# Patient Record
Sex: Male | Born: 1945
Health system: Southern US, Community
[De-identification: ages and names within clinical notes are randomized; demographics above are authoritative.]

## PROBLEM LIST (undated history)

## (undated) DIAGNOSIS — N189 Chronic kidney disease, unspecified: Secondary | ICD-10-CM

## (undated) DIAGNOSIS — K5730 Diverticulosis of large intestine without perforation or abscess without bleeding: Secondary | ICD-10-CM

## (undated) DIAGNOSIS — I10 Essential (primary) hypertension: Secondary | ICD-10-CM

## (undated) DIAGNOSIS — C801 Malignant (primary) neoplasm, unspecified: Secondary | ICD-10-CM

## (undated) DIAGNOSIS — E785 Hyperlipidemia, unspecified: Secondary | ICD-10-CM

## (undated) DIAGNOSIS — J383 Other diseases of vocal cords: Secondary | ICD-10-CM

## (undated) DIAGNOSIS — E119 Type 2 diabetes mellitus without complications: Secondary | ICD-10-CM

## (undated) DIAGNOSIS — R7301 Impaired fasting glucose: Secondary | ICD-10-CM

## (undated) HISTORY — DX: Hyperlipidemia, unspecified: E78.5

## (undated) HISTORY — DX: Malignant (primary) neoplasm, unspecified: C80.1

## (undated) HISTORY — DX: Impaired fasting glucose: R73.01

## (undated) HISTORY — PX: COLONOSCOPY W/ POLYPECTOMY: SHX1380

## (undated) HISTORY — DX: Diverticulosis of large intestine without perforation or abscess without bleeding: K57.30

## (undated) HISTORY — DX: Essential (primary) hypertension: I10

## (undated) MED FILL — Cetuximab IV Soln 200 MG/100ML (2 MG/ML): INTRAVENOUS | Qty: 350 | Status: AC

## (undated) MED FILL — Cetuximab IV Soln 200 MG/100ML (2 MG/ML): INTRAVENOUS | Qty: 200 | Status: AC

---

## 1993-03-27 HISTORY — PX: OTHER SURGICAL HISTORY: SHX169

## 2003-03-28 HISTORY — PX: OTHER SURGICAL HISTORY: SHX169

## 2004-04-20 ENCOUNTER — Ambulatory Visit: Payer: Self-pay | Admitting: Internal Medicine

## 2005-06-15 ENCOUNTER — Ambulatory Visit: Payer: Self-pay | Admitting: Internal Medicine

## 2005-07-17 ENCOUNTER — Encounter (INDEPENDENT_AMBULATORY_CARE_PROVIDER_SITE_OTHER): Payer: Self-pay | Admitting: *Deleted

## 2005-07-17 ENCOUNTER — Ambulatory Visit (HOSPITAL_COMMUNITY): Admission: RE | Admit: 2005-07-17 | Discharge: 2005-07-17 | Payer: Self-pay | Admitting: *Deleted

## 2005-09-01 ENCOUNTER — Ambulatory Visit: Payer: Self-pay | Admitting: Internal Medicine

## 2005-09-08 ENCOUNTER — Ambulatory Visit: Payer: Self-pay | Admitting: Cardiology

## 2006-03-14 ENCOUNTER — Ambulatory Visit: Payer: Self-pay | Admitting: Internal Medicine

## 2006-03-14 LAB — CONVERTED CEMR LAB
BUN: 17 mg/dL (ref 6–23)
Cholesterol: 207 mg/dL (ref 0–200)
LDL DIRECT: 158.1 mg/dL
Triglyceride fasting, serum: 111 mg/dL (ref 0–149)

## 2006-04-04 ENCOUNTER — Ambulatory Visit: Payer: Self-pay | Admitting: Internal Medicine

## 2006-04-13 ENCOUNTER — Ambulatory Visit: Payer: Self-pay | Admitting: Internal Medicine

## 2006-04-13 LAB — CONVERTED CEMR LAB
Total CK: 60 units/L (ref 7–232)
Troponin I: 0.01 ng/mL (ref <0.06)

## 2007-03-08 ENCOUNTER — Ambulatory Visit: Payer: Self-pay | Admitting: Internal Medicine

## 2007-03-08 DIAGNOSIS — E785 Hyperlipidemia, unspecified: Secondary | ICD-10-CM | POA: Insufficient documentation

## 2007-03-08 DIAGNOSIS — Z86010 Personal history of colonic polyps: Secondary | ICD-10-CM

## 2007-03-08 DIAGNOSIS — I10 Essential (primary) hypertension: Secondary | ICD-10-CM

## 2007-03-25 ENCOUNTER — Encounter (INDEPENDENT_AMBULATORY_CARE_PROVIDER_SITE_OTHER): Payer: Self-pay | Admitting: *Deleted

## 2007-04-24 ENCOUNTER — Ambulatory Visit: Payer: Self-pay | Admitting: Internal Medicine

## 2007-04-25 ENCOUNTER — Encounter (INDEPENDENT_AMBULATORY_CARE_PROVIDER_SITE_OTHER): Payer: Self-pay | Admitting: *Deleted

## 2007-07-01 ENCOUNTER — Ambulatory Visit: Payer: Self-pay | Admitting: Internal Medicine

## 2007-07-03 ENCOUNTER — Telehealth (INDEPENDENT_AMBULATORY_CARE_PROVIDER_SITE_OTHER): Payer: Self-pay | Admitting: *Deleted

## 2007-07-09 ENCOUNTER — Telehealth (INDEPENDENT_AMBULATORY_CARE_PROVIDER_SITE_OTHER): Payer: Self-pay | Admitting: *Deleted

## 2007-07-15 ENCOUNTER — Telehealth (INDEPENDENT_AMBULATORY_CARE_PROVIDER_SITE_OTHER): Payer: Self-pay | Admitting: *Deleted

## 2008-01-03 ENCOUNTER — Ambulatory Visit: Payer: Self-pay | Admitting: Internal Medicine

## 2008-01-03 DIAGNOSIS — T783XXA Angioneurotic edema, initial encounter: Secondary | ICD-10-CM | POA: Insufficient documentation

## 2008-01-06 ENCOUNTER — Telehealth: Payer: Self-pay | Admitting: Internal Medicine

## 2008-02-26 ENCOUNTER — Ambulatory Visit: Payer: Self-pay | Admitting: Gastroenterology

## 2008-03-11 ENCOUNTER — Ambulatory Visit: Payer: Self-pay | Admitting: Gastroenterology

## 2008-03-11 LAB — HM COLONOSCOPY

## 2008-11-10 ENCOUNTER — Ambulatory Visit: Payer: Self-pay | Admitting: Internal Medicine

## 2008-11-10 LAB — CONVERTED CEMR LAB
Blood in Urine, dipstick: NEGATIVE
Glucose, Urine, Semiquant: NEGATIVE
Hgb A1c MFr Bld: 5.9 % (ref 4.6–6.5)
Ketones, urine, test strip: NEGATIVE
Protein, U semiquant: NEGATIVE
Specific Gravity, Urine: <1.005
Urobilinogen, UA: 0.2
WBC Urine, dipstick: NEGATIVE

## 2008-11-15 LAB — CONVERTED CEMR LAB
Albumin: 4.2 g/dL (ref 3.5–5.2)
Alkaline Phosphatase: 83 units/L (ref 39–117)
Calcium: 9.2 mg/dL (ref 8.4–10.5)
Chloride: 109 meq/L (ref 96–112)
HCT: 44.3 % (ref 39.0–52.0)
Lymphocytes Relative: 24.5 % (ref 12.0–46.0)
MCHC: 34.2 g/dL (ref 30.0–36.0)
MCV: 90.4 fL (ref 78.0–100.0)
Neutrophils Relative %: 62 % (ref 43.0–77.0)
PSA: 1.59 ng/mL (ref 0.10–4.00)
RBC: 4.9 M/uL (ref 4.22–5.81)
Sodium: 144 meq/L (ref 135–145)
Triglycerides: 136 mg/dL (ref 0.0–149.0)
WBC: 5.8 10*3/uL (ref 4.5–10.5)

## 2008-11-16 ENCOUNTER — Encounter (INDEPENDENT_AMBULATORY_CARE_PROVIDER_SITE_OTHER): Payer: Self-pay | Admitting: *Deleted

## 2008-11-19 ENCOUNTER — Ambulatory Visit: Payer: Self-pay | Admitting: Internal Medicine

## 2008-11-19 DIAGNOSIS — E118 Type 2 diabetes mellitus with unspecified complications: Secondary | ICD-10-CM

## 2008-11-19 DIAGNOSIS — Z87442 Personal history of urinary calculi: Secondary | ICD-10-CM

## 2008-11-19 DIAGNOSIS — K5730 Diverticulosis of large intestine without perforation or abscess without bleeding: Secondary | ICD-10-CM | POA: Insufficient documentation

## 2008-11-19 DIAGNOSIS — Z85828 Personal history of other malignant neoplasm of skin: Secondary | ICD-10-CM

## 2009-12-30 ENCOUNTER — Ambulatory Visit: Payer: Self-pay | Admitting: Internal Medicine

## 2009-12-30 LAB — CONVERTED CEMR LAB
Albumin: 4.2 g/dL (ref 3.5–5.2)
Basophils Absolute: 0 10*3/uL (ref 0.0–0.1)
Basophils Relative: 0.7 % (ref 0.0–3.0)
Blood in Urine, dipstick: NEGATIVE
Direct LDL: 157.4 mg/dL
Glucose, Urine, Semiquant: NEGATIVE
Hemoglobin: 15.2 g/dL (ref 13.0–17.0)
Ketones, urine, test strip: NEGATIVE
Lymphs Abs: 1.5 10*3/uL (ref 0.7–4.0)
Monocytes Absolute: 0.6 10*3/uL (ref 0.1–1.0)
Monocytes Relative: 10.3 % (ref 3.0–12.0)
Neutro Abs: 3.4 10*3/uL (ref 1.4–7.7)
Nitrite: NEGATIVE
PSA: 1.87 ng/mL (ref 0.10–4.00)
Protein, U semiquant: NEGATIVE
RBC: 4.92 M/uL (ref 4.22–5.81)
Specific Gravity, Urine: 1.01
TSH: 1.9 microintl units/mL (ref 0.35–5.50)
Total Bilirubin: 1.1 mg/dL (ref 0.3–1.2)
Total CHOL/HDL Ratio: 6
Total Protein: 6.8 g/dL (ref 6.0–8.3)
WBC Urine, dipstick: NEGATIVE

## 2010-01-07 ENCOUNTER — Encounter: Payer: Self-pay | Admitting: Internal Medicine

## 2010-01-07 ENCOUNTER — Ambulatory Visit: Payer: Self-pay | Admitting: Internal Medicine

## 2010-01-07 DIAGNOSIS — R9431 Abnormal electrocardiogram [ECG] [EKG]: Secondary | ICD-10-CM

## 2010-03-18 ENCOUNTER — Telehealth (INDEPENDENT_AMBULATORY_CARE_PROVIDER_SITE_OTHER): Payer: Self-pay | Admitting: *Deleted

## 2010-04-20 ENCOUNTER — Other Ambulatory Visit: Payer: Self-pay | Admitting: Internal Medicine

## 2010-04-20 ENCOUNTER — Ambulatory Visit
Admission: RE | Admit: 2010-04-20 | Discharge: 2010-04-20 | Payer: Self-pay | Source: Home / Self Care | Attending: Internal Medicine | Admitting: Internal Medicine

## 2010-04-20 LAB — LIPID PANEL
Cholesterol: 172 mg/dL (ref 0–200)
Total CHOL/HDL Ratio: 4
VLDL: 22.4 mg/dL (ref 0.0–40.0)

## 2010-04-20 LAB — HEMOGLOBIN A1C: Hgb A1c MFr Bld: 6.4 % (ref 4.6–6.5)

## 2010-04-20 LAB — HEPATIC FUNCTION PANEL
AST: 25 U/L (ref 0–37)
Bilirubin, Direct: 0.1 mg/dL (ref 0.0–0.3)
Total Protein: 7.1 g/dL (ref 6.0–8.3)

## 2010-04-20 LAB — CK: Total CK: 83 U/L (ref 7–232)

## 2010-04-20 LAB — CREATININE, SERUM: Creatinine, Ser: 1 mg/dL (ref 0.4–1.5)

## 2010-04-20 LAB — POTASSIUM: Potassium: 5 mEq/L (ref 3.5–5.1)

## 2010-04-24 LAB — CONVERTED CEMR LAB
Albumin: 4.1 g/dL (ref 3.5–5.2)
BUN: 18 mg/dL (ref 6–23)
Basophils Absolute: 0 10*3/uL (ref 0.0–0.1)
Basophils Relative: 0.5 % (ref 0.0–1.0)
CO2: 30 meq/L (ref 19–32)
Calcium: 9.4 mg/dL (ref 8.4–10.5)
Cholesterol, target level: 200 mg/dL
Cholesterol: 201 mg/dL (ref 0–200)
Direct LDL: 151.3 mg/dL
Eosinophils Absolute: 0.1 10*3/uL (ref 0.0–0.6)
GFR calc non Af Amer: 80 mL/min
Glucose, Bld: 102 mg/dL — ABNORMAL HIGH (ref 70–99)
HCT: 44.1 % (ref 39.0–52.0)
HDL: 34.5 mg/dL — ABNORMAL LOW (ref >39.0)
LDL Goal: 100 mg/dL
Lymphocytes Relative: 28 % (ref 12.0–46.0)
Monocytes Absolute: 0.4 10*3/uL (ref 0.2–0.7)
RDW: 12.2 % (ref 11.5–14.6)
Sodium: 142 meq/L (ref 135–145)
TSH: 1.9 microintl units/mL (ref 0.35–5.50)
VLDL: 27 mg/dL (ref 0–40)

## 2010-04-27 ENCOUNTER — Ambulatory Visit: Payer: Self-pay | Admitting: Internal Medicine

## 2010-04-28 NOTE — Assessment & Plan Note (Signed)
Summary: CPX,LABS PRIOR,MEDICARE/RH......   Vital Signs:  Patient profile:   65 year old male Height:      68.25 inches Weight:      174.8 pounds BMI:     26.48 Temp:     98.4 degrees F oral Pulse rate:   60 / minute Resp:     14 per minute BP sitting:   130 / 80  (left arm) Cuff size:   large  Vitals Entered By: Shonna Chock CMA (January 07, 2010 11:01 AM) CC: CPX and discuss labs , Lipid Management   CC:  CPX and discuss labs  and Lipid Management.  History of Present Illness: Here for Medicare AWV: 1.Risk factors based on Past M, S, F history:HTN;Dyslipidemia ( chart updated) 2.Physical Activities: walking 45 min 5X/week for 45 min w/o symptoms ( see EKG) 3.Depression/mood: no issues 4.Hearing: normal to whisper @ 6 ft 5.ADL's: no limitations 6.Fall Risk: no imbalance; home safety proofed 7.Home Safety: no issues 8.Height, weight, &visual acuity:read wall chart @ 6 ftw/o lenses 9.Counseling: none requested ;  Living Will & POA  in place   10.Labs ordered based on risk factors: results reviewed & risks discussed 11. Referral Coordination: none requested  12.Care Plan: see Instructions 13. Cognitive Assessment: memory & recall ntact; Oriented X 3 ; mood & affect noraml.Spelling & math reflects educational level. Hyperlipidemia Follow-Up      This is a 65 year old man who presents for Hyperlipidemia follow-up.  The patient denies muscle aches, GI upset, abdominal pain, flushing, itching, constipation, diarrhea, and fatigue.  The patient denies the following symptoms: chest pain/pressure, exercise intolerance, dypsnea, palpitations, syncope, and pedal edema.  Statin ws never started. Adjunctive measures currently used by the patient include ASA. Hypertension Follow-Up      The patient also presents for Hypertension follow-up.  The patient denies lightheadedness and urinary frequency.  Compliance with medications (by patient report) has been near 100%.  Adjunctive measures  currently used by the patient include salt restriction.  BP @ home up to 159/99; BP was normal @ Dentist's office.  Lipid Management History:      Positive NCEP/ATP III risk factors include male age 82 years old or older, HDL cholesterol less than 40, and hypertension.  Negative NCEP/ATP III risk factors include non-diabetic, no family history for ischemic heart disease, non-tobacco-user status, no ASHD (atherosclerotic heart disease), no prior stroke/TIA, no peripheral vascular disease, and no history of aortic aneurysm.     Preventive Screening-Counseling & Management  Alcohol-Tobacco     Alcohol drinks/day: 0     Packs/Day: 2 ppd     Year Started: 1960     Year Quit: 1970  Caffeine-Diet-Exercise     Caffeine use/day: 2-3 cups/ day     Diet Comments: no specific diet  Hep-HIV-STD-Contraception     Dental Visit-last 6 months yes     Sun Exposure-Excessive: no  Safety-Violence-Falls     Seat Belt Use: yes     Smoke Detectors: yes      Blood Transfusions:  no.        Travel History:  no foreign travel.    Allergies: 1)  ! * Unknown Bp Med  Past History:  Past Medical History: Colonic polyps, PMH  of, Dr Jarold Motto Hypertension hematuria X1 ,? due to Nephrolithiasis Diverticulosis, colon Skin cancer, PMH  of, ?Basal Cell, nasal Hyperlipidemia: Fr[place]ham Study LDL goal = < 100.NMR Lipoprofile : LDL 165(2796/2526), HDL 31, TG 202. LDL goal = < 100.  Past Surgical History: cataract left eye benign skin lesions on scalp( pilar cyst) Colon polypectomy 2004,negative  2009, Dr Camille Bal 2015  Family History: mother: breast cancer, htn three brothers :colon cancer brother: diabetes, ? small bowel  cancer,nephrolithiasis sister: diabetes maternal granfather: cva  Social History: Retired;  Former Smoker:quit age 51; Regular exercise-yes walks 2.25- 2.5  most days w/o C-P symptoms; Alcohol use-no 10th grade education, [place] HSPacks/Day:  2 ppd Caffeine  use/day:  2-3 cups/ day Sun Exposure-Excessive:  no Dental Care w/in 6 mos.:  yes Seat Belt Use:  yes Blood Transfusions:  no  Review of Systems  The patient denies anorexia, fever, hoarseness, prolonged cough, hemoptysis, melena, hematochezia, hematuria, suspicious skin lesions, unusual weight change, abnormal bleeding, enlarged lymph nodes, and angioedema.         Weigh up 5#  Physical Exam  General:  well-nourished;alert,appropriate and cooperative throughout examination Head:  Normocephalic and atraumatic without obvious abnormalities.  Eyes:  No corneal or conjunctival inflammation noted.  Perrla. Funduscopic exam benign, without hemorrhages, exudates or papilledema.  Ears:  External ear exam shows no significant lesions or deformities.  Otoscopic examination reveals  some wax bilaterally. Hearing is grossly normal bilaterally. Nose:  External nasal examination shows no deformity or inflammation. Nasal mucosa are pink and moist without lesions or exudates. Mouth:  Oral mucosa and oropharynx without lesions or exudates.  Teeth in good repair. Neck:  No deformities, masses, or tenderness noted. Lungs:  Normal respiratory effort, chest expands symmetrically. Lungs are clear to auscultation, no crackles or wheezes. Heart:  Normal rate and regular rhythm. S1 and S2 normal without gallop, murmur, click, rub.S4 with slurring @ apex Abdomen:  Bowel sounds positive,abdomen soft and non-tender without masses, organomegaly or hernias noted. Prostate:  PSA < 2.00 Pulses:  R and L carotid,radial,dorsalis pedis and posterior tibial pulses are full and equal bilaterally Extremities:  No clubbing, cyanosis, edema, or deformity noted with normal full range of motion of all joints.   Neurologic:  alert & oriented X3 and DTRs symmetrical and normal.   Skin:  Intact without suspicious lesions or rashes Cervical Nodes:  No lymphadenopathy noted Axillary Nodes:  No palpable lymphadenopathy Psych:   memory intact for recent and remote, normally interactive, and good eye contact.     Impression & Recommendations:  Problem # 1:  PREVENTIVE HEALTH CARE (ICD-V70.0)  Orders: Welcome to Medicare, Physical [phone])  Problem # 2:  HYPERTENSION, ESSENTIAL NOS (ICD-401.9)  controlled His updated medication list for this problem includes:    Amlodipine Besylate 5 Mg Tabs (Amlodipine besylate) .Marland Kitchen... 1 once daily    Metoprolol Succinate 25 Mg Xr24h-tab (Metoprolol succinate) .Marland Kitchen... 1 once daily  Orders: EKG w/ Interpretation (93000)  Problem # 3:  HYPERLIPIDEMIA (ICD-272.2)  The following medications were removed from the medication list:    Pravastatin Sodium 20 Mg Tabs (Pravastatin sodium) .Marland Kitchen... 1 at bedtime His updated medication list for this problem includes:    Pravastatin Sodium 40 Mg Tabs (Pravastatin sodium) .Marland Kitchen... 1 at bedtime  Problem # 4:  NONSPECIFIC ABNORMAL ELECTROCARDIOGRAM (ICD-794.31) loss of T voltage lateral leads  Complete Medication List: 1)  Ecasa 81mg   .... Once daily 2)  Amlodipine Besylate 5 Mg Tabs (Amlodipine besylate) .Marland Kitchen.. 1 once daily 3)  Metoprolol Succinate 25 Mg Xr24h-tab (Metoprolol succinate) .Marland Kitchen.. 1 once daily 4)  Pravastatin Sodium 40 Mg Tabs (Pravastatin sodium) .Marland Kitchen.. 1 at bedtime  Other Orders: Pneumococcal Vaccine (60454) Admin 1st Vaccine (09811)  Lipid Assessment/Plan:  Based on NCEP/ATP III, the patient's risk factor category is "0-1 risk factors".  The patient's lipid goals are as follows: Total cholesterol goal is 200; LDL cholesterol goal is 100; HDL cholesterol goal is 40; Triglyceride goal is 150.  His LDL cholesterol goal has not been met.  Secondary causes for hyperlipidemia have been ruled out.  He has been counseled on adjunctive measures for lowering his cholesterol and has been provided with dietary instructions.    Patient Instructions: 1)  Please schedule a follow-up appointment in 3 months. A1c,ICD-9: 790.29; BUN, creat, K+ :  401.9; 2)  Hepatic Panel , CPK prior to visit, ICD-9:995.20 3)  Lipid Panel prior to visit, ICD-9:272.4. 4)  It is important that you exercise regularly at least 20 minutes 5 times a week. If you develop chest pain, have severe difficulty breathing, or feel very tired , stop exercising immediately and seek medical attention. 81 mg coated Aspirin every day.Cholesterol medication is recommended because of specilal lipid testing results suggesting increased long term risk as discussed  & EKG changes.  Prescriptions: PRAVASTATIN SODIUM 40 MG TABS (PRAVASTATIN SODIUM) 1 at bedtime  #90 x 0   Entered and Authorized by:   Marga Melnick MD   Signed by:   Marga Melnick MD on 01/07/2010   Method used:   Print then Give to Patient   RxID:   [id number] METOPROLOL SUCCINATE 25 MG XR24H-TAB (METOPROLOL SUCCINATE) 1 once daily  #90 Each x 3   Entered and Authorized by:   Marga Melnick MD   Signed by:   Marga Melnick MD on 01/07/2010   Method used:   Print then Give to Patient   RxID:   [id number] AMLODIPINE BESYLATE 5 MG TABS (AMLODIPINE BESYLATE) 1 once daily  #90 Each x 3   Entered and Authorized by:   Marga Melnick MD   Signed by:   Marga Melnick MD on 01/07/2010   Method used:   Print then Give to Patient   RxID:   [id number]    Immunizations Administered:  Pneumonia Vaccine:    Vaccine Type: Pneumovax    Site: right deltoid    Mfr: Merck    Dose: 0.5 ml    Route: IM    Given by: Shonna Chock CMA    Exp. Date: 06/13/2011    Lot #: [id number]

## 2010-04-28 NOTE — Progress Notes (Signed)
Summary: Refill Request  Phone Note Refill Request Message from:  Patient  Refills Requested: Medication #1:  METOPROLOL SUCCINATE 25 MG XR24H-TAB 1 once daily  Medication #2:  AMLODIPINE BESYLATE 5 MG TABS 1 once daily [facility] [place]   Method Requested: Fax to Local Pharmacy Initial call taken by: Shonna Chock CMA,  March 18, 2010 10:37 AM    Prescriptions: METOPROLOL SUCCINATE 25 MG XR24H-TAB (METOPROLOL SUCCINATE) 1 once daily  #90 Each x 2   Entered by:   Shonna Chock CMA   Authorized by:   Marga Melnick MD   Signed by:   Shonna Chock CMA on 03/18/2010   Method used:   Electronically to        [facility] [id number] (retail)       [address]       [place], [place]  [address]       Ph: [phone]       Fax: [phone]   RxID:   [id number] AMLODIPINE BESYLATE 5 MG TABS (AMLODIPINE BESYLATE) 1 once daily  #90 Each x 2   Entered by:   Shonna Chock CMA   Authorized by:   Marga Melnick MD   Signed by:   Shonna Chock CMA on 03/18/2010   Method used:   Electronically to        [facility] [id number] (retail)       [address]       [place], [place]  [address]       Ph: [phone]       Fax: [phone]   RxID:   [id number]

## 2010-05-03 ENCOUNTER — Ambulatory Visit (INDEPENDENT_AMBULATORY_CARE_PROVIDER_SITE_OTHER): Payer: No Typology Code available for payment source | Admitting: Internal Medicine

## 2010-05-03 ENCOUNTER — Encounter: Payer: Self-pay | Admitting: Internal Medicine

## 2010-05-03 DIAGNOSIS — E119 Type 2 diabetes mellitus without complications: Secondary | ICD-10-CM

## 2010-05-03 DIAGNOSIS — E782 Mixed hyperlipidemia: Secondary | ICD-10-CM

## 2010-05-03 DIAGNOSIS — I10 Essential (primary) hypertension: Secondary | ICD-10-CM

## 2010-05-12 NOTE — Assessment & Plan Note (Signed)
Summary: 3 MONTH FOLLOWUP///SPH--resch from 2/1////sph  Nurse Visit   Vital Signs:  Patient profile:   65 year old male Weight:      180.9 pounds BMI:     27.40 Pulse rate:   64 / minute Resp:     13 per minute BP sitting:   140 / 80  (left arm) Cuff size:   large  Vitals Entered By: Shonna Chock CMA (May 03, 2010 8:22 AM)  CC:  Follow-up visit: discuss labs (copy given)  and Type 2 diabetes mellitus follow-up.  History of Present Illness:    Labs reviewed & risks discussed; LDL has dropped from 157.4 to 109. A1c 6.4%  which is average  sugar of 137 & 28 % risk.  The patient denies polyuria, polydipsia, blurred vision, self managed hypoglycemia, and numbness of extremities.  The patient denies the following symptoms: neuropathic pain, chest pain, vomiting, orthostatic symptoms, poor wound healing, intermittent claudication, vision loss, and foot ulcer.  Since the last visit the patient reports exercising regularly and not monitoring blood glucose.     Physical Exam  General:  well-nourished,alert,appropriate and cooperative throughout examination Lungs:  Normal respiratory effort, chest expands symmetrically. Lungs are clear to auscultation, no crackles or wheezes. Heart:  normal rate, regular rhythm, no gallop, no rub, no JVD, and grade 1 /6 systolic murmur.   Pulses:  R and L carotid,radial,dorsalis pedis and posterior tibial pulses are full and equal bilaterally Neurologic:  alert & oriented X3 and sensation intact to light touch over feet.   Skin:  Intact without suspicious lesions or rashes Psych:  memory intact for recent and remote, normally interactive, and good eye contact.     Impression & Recommendations:  Problem # 1:  HYPERLIPIDEMIA (ICD-272.2)  His updated medication list for this problem includes:    Pravastatin Sodium 40 Mg Tabs (Pravastatin sodium) .Marland Kitchen... 1 at bedtime  Orders: Prescription Created Electronically [phone])  Problem # 2:  DIABETES MELLITUS  (ICD-250.00)  Problem # 3:  HYPERTENSION, ESSENTIAL NOS (ICD-401.9)  His updated medication list for this problem includes:    Amlodipine Besylate 5 Mg Tabs (Amlodipine besylate) .Marland Kitchen... 1 once daily    Metoprolol Succinate 25 Mg Xr24h-tab (Metoprolol succinate) .Marland Kitchen... 1 once daily  Complete Medication List: 1)  Ecasa 81mg   .... Once daily 2)  Amlodipine Besylate 5 Mg Tabs (Amlodipine besylate) .Marland Kitchen.. 1 once daily 3)  Metoprolol Succinate 25 Mg Xr24h-tab (Metoprolol succinate) .Marland Kitchen.. 1 once daily 4)  Pravastatin Sodium 40 Mg Tabs (Pravastatin sodium) .Marland Kitchen.. 1 at bedtime   CC: Follow-up visit: discuss labs (copy given) , Type 2 diabetes mellitus follow-up   Current Medications (verified): 1)  Ecasa 81mg  .... Once Daily 2)  Amlodipine Besylate 5 Mg Tabs (Amlodipine Besylate) .Marland Kitchen.. 1 Once Daily 3)  Metoprolol Succinate 25 Mg Xr24h-Tab (Metoprolol Succinate) .Marland Kitchen.. 1 Once Daily 4)  Pravastatin Sodium 40 Mg Tabs (Pravastatin Sodium) .Marland Kitchen.. 1 At Bedtime  Allergies: 1)  ! * Unknown Bp Med  Orders Added: 1)  Est. Patient Level III [13086] 2)  Prescription Created Electronically [phone]  Patient Instructions: 1)  See your eye doctor yearly to check for diabetic eye damage. 2)  Check your feet each night for sore areas, calluses or signs of infection. 3)  Check your Blood Pressure regularly. If it is above: 135/85 ON AVERAGE  you should make an appointment. 4)  Please schedule a follow-up appointment in 4 months. 5)  HbgA1C prior to visit, ICD-9:250.00 6)  Urine Microalbumin  prior to visit, ICD-9:250.00  Prescriptions: PRAVASTATIN SODIUM 40 MG TABS (PRAVASTATIN SODIUM) 1 at bedtime  #90 x 3   Entered and Authorized by:   Marga Melnick MD   Signed by:   Marga Melnick MD on 05/03/2010   Method used:   Electronically to        [facility] [id number]* (retail)       [address]       [place], [place]  [address]       Ph: [phone]       Fax: [phone]   RxID:   [id number]

## 2010-08-12 NOTE — Op Note (Signed)
Jerry Palmer, Jerry Palmer               ACCOUNT NO.:  [id number]   MEDICAL RECORD NO.:  [id number]          PATIENT TYPE:  AMB   LOCATION:  DAY                          FACILITY:  [facility]   PHYSICIAN:  Alfonse Ras, MD   DATE OF BIRTH:  [birth year]   DATE OF PROCEDURE:  07/17/2005  DATE OF DISCHARGE:                                 OPERATIVE REPORT   PREOPERATIVE DIAGNOSIS:  Bilateral scalp epidermoid cyst.   POSTOPERATIVE DIAGNOSIS:  Bilateral scalp epidermoid cyst.   PROCEDURE:  Excision of bilateral epidermoid cyst of the scalp.   ANESTHESIA:  MAC.   SURGEON:  Alfonse Ras, M.D.   DESCRIPTION:  The patient was taken to the operating room and placed in a  supine position. After adequate anesthesia was induced using MAC technique,  the scalp was shaved in the areas of the epidermoid cyst and prepped and  draped in the normal sterile fashion. First, the area over the left scalp  epidermoid cyst in the parietal region was anesthetized with 1% lidocaine  with epinephrine. An incision was made over it. The cyst was very close to  the dermis. It was excised in its entirety. Adequate hemostasis was assured  with Bovie electrocautery, and the wound was closed with subcuticular 3-0  Monocryl. Identical procedure was performed on the right side of scalp. Both  specimens were sent for pathology. Sterile dressings were applied. Dermabond  was applied first, and then dressings were applied. The patient tolerated  the procedure well and went to PACU in good condition.      Alfonse Ras, MD  Electronically Signed     KRE/MEDQ  D:  07/17/2005  T:  07/18/2005  Job:  [id number]

## 2010-12-15 ENCOUNTER — Other Ambulatory Visit: Payer: Self-pay | Admitting: Internal Medicine

## 2011-03-07 ENCOUNTER — Encounter: Payer: Self-pay | Admitting: Internal Medicine

## 2011-03-09 ENCOUNTER — Encounter: Payer: Self-pay | Admitting: Internal Medicine

## 2011-03-09 ENCOUNTER — Ambulatory Visit (INDEPENDENT_AMBULATORY_CARE_PROVIDER_SITE_OTHER): Payer: No Typology Code available for payment source | Admitting: Internal Medicine

## 2011-03-09 VITALS — BP 138/80 | HR 85 | Temp 98.0°F | Resp 14 | Ht 68.25 in | Wt 178.6 lb

## 2011-03-09 DIAGNOSIS — Z860100 Personal history of colon polyps, unspecified: Secondary | ICD-10-CM

## 2011-03-09 DIAGNOSIS — E119 Type 2 diabetes mellitus without complications: Secondary | ICD-10-CM

## 2011-03-09 DIAGNOSIS — Z0000 Encounter for general adult medical examination without abnormal findings: Secondary | ICD-10-CM

## 2011-03-09 DIAGNOSIS — Z23 Encounter for immunization: Secondary | ICD-10-CM

## 2011-03-09 DIAGNOSIS — I10 Essential (primary) hypertension: Secondary | ICD-10-CM

## 2011-03-09 DIAGNOSIS — R9431 Abnormal electrocardiogram [ECG] [EKG]: Secondary | ICD-10-CM

## 2011-03-09 DIAGNOSIS — E782 Mixed hyperlipidemia: Secondary | ICD-10-CM

## 2011-03-09 LAB — CBC WITH DIFFERENTIAL/PLATELET
Basophils Absolute: 0 10*3/uL (ref 0.0–0.1)
HCT: 45.9 % (ref 39.0–52.0)
Hemoglobin: 15.7 g/dL (ref 13.0–17.0)
Lymphocytes Relative: 24.5 % (ref 12.0–46.0)
Lymphs Abs: 1.3 10*3/uL (ref 0.7–4.0)
MCHC: 34.3 g/dL (ref 30.0–36.0)
MCV: 90.2 fl (ref 78.0–100.0)
Monocytes Relative: 7.8 % (ref 3.0–12.0)
Platelets: 147 10*3/uL — ABNORMAL LOW (ref 150.0–400.0)
WBC: 5.3 10*3/uL (ref 4.5–10.5)

## 2011-03-09 LAB — HEPATIC FUNCTION PANEL
ALT: 28 U/L (ref 0–53)
Total Protein: 7.5 g/dL (ref 6.0–8.3)

## 2011-03-09 LAB — MICROALBUMIN / CREATININE URINE RATIO
Creatinine,U: 160.3 mg/dL
Microalb Creat Ratio: 0.3 mg/g (ref 0.0–30.0)

## 2011-03-09 LAB — LIPID PANEL
HDL: 47.8 mg/dL (ref >39.00)
LDL Cholesterol: 88 mg/dL (ref 0–99)
Total CHOL/HDL Ratio: 3
Triglycerides: 99 mg/dL (ref 0.0–149.0)
VLDL: 19.8 mg/dL (ref 0.0–40.0)

## 2011-03-09 LAB — BASIC METABOLIC PANEL
BUN: 18 mg/dL (ref 6–23)
Chloride: 108 mEq/L (ref 96–112)
Glucose, Bld: 105 mg/dL — ABNORMAL HIGH (ref 70–99)

## 2011-03-09 LAB — TSH: TSH: 2.33 u[IU]/mL (ref 0.35–5.50)

## 2011-03-09 NOTE — Assessment & Plan Note (Signed)
He has controlled his sugar with diet and exercise; A1c was 6.4% in January of this year indicating excellent control. He is on no medications for diabetes. He has had his annual ophthalmologic exam; he has not seen a podiatrist. Sensation over  the feet was checked & was normal. Nail health is also excellent. Labs will be updated to include a urine microalbumin.

## 2011-03-09 NOTE — Assessment & Plan Note (Signed)
Past history of colon polyp on one occasion with a strong family history of colon cancer in 3 brothers. He has no active GI symptoms.  Verification of need for followup with GI will be pursued.

## 2011-03-09 NOTE — Assessment & Plan Note (Signed)
Blood pressure control appears to be adequate; monitoring goals were discussed. He is having no hypertensive symptoms.

## 2011-03-09 NOTE — Progress Notes (Signed)
Subjective:    Patient ID: Jerry Palmer, male    DOB: 11/18/1945, 65 y.o.   MRN: [id number]  HPI  Medicare Wellness Visit:  The following psychosocial & medical history were reviewed as required by Medicare.   Social history: caffeine: 4-5 cups coffee& 1 diet cola / day , alcohol:  no ,  tobacco use : quit 1980  & exercise : walking 2 mpd.   Home & personal  safety / fall risk: no, activities of daily living: no limitations , seatbelt use : yes , and smoke alarm employment : yes .  Power of Attorney/Living Will status : in place  Vision ( as recorded per Nurse) & Hearing  evaluation :  Ophth seen 11/12, no change. Wall chart read & whisper heard @ 6 ft Orientation :oriented X 3 , memory & recall :good,  math testing: good,and mood & affect : normal  . Depression / anxiety: denied Travel history : never , immunization status :Flu given today , transfusion history:  no, and preventive health surveillance ( colonoscopies, BMD , etc as per protocol/ [facility]): colonoscopy ? due, Dental care:seen every 6 mos   . Chart reviewed &  Updated. Active issues reviewed & addressed.       Review of Systems HYPERTENSION: Disease Monitoring: Blood pressure range-not monitored  Chest pain, palpitations- no       Dyspnea- no Medications: Compliance- yes  Lightheadedness,Syncope- no    Edema- no  FASTING HYPERGLYCEMIA: Disease Monitoring: Blood Sugar ranges-not monitored  Polyuria/phagia/dipsia- no       Visual problems- only stable cataract OD Diet: decreased sugar  HYPERLIPIDEMIA: Disease Monitoring: See symptoms for Hypertension Medications: Compliance- yes  Abd pain, bowel changes- no   Muscle aches- no       Objective:   Physical Exam Gen.: Thin but healthy and well-nourished in appearance. Alert, appropriate and cooperative throughout exam. Head: Normocephalic without obvious abnormalities Eyes: No corneal or conjunctival inflammation noted. Pupils equal round reactive to light and  accommodation.  Extraocular motion intact.  Ears: External  ear exam reveals no significant lesions or deformities. Canals clear .TMs normal.  Nose: External nasal exam reveals no deformity or inflammation. Nasal mucosa are pink and moist. No lesions or exudates noted.  Mouth: Oral mucosa and oropharynx reveal no lesions or exudates. Teeth in good repair. Neck: No deformities, masses, or tenderness noted. Range of motion &. Thyroid normal. Lungs: Normal respiratory effort; chest expands symmetrically. Lungs are clear to auscultation without rales, wheezes, or increased work of breathing. Heart: Normal rate and rhythm. Normal S1 and S2. No gallop, click, or rub. S 4 with slurring; no murmur. Abdomen: Bowel sounds normal; abdomen soft and nontender. No masses, organomegaly or hernias noted. Genitalia/DRE: Left scrotal varices; prostate is normal without enlargement, nodularity, asymmetry, or induration.                                                                               Musculoskeletal/extremities: No deformity or scoliosis noted of  the thoracic or lumbar spine. No clubbing, cyanosis, edema, or deformity noted. Range of motion  normal .Tone & strength  normal.Joints normal. Nail health  good. Vascular: Carotid, radial artery, dorsalis pedis  and  posterior tibial pulses are full and equal. No bruits present. Neurologic: Alert and oriented x3. Deep tendon reflexes symmetrical and normal.          Skin: Intact without suspicious lesions or rashes. Lymph: No cervical, axillary, or inguinal lymphadenopathy present. Psych: Mood and affect are normal. Normally interactive                                                                                         Assessment & Plan:  y

## 2011-03-09 NOTE — Assessment & Plan Note (Signed)
He is compliant with his statin and has no adverse medications symptoms. Labs will be checked.

## 2011-03-09 NOTE — Assessment & Plan Note (Signed)
EKG is normal. There are no ischemic or hypertensive changes present.

## 2011-03-09 NOTE — Patient Instructions (Signed)
Eat a low-fat diet with lots of fruits and vegetables, up to 7-9 servings per day. Avoid obesity; your goal is waist measurement < 40 inches.Consume less than 40 grams of sugar per day from foods & drinks with High Fructose Corn Sugar as #1,2,3 or # 4 on label. Follow the low carb nutrition program in [employer] as closely as possible to prevent Diabetes progression & complications. White carbohydrates (potatoes, rice, bread, and pasta) have a high spike of sugar and a high load of sugar. For example a  baked potato has a cup of sugar and a  french fry  2 teaspoons of sugar. Yams, wild  rice, whole grained bread &  wheat pasta have been much lower spike and load of  sugar. Portions should be the size of a deck of cards or your palm.  Exercise at least 30-45 minutes a day,  3-4 days a week.   Annual podiatry (ifskin or nail changes ) and retinal assessments are indicated because the diabetes.

## 2011-03-23 ENCOUNTER — Other Ambulatory Visit: Payer: Self-pay | Admitting: Internal Medicine

## 2011-05-10 ENCOUNTER — Other Ambulatory Visit: Payer: Self-pay | Admitting: Internal Medicine

## 2011-09-20 ENCOUNTER — Other Ambulatory Visit: Payer: Self-pay | Admitting: Internal Medicine

## 2012-01-30 ENCOUNTER — Other Ambulatory Visit: Payer: Self-pay | Admitting: Internal Medicine

## 2012-02-16 ENCOUNTER — Other Ambulatory Visit (INDEPENDENT_AMBULATORY_CARE_PROVIDER_SITE_OTHER): Payer: No Typology Code available for payment source

## 2012-02-16 DIAGNOSIS — Z1289 Encounter for screening for malignant neoplasm of other sites: Secondary | ICD-10-CM

## 2012-03-12 ENCOUNTER — Ambulatory Visit (INDEPENDENT_AMBULATORY_CARE_PROVIDER_SITE_OTHER): Payer: No Typology Code available for payment source | Admitting: Internal Medicine

## 2012-03-12 ENCOUNTER — Encounter: Payer: Self-pay | Admitting: Internal Medicine

## 2012-03-12 VITALS — BP 136/90 | HR 76 | Temp 98.0°F | Resp 12 | Ht 68.5 in | Wt 180.8 lb

## 2012-03-12 DIAGNOSIS — Z23 Encounter for immunization: Secondary | ICD-10-CM

## 2012-03-12 DIAGNOSIS — D126 Benign neoplasm of colon, unspecified: Secondary | ICD-10-CM

## 2012-03-12 DIAGNOSIS — I10 Essential (primary) hypertension: Secondary | ICD-10-CM

## 2012-03-12 DIAGNOSIS — Z0000 Encounter for general adult medical examination without abnormal findings: Secondary | ICD-10-CM

## 2012-03-12 DIAGNOSIS — E785 Hyperlipidemia, unspecified: Secondary | ICD-10-CM

## 2012-03-12 DIAGNOSIS — R7309 Other abnormal glucose: Secondary | ICD-10-CM

## 2012-03-12 LAB — CBC WITH DIFFERENTIAL/PLATELET
Basophils Absolute: 0 10*3/uL (ref 0.0–0.1)
Eosinophils Absolute: 0.2 10*3/uL (ref 0.0–0.7)
Lymphocytes Relative: 19.7 % (ref 12.0–46.0)
MCHC: 34 g/dL (ref 30.0–36.0)
MCV: 88.3 fl (ref 78.0–100.0)
Monocytes Absolute: 0.4 10*3/uL (ref 0.1–1.0)
Neutrophils Relative %: 69.3 % (ref 43.0–77.0)
Platelets: 147 10*3/uL — ABNORMAL LOW (ref 150.0–400.0)
RBC: 5.3 Mil/uL (ref 4.22–5.81)
RDW: 13.2 % (ref 11.5–14.6)

## 2012-03-12 LAB — BASIC METABOLIC PANEL
BUN: 13 mg/dL (ref 6–23)
CO2: 26 mEq/L (ref 19–32)
Calcium: 9.4 mg/dL (ref 8.4–10.5)
Chloride: 105 mEq/L (ref 96–112)
Creatinine, Ser: 1 mg/dL (ref 0.4–1.5)

## 2012-03-12 LAB — LIPID PANEL
Cholesterol: 167 mg/dL (ref 0–200)
HDL: 40.4 mg/dL (ref >39.00)
LDL Cholesterol: 99 mg/dL (ref 0–99)
Total CHOL/HDL Ratio: 4
Triglycerides: 136 mg/dL (ref 0.0–149.0)
VLDL: 27.2 mg/dL (ref 0.0–40.0)

## 2012-03-12 LAB — HEPATIC FUNCTION PANEL
ALT: 30 U/L (ref 0–53)
Bilirubin, Direct: 0.1 mg/dL (ref 0.0–0.3)
Total Bilirubin: 1 mg/dL (ref 0.3–1.2)

## 2012-03-12 LAB — HEMOGLOBIN A1C: Hgb A1c MFr Bld: 6.5 % (ref 4.6–6.5)

## 2012-03-12 MED ORDER — PRAVASTATIN SODIUM 40 MG PO TABS: ORAL_TABLET | ORAL | Status: DC

## 2012-03-12 MED ORDER — METOPROLOL SUCCINATE ER 25 MG PO TB24: ORAL_TABLET | ORAL | Status: DC

## 2012-03-12 MED ORDER — AMLODIPINE BESYLATE 5 MG PO TABS: ORAL_TABLET | ORAL | Status: DC

## 2012-03-12 NOTE — Patient Instructions (Addendum)
Take the EKG to any emergency room or preop visits. There are MINOR  nonspecific changes in 1 & aVL; as long as there is no new change these are not clinically significant.  Miralax every 3 rd day as needed for constipation.  Please verify when follow up colonoscopy is due as per Dr Jarold Motto   If you activate My Chart; the results can be released to you as soon as they populate from the lab. If you choose not to use this program; the labs have to be reviewed, copied & mailed causing a delay in getting the results to you.

## 2012-03-12 NOTE — Progress Notes (Signed)
Subjective:    Patient ID: Jerry Palmer, male    DOB: 25-Nov-1945, 66 y.o.   MRN: [id number]  HPI Medicare Wellness Visit:  The following psychosocial & medical history were reviewed as required by Medicare.   Social history: caffeine: 3-4 coffee , alcohol:  none ,  tobacco use : quit 1980  & exercise : walking 3X/ week or >.   Home & personal  safety / fall risk: no issue, activities of daily living: no limitations , seatbelt use :yes , and smoke alarm employment : yes .  Power of Attorney/Living Will status : in place  Vision ( as recorded per Nurse) & Hearing  evaluation : Ophth exam 02/06/11.No hearing exam Orientation :oriented X3 , memory & recall :good, spelling or math testing: good,and mood & affect : good . Depression / anxiety: denied Travel history : no foreign travel , immunization status :Flu shot today , transfusion history:  no, and preventive health surveillance ( colonoscopies, BMD , etc as per protocol/ SOC): ? Colonoscopy due, Dental care:  Every 6 mos . Chart reviewed &  Updated. Active issues reviewed & addressed.       Review of Systems HYPERTENSION: Disease Monitoring: Blood pressure range-not recorded   Chest pain, palpitations- no      Dyspnea- no Medications: Compliance- yes  Lightheadedness,Syncope-no    Edema- no  FASTING HYPERGLYCEMIA, PMH of:  Disease Monitoring: Blood Sugar ranges-no monitor Polyuria/phagia/dipsia- no       Visual problems-no Diet:no specific plan  HYPERLIPIDEMIA: Disease Monitoring: See symptoms for Hypertension Medications: Compliance- yes Abd pain, bowel changes-BMs every 2-3 days   Muscle aches- no        Objective:   Physical Exam Gen.:  well-nourished in appearance. Alert, appropriate and cooperative throughout exam. Head: Normocephalic without obvious abnormalities  Eyes: No corneal or conjunctival inflammation noted. Pupils equal round reactive to light and accommodation. Fundal exam is benign without  hemorrhages, exudate, papilledema. Extraocular motion intact. Vision grossly normal. Ears: External  ear exam reveals no significant lesions or deformities. Canals clear .TMs normal. Hearing is grossly normal bilaterally. Nose: External nasal exam reveals no deformity or inflammation. Nasal mucosa are pink and moist. No lesions or exudates noted.  Mouth: Oral mucosa and oropharynx reveal no lesions or exudates. Teeth in good repair. Neck: No deformities, masses, or tenderness noted. Range of motion & Thyroid normal. Lungs: Normal respiratory effort; chest expands symmetrically. Lungs are clear to auscultation without rales, wheezes, or increased work of breathing. Heart: Normal rate and rhythm. Normal S1 and S2. No gallop, click, or rub. S 4 w/o murmur. Abdomen: Bowel sounds normal; abdomen soft and nontender. No masses,or organomegaly. No hernias noted but weakness R inguinal area. Genitalia/DRE: Genitalia normal except for small left varices. Prostate is normal without enlargement, asymmetry, nodularity, or induration.  Musculoskeletal/extremities: No deformity or scoliosis noted of  the thoracic or lumbar spine. No clubbing, cyanosis, edema, or deformity noted. Range of motion  normal .Tone & strength  normal.Joints normal. Nail health  good. Vascular: Carotid, radial artery, dorsalis pedis and  posterior tibial pulses are full and equal. No bruits present. Neurologic: Alert and oriented x3. Deep tendon reflexes symmetrical and normal.          Skin: Intact without suspicious lesions or rashes. Lymph: No cervical, axillary, or inguinal lymphadenopathy present. Psych: Mood and affect are normal. Normally interactive  Assessment & Plan:  #1 Medicare Wellness Exam; criteria met ; data entered #2Problem List, past medical history/family history/social history were all reviewed and updated.   Plan: see  Orders

## 2012-03-21 ENCOUNTER — Other Ambulatory Visit: Payer: Self-pay | Admitting: Internal Medicine

## 2012-03-26 ENCOUNTER — Telehealth: Payer: Self-pay | Admitting: Internal Medicine

## 2012-03-26 DIAGNOSIS — E785 Hyperlipidemia, unspecified: Secondary | ICD-10-CM

## 2012-03-26 DIAGNOSIS — I10 Essential (primary) hypertension: Secondary | ICD-10-CM

## 2012-03-26 NOTE — Telephone Encounter (Signed)
RX's were printed off and given to patient on 03/12/12, I left message on VM informing Jerry Palmer rx's need to be presented to the pharmacy and they will fill rx's as written. I instructed Jerry Palmer to call if questions or concerns

## 2012-03-26 NOTE — Telephone Encounter (Signed)
Patient's wife states they were here at the office on 03/12/12 and we sent new rxs to [facility] in [place]. Patient's wife states the pharmacy never received these and they need them sent over ASAP.

## 2012-03-28 MED ORDER — AMLODIPINE BESYLATE 5 MG PO TABS: ORAL_TABLET | ORAL | Status: DC

## 2012-03-28 MED ORDER — METOPROLOL SUCCINATE ER 25 MG PO TB24: ORAL_TABLET | ORAL | Status: DC

## 2012-03-28 MED ORDER — PRAVASTATIN SODIUM 40 MG PO TABS: ORAL_TABLET | ORAL | Status: DC

## 2012-03-28 NOTE — Addendum Note (Signed)
Addended by: Maurice Small on: 03/28/2012 08:45 AM   Modules accepted: Orders

## 2012-03-28 NOTE — Telephone Encounter (Signed)
Patient's wife called back stating she has the prescriptions in hand, but they are not signed by Dr. Alwyn Ren. Please send meds to [facility] in [place].

## 2012-03-28 NOTE — Telephone Encounter (Signed)
RX manually faxed to requested pharmacy

## 2013-01-06 ENCOUNTER — Encounter: Payer: Self-pay | Admitting: Gastroenterology

## 2013-01-15 ENCOUNTER — Ambulatory Visit (INDEPENDENT_AMBULATORY_CARE_PROVIDER_SITE_OTHER): Payer: Medicare Other

## 2013-01-15 DIAGNOSIS — Z23 Encounter for immunization: Secondary | ICD-10-CM

## 2013-03-27 ENCOUNTER — Other Ambulatory Visit: Payer: Self-pay | Admitting: Internal Medicine

## 2013-03-28 NOTE — Telephone Encounter (Signed)
Amlodipine and Metoprolol refilled per protocol. JG//CMA

## 2013-04-14 ENCOUNTER — Telehealth: Payer: Self-pay

## 2013-04-14 NOTE — Telephone Encounter (Signed)
Medication List and allergies:  Updated and Reviewed  90 day supply/mail order: n/a Local prescriptions: [facility] [employer] due:  Shingles vaccine?  A/P: No changes to personal, family or [place] Flu- 12/2012 Tdap- 10/2008 PNA- 12/2009 Shingles- Pt. Has had shingles, unsure about vaccine.   CCS- 02/2008-due-family hx. PSA- 12/2009, 1.59   To discuss with provider:  Not at this time.

## 2013-04-14 NOTE — Telephone Encounter (Signed)
Left message for call back Non identifiable  Flu vaccine--12/2012 Tdap--10/2008 PNA--12/2009 CCS--02/2008--due--family hx PSA--12/2009  1.59

## 2013-04-16 ENCOUNTER — Ambulatory Visit (INDEPENDENT_AMBULATORY_CARE_PROVIDER_SITE_OTHER): Payer: Medicare Other | Admitting: Internal Medicine

## 2013-04-16 ENCOUNTER — Encounter: Payer: Self-pay | Admitting: Internal Medicine

## 2013-04-16 VITALS — BP 135/81 | HR 77 | Temp 98.4°F | Ht 68.5 in | Wt 180.8 lb

## 2013-04-16 DIAGNOSIS — Z86010 Personal history of colonic polyps: Secondary | ICD-10-CM

## 2013-04-16 DIAGNOSIS — Z0000 Encounter for general adult medical examination without abnormal findings: Secondary | ICD-10-CM

## 2013-04-16 DIAGNOSIS — I10 Essential (primary) hypertension: Secondary | ICD-10-CM

## 2013-04-16 DIAGNOSIS — E782 Mixed hyperlipidemia: Secondary | ICD-10-CM

## 2013-04-16 DIAGNOSIS — N429 Disorder of prostate, unspecified: Secondary | ICD-10-CM

## 2013-04-16 DIAGNOSIS — E785 Hyperlipidemia, unspecified: Secondary | ICD-10-CM

## 2013-04-16 DIAGNOSIS — R7309 Other abnormal glucose: Secondary | ICD-10-CM

## 2013-04-16 LAB — LIPID PANEL
CHOL/HDL RATIO: 4
Cholesterol: 168 mg/dL (ref 0–200)
HDL: 41 mg/dL (ref >39.00)
LDL Cholesterol: 97 mg/dL (ref 0–99)
Triglycerides: 149 mg/dL (ref 0.0–149.0)
VLDL: 29.8 mg/dL (ref 0.0–40.0)

## 2013-04-16 LAB — CBC WITH DIFFERENTIAL/PLATELET
BASOS ABS: 0 10*3/uL (ref 0.0–0.1)
Basophils Relative: 0.3 % (ref 0.0–3.0)
Eosinophils Absolute: 0.2 10*3/uL (ref 0.0–0.7)
Eosinophils Relative: 2.3 % (ref 0.0–5.0)
HCT: 46.3 % (ref 39.0–52.0)
HEMOGLOBIN: 15.8 g/dL (ref 13.0–17.0)
LYMPHS PCT: 15.3 % (ref 12.0–46.0)
Lymphs Abs: 1.1 10*3/uL (ref 0.7–4.0)
MCHC: 34.1 g/dL (ref 30.0–36.0)
MCV: 89.1 fl (ref 78.0–100.0)
MONOS PCT: 11.3 % (ref 3.0–12.0)
Monocytes Absolute: 0.8 10*3/uL (ref 0.1–1.0)
NEUTROS ABS: 5.2 10*3/uL (ref 1.4–7.7)
Neutrophils Relative %: 70.8 % (ref 43.0–77.0)
Platelets: 139 10*3/uL — ABNORMAL LOW (ref 150.0–400.0)
RBC: 5.19 Mil/uL (ref 4.22–5.81)
RDW: 13.5 % (ref 11.5–14.6)
WBC: 7.3 10*3/uL (ref 4.5–10.5)

## 2013-04-16 LAB — BASIC METABOLIC PANEL
BUN: 14 mg/dL (ref 6–23)
CALCIUM: 9.2 mg/dL (ref 8.4–10.5)
CO2: 25 mEq/L (ref 19–32)
Chloride: 105 mEq/L (ref 96–112)
Creatinine, Ser: 1.1 mg/dL (ref 0.4–1.5)
GFR: 71.27 mL/min (ref >60.00)
GLUCOSE: 109 mg/dL — AB (ref 70–99)
Potassium: 4 mEq/L (ref 3.5–5.1)
SODIUM: 138 meq/L (ref 135–145)

## 2013-04-16 LAB — HEPATIC FUNCTION PANEL
ALBUMIN: 4.5 g/dL (ref 3.5–5.2)
ALK PHOS: 99 U/L (ref 39–117)
ALT: 26 U/L (ref 0–53)
AST: 26 U/L (ref 0–37)
Bilirubin, Direct: 0.1 mg/dL (ref 0.0–0.3)
Total Bilirubin: 1 mg/dL (ref 0.3–1.2)
Total Protein: 7.5 g/dL (ref 6.0–8.3)

## 2013-04-16 LAB — TSH: TSH: 2.22 u[IU]/mL (ref 0.35–5.50)

## 2013-04-16 LAB — PSA: PSA: 1.9 ng/mL (ref 0.10–4.00)

## 2013-04-16 LAB — HEMOGLOBIN A1C: Hgb A1c MFr Bld: 6.3 % (ref 4.6–6.5)

## 2013-04-16 MED ORDER — AMLODIPINE BESYLATE 5 MG PO TABS: ORAL_TABLET | ORAL | Status: DC

## 2013-04-16 MED ORDER — METOPROLOL SUCCINATE ER 25 MG PO TB24: ORAL_TABLET | ORAL | Status: DC

## 2013-04-16 MED ORDER — PRAVASTATIN SODIUM 40 MG PO TABS: ORAL_TABLET | ORAL | Status: DC

## 2013-04-16 NOTE — Progress Notes (Signed)
Subjective:    Patient ID: Jerry Palmer, male    DOB: 07-15-1945, 68 y.o.   MRN: [id number]  HPI  Medicare Wellness Visit: Psychosocial and medical history were reviewed as required by Medicare (history related to abuse, antisocial behavior , firearm risk). Social history: Caffeine:3 cups coffee/day  , Alcohol: no , Tobacco SXJ:DBZM 1980 Exercise:irregular  But walks 2 mpd 2-3 X / week Personal safety/fall risk:no Limitations of activities of daily living:no Seatbelt/ smoke alarm use:yes Healthcare Power of Attorney/Living Will status: in place Ophthalmologic exam status:not current Hearing evaluation status:not current Orientation: Oriented X# Memory and recall: good Math testing: good Depression/anxiety assessment: no Foreign travel history:never Immunization status for influenza/pneumonia/ shingles /tetanus:Shingles needed Transfusion history:no Preventive health care maintenance status: Colonoscopy as per protocol/standard care:due Dental care:every 6 mos Chart reviewed and updated. Active issues reviewed and addressed as documented below.   Review of Systems Blood pressure  average 135/80 Compliant with anti hypertemsive medication. No lightheadedness or other adverse medication effect described.  Significant headaches, epistaxis, chest pain, palpitations, exertional dyspnea, claudication, paroxysmal nocturnal dyspnea, or edema absent.  He has since been exposed to some dust with associated unilateral rhinitis and also sneezing recently. There's been no purulence, fever, chills, or sweats .      Objective:   Physical Exam Gen.: Healthy and well-nourished in appearance. Alert, appropriate and cooperative throughout exam.Appears younger than stated age  Head: Normocephalic without obvious abnormalities  Eyes: No corneal or conjunctival inflammation noted. Pupils equal round reactive to light and accommodation. Extraocular motion intact. .  Vision grossly normal without  lenses Ears: External  ear exam reveals no significant lesions or deformities. Canals clear .TMs normal. Hearing is grossly normal bilaterally. Nose: External nasal exam reveals no deformity or inflammation. Nasal mucosa are pink and moist. No lesions or exudates noted.  Mouth: Oral mucosa and oropharynx reveal no lesions or exudates. Teeth in good repair. Neck: No deformities, masses, or tenderness noted. Range of motion & Thyroid normal. Lungs: Normal respiratory effort; chest expands symmetrically. Lungs are clear to auscultation without rales, wheezes, or increased work of breathing. Heart: Normal rate and rhythm. Normal S1 and S2. No gallop, click, or rub. No murmur. Abdomen: Bowel sounds normal; abdomen soft and nontender. No masses, organomegaly or hernias noted. Genitalia: Genitalia normal except for left varices. Prostate is slightly  Asymmetric & ULN to mildly enlarged.             Musculoskeletal/extremities: No deformity or scoliosis noted of  the thoracic or lumbar spine.   No clubbing, cyanosis, edema, or significant extremity  deformity noted. Range of motion normal .Tone & strength normal. Hand joints reveal mild  DJD DIP changes. Fingernail health good. Able to lie down & sit up w/o help. Negative SLR bilaterally Vascular: Carotid, radial artery, dorsalis pedis and  posterior tibial pulses are full and equal. No bruits present. Neurologic: Alert and oriented x3. Deep tendon reflexes symmetrical and normal.  Skin: Intact without suspicious lesions or rashes. Lymph: No cervical, axillary, or inguinal lymphadenopathy present. Psych: Mood and affect are normal. Normally interactive  Assessment & Plan:  #1 Medicare Wellness Exam; criteria met ; data entered #2 Problem List/Diagnoses reviewed Plan:  Assessments made/ Orders entered  

## 2013-04-16 NOTE — Patient Instructions (Signed)
Your next office appointment will be determined based upon review of your pending labs &/ or x-rays. Those instructions will be transmitted to you through My Chart  or by mail if you're not using this system.  Plain Mucinex (NOT D) for thick secretions ;force NON dairy fluids .   Nasal cleansing in the shower as discussed with lather of mild shampoo.After 10 seconds wash off lather while  exhaling through nostrils. Make sure that all residual soap is removed to prevent irritation.  Nasacort AQ OTC 1 spray in each nostril twice a day as needed. Use the "crossover" technique into opposite nostril spraying toward opposite ear @ 45 degree angle, not straight up into nostril.  Use a Neti pot daily only  as needed for significant sinus congestion; going from open side to congested side . Plain Allegra (NOT D )  160 daily , Loratidine 10 mg , OR Zyrtec 10 mg @ bedtime  as needed for itchy eyes & sneezing. Followup as needed for your this acute issue. Please report any significant change in your symptoms.

## 2013-04-16 NOTE — Progress Notes (Signed)
Pre visit review using our clinic review tool, if applicable. No additional management support is needed unless otherwise documented below in the visit note. 

## 2013-04-22 ENCOUNTER — Encounter: Payer: Self-pay | Admitting: *Deleted

## 2013-04-23 ENCOUNTER — Ambulatory Visit (AMBULATORY_SURGERY_CENTER): Payer: Self-pay | Admitting: *Deleted

## 2013-04-23 VITALS — Ht 68.5 in | Wt 184.0 lb

## 2013-04-23 DIAGNOSIS — Z800 Family history of malignant neoplasm of digestive organs: Secondary | ICD-10-CM

## 2013-04-23 MED ORDER — MOVIPREP 100 G PO SOLR: ORAL | Status: DC

## 2013-04-23 NOTE — Progress Notes (Signed)
No allergies to eggs or soy. No problems with anesthesia.  

## 2013-04-29 ENCOUNTER — Encounter: Payer: Self-pay | Admitting: Gastroenterology

## 2013-05-07 ENCOUNTER — Encounter: Payer: Self-pay | Admitting: Gastroenterology

## 2013-05-07 ENCOUNTER — Ambulatory Visit (AMBULATORY_SURGERY_CENTER): Payer: Medicare Other | Admitting: Gastroenterology

## 2013-05-07 VITALS — BP 150/100 | HR 90 | Temp 97.8°F | Ht 68.0 in | Wt 184.0 lb

## 2013-05-07 DIAGNOSIS — K5730 Diverticulosis of large intestine without perforation or abscess without bleeding: Secondary | ICD-10-CM

## 2013-05-07 DIAGNOSIS — Z800 Family history of malignant neoplasm of digestive organs: Secondary | ICD-10-CM

## 2013-05-07 DIAGNOSIS — Z1211 Encounter for screening for malignant neoplasm of colon: Secondary | ICD-10-CM

## 2013-05-07 LAB — HM COLONOSCOPY

## 2013-05-07 NOTE — Op Note (Signed)
[facility]  [address] [place], [address]   COLONOSCOPY PROCEDURE REPORT  PATIENT: Jerry Palmer, Jerry Palmer  MR#: [id number] BIRTHDATE: October 24, 1945 , 67  yrs. old GENDER: Male ENDOSCOPIST: Sable Feil, MD, [facility] REFERRED BY: PROCEDURE DATE:  05/07/2013 PROCEDURE:   Colonoscopy, surveillance First Screening Colonoscopy - Avg.  risk and is 50 yrs.  old or older - No.      History of Adenoma - Now for follow-up colonoscopy & has been > or = to 3 yrs.  N/A ASA CLASS:   Class II INDICATIONS:Patient's immediate family history of colon cancer. MEDICATIONS: propofol (Diprivan) 150mg  IV  DESCRIPTION OF PROCEDURE:   After the risks benefits and alternatives of the procedure were thoroughly explained, informed consent was obtained.  A digital rectal exam revealed no abnormalities of the rectum.   The LB [id number]  endoscope was introduced through the anus and advanced to the cecum, which was identified by both the appendix and ileocecal valve. No adverse events experienced.   The quality of the prep was excellent, using MoviPrep  The instrument was then slowly withdrawn as the colon was fully examined.      COLON FINDINGS: Mild diverticulosis was noted in the sigmoid colon. The colon was otherwise normal.  There was no diverticulosis, inflammation, polyps or cancers unless previously stated. Retroflexed views revealed no abnormalities. The time to cecum=5 minutes 03 seconds.  Withdrawal time=9 minutes 21 seconds.  The scope was withdrawn and the procedure completed. COMPLICATIONS: There were no complications.  ENDOSCOPIC IMPRESSION: 1.   Mild diverticulosis was noted in the sigmoid colon 2.   The colon was otherwise normal  RECOMMENDATIONS: 1.  Continue current medications 2.  Repeat Colonoscopy in 5 years.   eSigned:  Sable Feil, MD, [facility] 05/07/2013 9:27 AM   cc:   PATIENT NAME:  Jerry Palmer, Jerry Palmer MR#: [id number]

## 2013-05-07 NOTE — Addendum Note (Signed)
Addended by: Gerrit Halls B on: 05/07/2013 12:02 PM   Modules accepted: Orders, SmartSet

## 2013-05-08 ENCOUNTER — Telehealth: Payer: Self-pay | Admitting: *Deleted

## 2013-05-08 NOTE — Telephone Encounter (Signed)
  Follow up Call-  Call back number 05/07/2013  Post procedure Call Back phone  # [phone]  Permission to leave phone message Yes     Patient questions:  Do you have a fever, pain , or abdominal swelling? no Pain Score  0 *  Have you tolerated food without any problems? yes  Have you been able to return to your normal activities? yes  Do you have any questions about your discharge instructions: Diet   no Medications  no Follow up visit  no  Do you have questions or concerns about your Care? no  Actions: * If pain score is 4 or above: No action needed, pain <4.

## 2013-06-13 ENCOUNTER — Telehealth: Payer: Self-pay

## 2013-06-13 NOTE — Telephone Encounter (Signed)
See labs ; please mail. Restart Pravastatin @ 1/2 pill daily. (20 mg).

## 2013-06-13 NOTE — Telephone Encounter (Signed)
Phone call from patient's wife, Rod Holler [phone], stating upon visit on 04/16/13 script was given for cholesterol med but patient was to hold until he heard about results. He has not heard about results to know if he needs to start this medication. Please advise.

## 2013-06-13 NOTE — Telephone Encounter (Signed)
Spoke with the pt's wife(Ruth) and informed her of Dr. Clayborn Heron recommendation below.  She understood and agreed.  Will mail information to the pt.//AB/CMA

## 2013-12-24 ENCOUNTER — Telehealth: Payer: Self-pay | Admitting: Internal Medicine

## 2013-12-24 NOTE — Telephone Encounter (Signed)
Patient spouse advised. Transferred to scheduling for an appt

## 2013-12-24 NOTE — Telephone Encounter (Signed)
Sorry ; we have none

## 2013-12-24 NOTE — Telephone Encounter (Signed)
Patient Information:  Caller Name: Rod Holler  Phone: [phone]  Patient: Rueben, Kassim  Gender: Male  DOB: 17-Mar-1946  Age: 68 Years  PCP: Unice Cobble  Office Follow Up:  Does the office need to follow up with this patient?: Yes  Instructions For The Office: Has go to office now disposition- Appt open at 1645 with Dr. Jenny Reichmann. Wife requesting earlier work in appointment if possible   Symptoms  Reason For Call & Symptoms: Rod Holler states Jayse had onset of lip swelling at 10:30. No difficulty swallowing or difficulty breathing. No tongue swelling or throat tightness. Has had no new foods. No insect bite or sting. Swelling of lip did begin after taking daily med- Jahzion is not sure if he took Amlodipine or Metoprolol XL. Takes each med daily - one in AM and one in PM. States swelling has decreased. HAs had reaction to Lisinopril in the past. Per face swelling protocol has go to office now disposition due to swelling began after taking a medication. Teal is is [place] now. Only appt available is at 16:45 with Dr. Jenny Reichmann. Melquiades would like an earlier appointment if possible. Please call Wife /Ruth at [phone] for possible work in appointment. Devonte is driving- asking should he take an antihistamine?  Reviewed Health History In EMR: Yes  Reviewed Medications In EMR: Yes  Reviewed Allergies In EMR: Yes  Reviewed Surgeries / Procedures: Yes  Date of Onset of Symptoms: 12/24/2013  Guideline(s) Used:  Face Swelling  Disposition Per Guideline:   Go to Office Now  Reason For Disposition Reached:   Swelling began after taking a drug  Advice Given:  N/A  Patient Will Follow Care Advice:  YES

## 2013-12-26 ENCOUNTER — Telehealth: Payer: Self-pay | Admitting: *Deleted

## 2013-12-26 NOTE — Telephone Encounter (Signed)
[facility] Triage Call Report Triage Record Num: [id number] Operator: Agustina Caroli Patient Name: Jerry Palmer Call Date & Time: 12/24/2013 1:22:00PM Patient Phone: PCP: Unice Cobble Patient Gender: Male PCP Fax : [phone] Patient DOB: 08/21/1945 Practice Name: [facility] Reason for Call: Protocol(s) Used: Allergic Reaction, Severe Recommended Outcome per Protocol: Activate EMS 911 Reason for Outcome: History of severe reaction after previous similar exposure to known allergen Care Advice: ~ 09/

## 2014-01-01 ENCOUNTER — Encounter: Payer: Self-pay | Admitting: Gastroenterology

## 2014-01-15 ENCOUNTER — Ambulatory Visit (INDEPENDENT_AMBULATORY_CARE_PROVIDER_SITE_OTHER): Payer: Medicare Other

## 2014-01-15 DIAGNOSIS — Z23 Encounter for immunization: Secondary | ICD-10-CM

## 2014-03-24 ENCOUNTER — Ambulatory Visit: Payer: Self-pay | Admitting: Ophthalmology

## 2014-03-24 DIAGNOSIS — I10 Essential (primary) hypertension: Secondary | ICD-10-CM

## 2014-04-06 ENCOUNTER — Ambulatory Visit: Payer: Self-pay | Admitting: Ophthalmology

## 2014-04-06 HISTORY — PX: CATARACT EXTRACTION: SUR2

## 2014-05-05 ENCOUNTER — Encounter: Payer: Self-pay | Admitting: Internal Medicine

## 2014-05-05 ENCOUNTER — Other Ambulatory Visit: Payer: Self-pay | Admitting: Internal Medicine

## 2014-05-05 ENCOUNTER — Other Ambulatory Visit (INDEPENDENT_AMBULATORY_CARE_PROVIDER_SITE_OTHER): Payer: PPO

## 2014-05-05 ENCOUNTER — Ambulatory Visit (INDEPENDENT_AMBULATORY_CARE_PROVIDER_SITE_OTHER): Payer: PPO | Admitting: Internal Medicine

## 2014-05-05 VITALS — BP 138/88 | HR 76 | Temp 98.0°F | Resp 16 | Ht 68.0 in | Wt 178.2 lb

## 2014-05-05 DIAGNOSIS — I10 Essential (primary) hypertension: Secondary | ICD-10-CM

## 2014-05-05 DIAGNOSIS — R7301 Impaired fasting glucose: Secondary | ICD-10-CM

## 2014-05-05 DIAGNOSIS — Z86010 Personal history of colonic polyps: Secondary | ICD-10-CM

## 2014-05-05 DIAGNOSIS — E782 Mixed hyperlipidemia: Secondary | ICD-10-CM

## 2014-05-05 LAB — HEPATIC FUNCTION PANEL
ALK PHOS: 90 U/L (ref 39–117)
ALT: 25 U/L (ref 0–53)
AST: 21 U/L (ref 0–37)
Albumin: 4.3 g/dL (ref 3.5–5.2)
BILIRUBIN DIRECT: 0.2 mg/dL (ref 0.0–0.3)
TOTAL PROTEIN: 7.2 g/dL (ref 6.0–8.3)
Total Bilirubin: 0.7 mg/dL (ref 0.2–1.2)

## 2014-05-05 LAB — CBC WITH DIFFERENTIAL/PLATELET
BASOS PCT: 0.5 % (ref 0.0–3.0)
Basophils Absolute: 0 10*3/uL (ref 0.0–0.1)
EOS ABS: 0.2 10*3/uL (ref 0.0–0.7)
Eosinophils Relative: 3.7 % (ref 0.0–5.0)
HCT: 46.7 % (ref 39.0–52.0)
Hemoglobin: 16.1 g/dL (ref 13.0–17.0)
Lymphocytes Relative: 25.1 % (ref 12.0–46.0)
Lymphs Abs: 1.4 10*3/uL (ref 0.7–4.0)
MCHC: 34.5 g/dL (ref 30.0–36.0)
MCV: 87.5 fl (ref 78.0–100.0)
MONO ABS: 0.5 10*3/uL (ref 0.1–1.0)
Monocytes Relative: 8.1 % (ref 3.0–12.0)
NEUTROS ABS: 3.5 10*3/uL (ref 1.4–7.7)
NEUTROS PCT: 62.6 % (ref 43.0–77.0)
Platelets: 158 10*3/uL (ref 150.0–400.0)
RBC: 5.34 Mil/uL (ref 4.22–5.81)
RDW: 13 % (ref 11.5–15.5)
WBC: 5.6 10*3/uL (ref 4.0–10.5)

## 2014-05-05 LAB — MICROALBUMIN / CREATININE URINE RATIO
Creatinine,U: 143.2 mg/dL
MICROALB/CREAT RATIO: 0.5 mg/g (ref 0.0–30.0)
Microalb, Ur: <0.7 mg/dL (ref 0.0–1.9)

## 2014-05-05 LAB — BASIC METABOLIC PANEL
BUN: 19 mg/dL (ref 6–23)
CALCIUM: 9.5 mg/dL (ref 8.4–10.5)
CO2: 28 meq/L (ref 19–32)
Chloride: 105 mEq/L (ref 96–112)
Creatinine, Ser: 1.04 mg/dL (ref 0.40–1.50)
GFR: 75.01 mL/min (ref >60.00)
Glucose, Bld: 122 mg/dL — ABNORMAL HIGH (ref 70–99)
POTASSIUM: 4.5 meq/L (ref 3.5–5.1)
SODIUM: 140 meq/L (ref 135–145)

## 2014-05-05 LAB — HEMOGLOBIN A1C: HEMOGLOBIN A1C: 6.7 % — AB (ref 4.6–6.5)

## 2014-05-05 LAB — TSH: TSH: 3.15 u[IU]/mL (ref 0.35–4.50)

## 2014-05-05 NOTE — Assessment & Plan Note (Addendum)
NMR Lipoprofile, LFTs, TSH ,CK

## 2014-05-05 NOTE — Assessment & Plan Note (Signed)
Blood pressure goals reviewed. BMET 

## 2014-05-05 NOTE — Assessment & Plan Note (Signed)
A1c , urine microalbumin, BMET 

## 2014-05-05 NOTE — Patient Instructions (Signed)
  Your next office appointment will be determined based upon review of your pending labs . Those instructions will be transmitted to you  by mail . Critical values will be called. Minimal Blood Pressure Goal= AVERAGE < 140/90;  Ideal is an AVERAGE < 135/85. This AVERAGE should be calculated from @ least 5-7 BP readings taken @ different times of day on different days of week. You should not respond to isolated BP readings , but rather the AVERAGE for that week .Please bring your  blood pressure cuff to office visits to verify that it is reliable.It  can also be checked against the blood pressure device at the pharmacy. Finger or wrist cuffs are not dependable; an arm cuff is.

## 2014-05-05 NOTE — Progress Notes (Signed)
Subjective:    Patient ID: Jerry Palmer, male    DOB: 02-27-1946, 69 y.o.   MRN: [id number]  HPI The patient is here to assess status of active health conditions.  PMH, FH, & Social History reviewed & updated.  He is on a modified heart healthy diet. He exercises and is very physically active without cardiopulmonary symptoms.  He is not monitoring his blood pressure on a regular basis.  Colonoscopy was completed in February 2015. He would be due in 5 years because of a strong family history of colon cancer among his 3 brothers.  Other than constipation for which he takes Metamucil; he has no GI symptoms.    Review of Systems  Significant headaches, epistaxis, chest pain, palpitations, exertional dyspnea, claudication, paroxysmal nocturnal dyspnea, or edema absent. No significant GI symptoms , memory loss or myalgias  Unexplained weight loss, abdominal pain, significant dyspepsia, dysphagia, melena, rectal bleeding, or persistently small caliber stools are denied.      Objective:   Physical Exam  Gen.: Adequately nourished in appearance. Alert, appropriate and cooperative throughout exam. Appears younger than stated age  Head: Normocephalic without obvious abnormalities;pattern alopecia  Eyes: No corneal or conjunctival inflammation noted. Pupils equal round reactive to light and accommodation. Extraocular motion intact.  Ears: External  ear exam reveals no significant lesions or deformities. Wax bilaterally yet hearing is grossly normal bilaterally. Nose: External nasal exam reveals no deformity or inflammation. Nasal mucosa are pink and moist. No lesions or exudates noted.   Mouth: Oral mucosa and oropharynx reveal no lesions or exudates. Teeth in good repair. Neck: No deformities, masses, or tenderness noted. Range of motion &  Thyroid normal Lungs: Normal respiratory effort; chest expands symmetrically. Lungs are clear to auscultation without rales, wheezes, or increased  work of breathing. Heart: Normal rate and rhythm. Normal S1 and S2. No gallop, click, or rub. No murmur. Abdomen: Bowel sounds normal; abdomen soft and nontender. No masses, organomegaly or hernias noted. Genitalia: Genitalia normal except for minor left varices. Prostate is normal without enlargement, asymmetry, nodularity, or induration                                 Musculoskeletal/extremities: No deformity or scoliosis noted of  the thoracic or lumbar spine.  No clubbing, cyanosis, edema, or significant extremity  deformity noted.  Range of motion normal . Tone & strength normal. Hand joints normal Fingernail  health good. Able to lie down & sit up w/o help.  Negative SLR bilaterally Vascular: Carotid, radial artery, dorsalis pedis and  posterior tibial pulses are full and equal. No bruits present. Neurologic: Alert and oriented x3. Deep tendon reflexes symmetrical and normal.  Gait normal     Skin: Intact without suspicious lesions or rashes. Lymph: No cervical, axillary, or inguinal lymphadenopathy present. Psych: Mood and affect are normal. Normally interactive                                                                                      Assessment & Plan:  See Current Assessment & Plan in Problem List under  specific Diagnosis

## 2014-05-05 NOTE — Assessment & Plan Note (Signed)
CBC

## 2014-05-05 NOTE — Progress Notes (Signed)
Pre visit review using our clinic review tool, if applicable. No additional management support is needed unless otherwise documented below in the visit note. 

## 2014-05-06 ENCOUNTER — Other Ambulatory Visit: Payer: Self-pay | Admitting: Internal Medicine

## 2014-05-06 DIAGNOSIS — E119 Type 2 diabetes mellitus without complications: Secondary | ICD-10-CM

## 2014-05-07 LAB — NMR LIPOPROFILE WITH LIPIDS
Cholesterol, Total: 168 mg/dL (ref 100–199)
HDL PARTICLE NUMBER: 36.8 umol/L (ref >=30.5)
HDL SIZE: 8.3 nm — AB (ref >=9.2)
HDL-C: 48 mg/dL (ref >39)
LDL (calc): 102 mg/dL — ABNORMAL HIGH (ref 0–99)
LDL Particle Number: 1441 nmol/L — ABNORMAL HIGH (ref <1000)
LDL Size: 20 nm (ref >=20.8)
LP-IR Score: 53 — ABNORMAL HIGH (ref <=45)
Large HDL-P: <1.3 umol/L — ABNORMAL LOW (ref >=4.8)
Large VLDL-P: 1 nmol/L (ref <=2.7)
Small LDL Particle Number: 905 nmol/L — ABNORMAL HIGH (ref <=527)
TRIGLYCERIDES: 89 mg/dL (ref 0–149)
VLDL Size: 44.7 nm (ref <=46.6)

## 2014-05-11 ENCOUNTER — Telehealth: Payer: Self-pay | Admitting: Internal Medicine

## 2014-05-11 NOTE — Telephone Encounter (Signed)
emmi mailed  °

## 2014-07-02 ENCOUNTER — Other Ambulatory Visit: Payer: Self-pay

## 2014-07-02 DIAGNOSIS — E785 Hyperlipidemia, unspecified: Secondary | ICD-10-CM

## 2014-07-02 MED ORDER — AMLODIPINE BESYLATE 5 MG PO TABS: ORAL_TABLET | ORAL | Status: DC

## 2014-07-02 MED ORDER — PRAVASTATIN SODIUM 40 MG PO TABS
ORAL_TABLET | ORAL | Status: DC
Start: 2014-07-02 — End: 2015-10-06

## 2014-07-02 MED ORDER — METOPROLOL SUCCINATE ER 25 MG PO TB24: ORAL_TABLET | ORAL | Status: DC

## 2014-07-26 NOTE — Op Note (Signed)
PATIENT NAME:  Jerry Palmer, Jerry Palmer MR#:  [id number] DATE OF BIRTH:  21-Mar-1946  DATE OF PROCEDURE:  04/06/2014  PREOPERATIVE DIAGNOSIS:  Nuclear sclerotic cataract right eye.    POSTOPERATIVE DIAGNOSIS:  Nuclear sclerotic cataract right eye.    PROCEDURE PERFORMED: Extracapsular cataract extraction using phacoemulsification with placement of Alcon [id number] 22.5 diopter posterior chamber lens, 3.75 diopter cylinder correction, serial number [id number].    ANESTHESIA:  4% lidocaine and 0.75% Marcaine, a 50-50 mixture with 10 units per mL of Hylenex added given as a peribulbar.   ANESTHESIOLOGIST: Dr. Andree Elk.   COMPLICATIONS: None.   ESTIMATED BLOOD LOSS: Less than 1 mL.    DESCRIPTION OF PROCEDURE:  The patient was brought to the operating room and each eye was anesthetized with topical proparacaine. With the patient sitting upright and fixing at a distant target the 3 and 9 o'clock positions were marked using marking pen. The patient was placed supine, given IV sedation and peribulbar block. He was then prepped and draped in the usual fashion. The Rheems system was turned on and registered and the 0 degree axis was marked using a marking pen. A partial-thickness scleral groove was made at the 90 degree position after hemostasis had been obtained with cautery. The anterior chamber was entered superonasally through clear cornea with a paracentesis knife and through the lamellar dissection with a 2.6 mm keratome. DisCoVisc was used to replace the aqueous and a continuous tear capsulorrhexis was carried out. Hydrodissection was used to loosen the nucleus and phacoemulsification was carried out in divide and conquer technique. Ultrasound time was 1 minute and 13 seconds with an average power of 25.2%, CDE of 31.83. Irrigation-aspiration was used to remove the residual cortex. The capsular bag was inflated with DisCoVisc and the intraocular lens was inserted in the capsular bag. The Verion was turned back on  and the lens was lined with 0 degree marks indicated by the [place]. Irrigation-aspiration was used to remove the residual DisCoVisc and the wound was inflated with balanced salt. Miostat was injected through the paracentesis track. The position of the lens was again verified by the [place]. 0.1 mL of cefuroxime containing 1 mg of drug was injected through the paracentesis track. The wound was checked for leaks. None were found. The bridle sutures were removed and 2 drops of Vigamox were placed on the eye. A shield was placed on the eye and the patient was discharged to the recovery area in good condition.      ____________________________ Loura Back Aralyn Nowak, MD sad:bu D: 04/06/2014 13:20:29 ET T: 04/06/2014 13:46:30 ET JOB#: [id number]  cc: Remo Lipps A. Xzaiver Vayda, MD, <Dictator> Martie Lee MD ELECTRONICALLY SIGNED 04/08/2014 16:21

## 2014-11-12 ENCOUNTER — Telehealth: Payer: Self-pay

## 2014-11-12 NOTE — Telephone Encounter (Signed)
Patient called to educate on Medicare Wellness apt. LVM for the patient to call back to educate and schedule for wellness visit.   

## 2014-11-19 NOTE — Telephone Encounter (Signed)
Incoming message from Jerry Palmer and stated she was working but would be off at 4pm and would call back today regarding outreach for AWV; Preventive exam completed this year.

## 2014-11-25 NOTE — Telephone Encounter (Signed)
Call back to schedule AWV for both patient and spouse/ Scheduled for 9/7 at [place] following spouse at [place]; but will do exam together

## 2014-12-02 ENCOUNTER — Telehealth: Payer: Self-pay

## 2014-12-02 ENCOUNTER — Ambulatory Visit (INDEPENDENT_AMBULATORY_CARE_PROVIDER_SITE_OTHER): Payer: PPO

## 2014-12-02 VITALS — BP 136/80 | Ht 68.5 in | Wt 180.5 lb

## 2014-12-02 DIAGNOSIS — Z23 Encounter for immunization: Secondary | ICD-10-CM

## 2014-12-02 DIAGNOSIS — Z0000 Encounter for general adult medical examination without abnormal findings: Secondary | ICD-10-CM

## 2014-12-02 NOTE — Progress Notes (Signed)
Medical screening examination/treatment/procedure(s) were performed by non-physician practitioner and as supervising physician I was immediately available for consultation/collaboration. I agree with above. Joanthan Hlavacek, MD   

## 2014-12-02 NOTE — Telephone Encounter (Signed)
Call to [facility] in [place]; Phone: [phone] Confirmed by pharmacy the patient had his shingles vaccine on 03/17/2014 Will update Medical record

## 2014-12-02 NOTE — Patient Instructions (Addendum)
Jerry Palmer , Thank you for taking time to come for your Medicare Wellness Visit. I appreciate your ongoing commitment to your health goals. Please review the following plan we discussed and let me know if I can assist you in the future.   Has eye exam coming up Will take flu shot and prevnar today   These are the goals we discussed: Goals    . Weight < 170 lb (77.111 kg)     Will stop eating ice cream daily; will monitor portions  Controllable  risk for heart disease reviewed for goal setting:  Excess body fat around the waist Waist circumference women >35 Waist circumference for Men >40  Triglycerides > 150- good HDL < 50- good BP > 130/85 - good Glucose > 100 (is trending up)  Goals are determined by the physician and based on other relevant data and risk Plan Lifestyle changes  BMI reviewed   Risk Calculator: [email or web address]  BP Education: <120/80; any elevation will warrant further checks   Barriers to successful management: None noted  Stress; (1-5) Risk and reduction techniques reviewed   Google pre-diabetes  Fat free or low fat dairy products Fish high in omega-3 acids ( salmon, tuna, trout) Fruits, such as apples, bananas, oranges, pears, prunes Legumes, such as kidney beans, lentils, checkpeas, black-eyed peas and lima beans Vegetables; broccoli, cabbage, carrots Whole grains;   Plant fats are better; decrease "white" foods as pasta, rice, bread and desserts, sugar; Avoid red meat (limiting) palm and coconut oils; sugary foods and beverages  Two nutrients that raise blood chol levels are saturated fats and trans fat; in hydrogenated oils and fats, as stick margarine, baked goods (cookes, cakes, pies, crackers; frosting; and coffee creamers;   Some Fats lower cholesterol: Monounsaturated and polyunsaturated  Avocados Corn, sunflower, and soybean oils Nuts and seeds, such as walnuts Olive, canola, peanut, safflower, and sesame oils Peanut butter  Salmon and trout Tofu         This is a list of the screening recommended for you and due dates:  Health Maintenance  Topic Date Due  .  Hepatitis C: One time screening is recommended by Center for Disease Control  (CDC) for  adults born from [birth year] through 1967.   July 05, 1945  . Complete foot exam   03/14/1954  . Eye exam for diabetics  03/14/1954  . Shingles Vaccine  03/14/2004  . Pneumonia vaccines (2 of 2 - PCV13) 01/08/2011  . Flu Shot  10/26/2014  . Hemoglobin A1C  11/03/2014  . Urine Protein Check  05/06/2015  . Colon Cancer Screening  05/07/2018  . Tetanus Vaccine  11/20/2018     Screening for Type 2 Diabetes Screening is a way to check for type 2 diabetes in people who do not have symptoms of the disease, but who may likely develop diabetes in the future. Diabetes can lead to serious health problems, but finding diabetes early allows for early treatment. DIABETES RISK FACTORS   Family history of diabetes.  Diseases of the pancreas.  Obesity or being overweight.  Certain racial or ethnic groups:  American [place].  Pacific Islander.  Hispanic.  Asian.  African American.  High blood pressure (hypertension).  History of diabetes while pregnant (gestational diabetes).  Delivering a baby that weighed over 9 pounds.  Being inactive.  High cholesterol or triglycerides.  Age, especially over 36 years of age. WHO IS SCREENED Adults  Adults who have no risk factors and no symptoms should be screened starting at age  45. If the screening tests are normal, they should be repeated every 3 years.  Adults who do not have symptoms, but are overweight, should be screened before age 4.  Adults who do not have symptoms, but have 1 or more risk factors, should be screened.  Adults who have an A1c (3 month average of blood glucose) greater than 5.7% or who had an impaired glucose tolerance (IGT) or impaired fasting glucose (IFG) on a previous test should be  screened.  Pregnant women with or without risk factors should be screened.  Women who gave birth and had gestational diabetes should be screened. This testing should be done 6 to 12 weeks after the child is born. Children or Adolescents  Children and adolescents should be screened for type 2 diabetes if they are overweight and have 2 of the following risk factors:  Having a family history of type 2 diabetes.  Being a member of a high risk race or ethnic group.  Having signs of insulin resistance or conditions associated with insulin resistance.  Having a mother who had gestational diabetes while pregnant with him or her.  Screening should start at age 96 or at the onset of puberty, whichever comes first. This should be repeated every 2 years. SCREENING In a screening, your caregiver may:  Ask questions about your overall health. This will include questions about the health of close family members, too.  Ask about any diabetes-like symptoms you may have.  Perform a physical exam.  Order some tests that may include:  A fasting plasma glucose test. This measures the level of glucose in your blood. It is done after you have had nothing to eat but water (fasted) for 8 hours.  A random blood glucose test. This test is done without the need to fast.  An oral glucose tolerance test. This is a blood test done in 2 parts. First, a blood sample is taken after you have fasted. Then, another sample is taken after you drink a liquid that contains a lot of sugar.  An A1c test. This test shows how much glucose has been in your blood over the past 2 to 3 months. Document Released: 01/07/2009 Document Revised: 06/05/2011 Document Reviewed: 10/19/2010 [facility] Patient Information 2015 [place], [place]. This information is not intended to replace advice given to you by your health care provider. Make sure you discuss any questions you have with your health care provider.  Glaucoma Glaucoma  happens when the fluid pressure in the eyeball is too high. The pressure cannot stay high for too long, or the eyeball may become damaged. Signs of glaucoma include:  Having a hard time seeing in a dark room after being in a bright one.  Having trouble seeing things out to the sides of you.  Blurry sight.  Seeing bright white lights or colors in front of your eyes.  Headaches.  Feeling sick to your stomach (nauseous) or throwing up (vomiting).  Sudden vision loss. Glaucoma testing is an important part of taking care of your eyesight. HOME CARE  Always use your eyedrops or pills as told by your doctor. Do not run out.  Do not go away from home without your eyedrops or pills.  Keep your appointments.  Always tell a new doctor that you have glaucoma and how long you have had it. Tell the doctor about the eyedrops and pills you take.  Do not use eyedrops or pills that have not been prescribed by your doctor. GET HELP RIGHT AWAY  IF:  You develop severe pain in the affected eye.  You develop vision problems.  You develop a bad headache in the area around the eye.  You feel sick to your stomach or throw up.  You start to have problems with the other eye. MAKE SURE YOU:   Understand these instructions.  Will watch your condition.  Will get help right away if you are not doing well or get worse. Document Released: 12/21/2007 Document Revised: 06/05/2011 Document Reviewed: 12/21/2007 [facility] Patient Information 2015 [place], [place]. This information is not intended to replace advice given to you by your health care provider. Make sure you discuss any questions you have with your health care provider.  Health Maintenance A healthy lifestyle and preventative care can promote health and wellness.  Maintain regular health, dental, and eye exams.  Eat a healthy diet. Foods like vegetables, fruits, whole grains, low-fat dairy products, and lean protein foods contain the  nutrients you need and are low in calories. Decrease your intake of foods high in solid fats, added sugars, and salt. Get information about a proper diet from your health care provider, if necessary.  Regular physical exercise is one of the most important things you can do for your health. Most adults should get at least 150 minutes of moderate-intensity exercise (any activity that increases your heart rate and causes you to sweat) each week. In addition, most adults need muscle-strengthening exercises on 2 or more days a week.   Maintain a healthy weight. The body mass index (BMI) is a screening tool to identify possible weight problems. It provides an estimate of body fat based on height and weight. Your health care provider can find your BMI and can help you achieve or maintain a healthy weight. For males 20 years and older:  A BMI below 18.5 is considered underweight.  A BMI of 18.5 to 24.9 is normal.  A BMI of 25 to 29.9 is considered overweight.  A BMI of 30 and above is considered obese.  Maintain normal blood lipids and cholesterol by exercising and minimizing your intake of saturated fat. Eat a balanced diet with plenty of fruits and vegetables. Blood tests for lipids and cholesterol should begin at age 67 and be repeated every 5 years. If your lipid or cholesterol levels are high, you are over age 15, or you are at high risk for heart disease, you may need your cholesterol levels checked more frequently.Ongoing high lipid and cholesterol levels should be treated with medicines if diet and exercise are not working.  If you smoke, find out from your health care provider how to quit. If you do not use tobacco, do not start.  Lung cancer screening is recommended for adults aged 40-80 years who are at high risk for developing lung cancer because of a history of smoking. A yearly low-dose CT scan of the lungs is recommended for people who have at least a 30-pack-year history of smoking and  are current smokers or have quit within the past 15 years. A pack year of smoking is smoking an average of 1 pack of cigarettes a day for 1 year (for example, a 30-pack-year history of smoking could mean smoking 1 pack a day for 30 years or 2 packs a day for 15 years). Yearly screening should continue until the smoker has stopped smoking for at least 15 years. Yearly screening should be stopped for people who develop a health problem that would prevent them from having lung cancer treatment.  If you choose to drink alcohol, do not have more than 2 drinks per day. One drink is considered to be 12 oz (360 mL) of beer, 5 oz (150 mL) of wine, or 1.5 oz (45 mL) of liquor.  Avoid the use of street drugs. Do not share needles with anyone. Ask for help if you need support or instructions about stopping the use of drugs.  High blood pressure causes heart disease and increases the risk of stroke. Blood pressure should be checked at least every 1-2 years. Ongoing high blood pressure should be treated with medicines if weight loss and exercise are not effective.  If you are 25-62 years old, ask your health care provider if you should take aspirin to prevent heart disease.  Diabetes screening involves taking a blood sample to check your fasting blood sugar level. This should be done once every 3 years after age 70 if you are at a normal weight and without risk factors for diabetes. Testing should be considered at a younger age or be carried out more frequently if you are overweight and have at least 1 risk factor for diabetes.  Colorectal cancer can be detected and often prevented. Most routine colorectal cancer screening begins at the age of 33 and continues through age 71. However, your health care provider may recommend screening at an earlier age if you have risk factors for colon cancer. On a yearly basis, your health care provider may provide home test kits to check for hidden blood in the stool. A small camera  at the end of a tube may be used to directly examine the colon (sigmoidoscopy or colonoscopy) to detect the earliest forms of colorectal cancer. Talk to your health care provider about this at age 10 when routine screening begins. A direct exam of the colon should be repeated every 5-10 years through age 51, unless early forms of precancerous polyps or small growths are found.  People who are at an increased risk for hepatitis B should be screened for this virus. You are considered at high risk for hepatitis B if:  You were born in a country where hepatitis B occurs often. Talk with your health care provider about which countries are considered high risk.  Your parents were born in a high-risk country and you have not received a shot to protect against hepatitis B (hepatitis B vaccine).  You have HIV or AIDS.  You use needles to inject street drugs.  You live with, or have sex with, someone who has hepatitis B.  You are a man who has sex with other men (MSM).  You get hemodialysis treatment.  You take certain medicines for conditions like cancer, organ transplantation, and autoimmune conditions.  Hepatitis C blood testing is recommended for all people born from 40 through 1965 and any individual with known risk factors for hepatitis C.  Healthy men should no longer receive prostate-specific antigen (PSA) blood tests as part of routine cancer screening. Talk to your health care provider about prostate cancer screening.  Testicular cancer screening is not recommended for adolescents or adult males who have no symptoms. Screening includes self-exam, a health care provider exam, and other screening tests. Consult with your health care provider about any symptoms you have or any concerns you have about testicular cancer.  Practice safe sex. Use condoms and avoid high-risk sexual practices to reduce the spread of sexually transmitted infections (STIs).  You should be screened for STIs,  including gonorrhea and chlamydia if:  You  are sexually active and are younger than 24 years.  You are older than 24 years, and your health care provider tells you that you are at risk for this type of infection.  Your sexual activity has changed since you were last screened, and you are at an increased risk for chlamydia or gonorrhea. Ask your health care provider if you are at risk.  If you are at risk of being infected with HIV, it is recommended that you take a prescription medicine daily to prevent HIV infection. This is called pre-exposure prophylaxis (PrEP). You are considered at risk if:  You are a man who has sex with other men (MSM).  You are a heterosexual man who is sexually active with multiple partners.  You take drugs by injection.  You are sexually active with a partner who has HIV.  Talk with your health care provider about whether you are at high risk of being infected with HIV. If you choose to begin PrEP, you should first be tested for HIV. You should then be tested every 3 months for as long as you are taking PrEP.  Use sunscreen. Apply sunscreen liberally and repeatedly throughout the day. You should seek shade when your shadow is shorter than you. Protect yourself by wearing long sleeves, pants, a wide-brimmed hat, and sunglasses year round whenever you are outdoors.  Tell your health care provider of new moles or changes in moles, especially if there is a change in shape or color. Also, tell your health care provider if a mole is larger than the size of a pencil eraser.  A one-time screening for abdominal aortic aneurysm (AAA) and surgical repair of large AAAs by ultrasound is recommended for men aged 51-75 years who are current or former smokers.  Stay current with your vaccines (immunizations). Document Released: 09/09/2007 Document Revised: 03/18/2013 Document Reviewed: 08/08/2010 [facility] Patient Information 2015 [place], [place]. This information is not  intended to replace advice given to you by your health care provider. Make sure you discuss any questions you have with your health care provider.  Insulin Resistance Blood sugar (glucose) levels are controlled by a hormone called insulin. Insulin is made by your pancreas. When your blood glucose goes up, insulin is released into your blood. Insulin is required for your body to function normally. However, your body can become resistant to your own insulin or to insulin given to treat diabetes. In either case, insulin resistance can lead to serious problems. These problems include:  Type 2 diabetes.  Heart disease.  High blood pressure.  Stroke.  Polycystic ovary syndrome.  Fatty liver. CAUSES  Insulin resistance can develop for many different reasons. It is more likely to happen in people with these conditions or characteristics:  Obesity.  Inactivity.  Pregnancy.  High blood pressure.  Stress.  Steroid use.  Infection or severe illness.  Increased levels of cholesterol and triglycerides. SYMPTOMS  There are no symptoms. You may have symptoms related to the various complications of insulin resistance.  DIAGNOSIS  Several different things can make your caregiver suspect you have insulin resistance. These include:  High blood glucose (hyperglycemia).  Abnormal cholesterol levels.  High uric acid levels.  Changes related to blood pressure.  Changes related to inflammation. Insulin resistance can be determined with blood tests. An elevated insulin level when you have not eaten might suggest resistance. Other more complicated tests are sometimes necessary. TREATMENT  Lifestyle changes are the most important treatment for insulin resistance.   If you are  overweight and you have insulin resistance, you can improve your insulin sensitivity by losing weight.  Moderate exercise for 30-40 minutes, 4 days a week, can improve insulin sensitivity. Some medicines can also help  improve your insulin sensitivity. Your caregiver can discuss these with you if they are appropriate.  HOME CARE INSTRUCTIONS   Do not smoke.  Keep your weight at a healthy level.  Get exercise.  If you have diabetes, follow your caregiver's directions.  If you have high blood pressure, follow your caregiver's directions.  Only take prescription medicines for pain, fever, or discomfort as directed by your caregiver. SEEK MEDICAL CARE IF:   You are diabetic and you are having problems keeping your blood glucose levels at target range.  You are having episodes of low blood glucose (hypoglycemia).  You feel you might be having side effects from your medicines.  You have symptoms of an illness that is not improving after 3-4 days.  You have a sore or wound that is not healing.  You notice a change in vision or a new problem with your vision. SEEK IMMEDIATE MEDICAL CARE IF:   Your blood glucose goes below 70, especially if you have confusion, lightheadedness, or other symptoms with it.  Your blood glucose is very high (as advised by your caregiver) twice in a row.  You pass out.  You have chest pain or trouble breathing.  You have a sudden, severe headache.  You have sudden weakness in one arm or one leg.  You have sudden difficulty speaking or swallowing.  You develop vomiting or diarrhea that is getting worse or not improving after 1 day. Document Released: 05/02/2005 Document Revised: 09/12/2011 Document Reviewed: 08/22/2012 [facility] Patient Information 2015 [place], [place]. This information is not intended to replace advice given to you by your health care provider. Make sure you discuss any questions you have with your health care provider.

## 2014-12-02 NOTE — Progress Notes (Signed)
Subjective:   Jerry Palmer is a 69 y.o. male who presents for Medicare Annual/Subsequent preventive examination.  Review of Systems:  HRA assessment completed during visit; Jerry Palmer is here for Annual Wellness Assessment The Palmer was informed that this wellness visit is to identify risk and educate on how to reduce risk for increase disease through lifestyle changes.   ROS deferred to CPE exam with physician  Medical conditions being managed medically; HTN; DM2; hyperlipidemia;  Results for BRYLER, DIBBLE (MRN [id number]) as of 12/01/2014 18:06  Ref. 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   Urinary or fecal incontinence reviewed   Counseling: Hepatitis c; no risk identified Foot exam deferred to physical Colonoscopy; 04/2013/ repeat in 5 years 2020/ Dr. Sharlett Iles; family hx of cancer; Brother has cancer now; 2 brothers with colon cancer; EKG 03/24/2014 Hearing: 2000 hz right; 4000 hz in left Ophthalmology exam; To be scheduled; cataract off right eye in  January  Immunizations Due  Zoster/ had at [facility] in [place], not sure if this year or last  Prevnar 13 given today Flu vaccine- High does given today        Objective:    Vitals: BP 136/80 mmHg  Ht 5' 8.5" (1.74 m)  Wt 180 lb 8 oz (81.874 kg)  BMI 27.04 kg/m2  Tobacco History  Smoking status  . Former Smoker  . Types: Cigarettes  . Quit date: 03/27/1978  Smokeless tobacco  . Never Used    Comment: smoked [place], up to 1.5 ppd      Counseling given: Not Answered   Past Medical History  Diagnosis Date  . Hypertension   . Hematuria     PMH of  . Diverticulosis of colon   . Cancer     Basal Cell  . Hyperlipidemia   . Fasting hyperglycemia    Past Surgical History  Procedure Laterality Date  . Cataract left eye  1995  . Colonoscopy w/ polypectomy       polyp x 1; negative X 2;Dr Sharlett Iles  . Cyst scalp  2005  . Cataract extraction  04/06/14    right eye   Family History  Problem Relation Age of Onset  . Breast cancer Mother   . Hypertension Mother   . Diabetes Sister   . Cancer Brother     ?spinal mets  . Colon cancer Brother 38  . Stroke Maternal Grandfather      > 55  . Heart disease Neg Hx   . Colon cancer Brother 22  . Colon cancer Brother 21  History  Sexual Activity  . Sexual Activity: Not on file    Outpatient Encounter Prescriptions as of 12/02/2014  Medication Sig  . amLODipine (NORVASC) 5 MG tablet TAKE ONE TABLET BY MOUTH EVERY DAY.  Marland Kitchen aspirin 81 MG tablet Take 81 mg by mouth daily.    . metoprolol succinate (TOPROL-XL) 25 MG 24 hr tablet TAKE ONE TABLET BY MOUTH EVERY DAY.  . Multiple Vitamin (MULTIVITAMIN) tablet Take 1 tablet by mouth daily.    . pravastatin (PRAVACHOL) 40 MG tablet TAKE ONE TABLET BY MOUTH EVERY DAY AT BEDTIME   No facility-administered encounter medications on file as of 12/02/2014.    Activities of Daily Living In your present state of health, do you have any difficulty performing the following activities:  12/02/2014 05/05/2014  Hearing? N N  Vision? N N  Difficulty concentrating or making decisions? N N  Walking or climbing stairs? N N  Dressing or bathing? N N  Doing errands, shopping? N N  Preparing Food and eating ? N -  Using the Toilet? N -  In the past six months, have you accidently leaked urine? N -  Do you have problems with loss of bowel control? N -  Managing your Medications? N -  Managing your Finances? N -  Housekeeping or managing your Housekeeping? N -    Palmer Care Team: Hendricks Limes, MD as PCP - General   Assessment:   Assessment   Today Palmer counseled on age appropriate routine health concerns for screening and prevention, each reviewed and up to date or declined. Immunizations reviewed and are up to date.  Labs deferred for CPE or medical fup by MD.  Risk factors for depression reviewed and negative. Hearing function and visual acuity are intact. ADLs screened and addressed as needed. Functional ability and level of safety reviewed and appropriate. Educated on memory loss and AD8 or MMSE completed; Education, counseling and referrals performed based on assessed risks today. Palmer provided with a copy of personalized plan for preventive services and due dates  FALL PREVENTION Continues to work in [employer];  Very active  Comfort reviewed; no risk identified; Declined screen  TOBACCO/ETOH or other DRUG use was negative  RISK FOR CVD or DM reviewed due to a1c trending up  Excess body fat around the waist Waist circumference women >35 Waist circumference for Men >40  Triglycerides > 150 ( is good) HDL < 50 ( is good)  BP > 130/85 ( is good today)  Glucose > 100; trending up Goals are to lose a few pounds; to monitor sugar and conserve eating ice cream;   Education given on Insulin Resistance to be controlled by diet and exercise; Educated on diet and calories matching energy consumption; as well as the type of calories Now, eats on the go as  sandwiches and peanut butter  Palmer will review and call for questions     Exercise Activities and Dietary recommendations Current Exercise Habits:: Home exercise routine, Time (Minutes): 60, Frequency (Times/Week): 5, Weekly Exercise (Minutes/Week): 300, Intensity: Moderate  Goals    . Weight < 170 lb (77.111 kg)     Will stop eating ice cream daily; will monitor portions  Controllable  risk for heart disease reviewed for goal setting:  Excess body fat around the waist Waist circumference women >35 Waist circumference for Men >40  Triglycerides > 150- good HDL < 50- good BP > 130/85 - good Glucose > 100 (is trending up)  Goals are determined  by the physician and based on other relevant data and risk Plan Lifestyle changes  BMI reviewed   Risk Calculator: [email or web address]  BP Education: <120/80; any elevation will warrant further checks   Barriers to successful management: None noted  Stress; (1-5) Risk and reduction techniques reviewed   Google pre-diabetes  Fat free or low fat dairy products Fish high in omega-3 acids ( salmon, tuna, trout) Fruits, such as apples, bananas, oranges, pears, prunes Legumes, such as kidney beans, lentils, checkpeas, black-eyed peas and lima beans Vegetables; broccoli, cabbage, carrots Whole grains;   Plant fats are better; decrease "white" foods as pasta, rice, bread and desserts, sugar; Avoid red meat (limiting) palm and coconut oils; sugary foods and beverages  Two nutrients that raise blood chol levels are saturated fats and trans fat; in hydrogenated oils and fats, as stick margarine, baked goods (cookes, cakes, pies, crackers; frosting; and coffee creamers;   Some Fats lower cholesterol: Monounsaturated and polyunsaturated  Avocados Corn, sunflower, and soybean oils Nuts and seeds, such as walnuts Olive, canola, peanut, safflower, and sesame oils Peanut butter Salmon and trout Tofu        Fall Risk Fall Risk   12/02/2014 05/05/2014 04/16/2013 03/12/2012  Falls in the past year? No No No No   Depression Screen PHQ 2/9 Scores 12/02/2014 05/05/2014 04/16/2013 03/12/2012  PHQ - 2 Score 0 0 0 0    Cognitive Testing MMSE - Mini Mental State Exam 12/02/2014  Not completed: (No Data)    Immunization History  Administered Date(s) Administered  . Influenza Split 03/09/2011  . Influenza, High Dose Seasonal PF 01/15/2013, 12/02/2014  . Influenza, Seasonal, Injecte, Preservative Fre 03/12/2012  . Influenza,inj,Quad PF,36+ Mos 01/15/2014  . Pneumococcal Conjugate-13 12/02/2014  . Pneumococcal Polysaccharide-23 01/07/2010  . Td 11/19/2008   Screening Tests Health Maintenance  Topic Date Due  . Hepatitis C Screening  1945/07/10  . FOOT EXAM  03/14/1954  . OPHTHALMOLOGY EXAM  03/14/1954  . ZOSTAVAX  03/14/2004  . INFLUENZA VACCINE  10/26/2014  . HEMOGLOBIN A1C  11/03/2014  . URINE MICROALBUMIN  05/06/2015  . COLONOSCOPY  05/07/2018  . TETANUS/TDAP  11/20/2018  . PNA vac Low Risk Adult  Completed      Plan:     Took prevnar 13 and High does flu shot today; Will fup on date of shinges vaccine; The Palmer will monitor sugar intake and continue to exercise;   During the course of the visit the Palmer was educated and counseled about the following appropriate screening and preventive services:   Vaccines to include Pneumoccal, Influenza, Hepatitis B, Td, Zostavax, HCV/   Electrocardiogram; 11/2011  Cardiovascular Disease/ no issues  Colorectal cancer screening/ not due but q 5 years due to strong family hx of colon cancer  Diabetes screening / Educated on pre diabetes  Prostate Cancer Screening/ deferred   Glaucoma screening/ had cataract surgery this year; eye exam is scheduled   Nutrition counseling / started   Smoking cessation counseling n/a  Palmer Instructions (the written plan) was given to the Palmer.    Wynetta Fines, RN  12/02/2014

## 2015-02-03 ENCOUNTER — Ambulatory Visit (INDEPENDENT_AMBULATORY_CARE_PROVIDER_SITE_OTHER): Payer: PPO | Admitting: Family

## 2015-02-03 ENCOUNTER — Encounter: Payer: Self-pay | Admitting: Family

## 2015-02-03 ENCOUNTER — Other Ambulatory Visit: Payer: PPO

## 2015-02-03 VITALS — BP 138/78 | HR 96 | Temp 99.0°F | Resp 18 | Ht 68.0 in | Wt 169.0 lb

## 2015-02-03 DIAGNOSIS — R300 Dysuria: Secondary | ICD-10-CM | POA: Diagnosis not present

## 2015-02-03 LAB — POCT URINALYSIS DIPSTICK
Glucose, UA: NEGATIVE
KETONES UA: NEGATIVE
Nitrite, UA: POSITIVE
Spec Grav, UA: >=1.03
Urobilinogen, UA: 1
pH, UA: 6

## 2015-02-03 MED ORDER — CIPROFLOXACIN HCL 500 MG PO TABS: 500.0000 mg | ORAL_TABLET | Freq: Two times a day (BID) | ORAL | Status: DC

## 2015-02-03 NOTE — Patient Instructions (Signed)
Thank you for choosing [facility].  Summary/Instructions:  Your prescription(s) have been submitted to your pharmacy or been printed and provided for you. Please take as directed and contact our office if you believe you are having problem(s) with the medication(s) or have any questions.  If your symptoms worsen or fail to improve, please contact our office for further instruction, or in case of emergency go directly to the emergency room at the closest medical facility.   Prostatitis The prostate gland is about the size and shape of a walnut. It is located just below your bladder. It produces one of the components of semen, which is made up of sperm and the fluids that help nourish and transport it out from the testicles. Prostatitis is inflammation of the prostate gland.  There are four types of prostatitis:  Acute bacterial prostatitis. This is the least common type of prostatitis. It starts quickly and usually is associated with a bladder infection, high fever, and shaking chills. It can occur at any age.  Chronic bacterial prostatitis. This is a persistent bacterial infection in the prostate. It usually develops from repeated acute bacterial prostatitis or acute bacterial prostatitis that was not properly treated. It can occur in men of any age but is most common in middle-aged men whose prostate has begun to enlarge. The symptoms are not as severe as those in acute bacterial prostatitis. Discomfort in the part of your body that is in front of your rectum and below your scrotum (perineum), lower abdomen, or in the head of your penis (glans) may represent your primary discomfort.  Chronic prostatitis (nonbacterial). This is the most common type of prostatitis. It is inflammation of the prostate gland that is not caused by a bacterial infection. The cause is unknown and may be associated with a viral infection or autoimmune disorder.  Prostatodynia (pelvic floor disorder). This is  associated with increased muscular tone in the pelvis surrounding the prostate. CAUSES The causes of bacterial prostatitis are bacterial infection. The causes of the other types of prostatitis are unknown.  SYMPTOMS  Symptoms can vary depending upon the type of prostatitis that exists. There can also be overlap in symptoms. Possible symptoms for each type of prostatitis are listed below. Acute Bacterial Prostatitis  Painful urination.  Fever or chills.  Muscle or joint pains.  Low back pain.  Low abdominal pain.  Inability to empty bladder completely. Chronic Bacterial Prostatitis, Chronic Nonbacterial Prostatitis, and Prostatodynia  Sudden urge to urinate.  Frequent urination.  Difficulty starting urine stream.  Weak urine stream.  Discharge from the urethra.  Dribbling after urination.  Rectal pain.  Pain in the testicles, penis, or tip of the penis.  Pain in the perineum.  Problems with sexual function.  Painful ejaculation.  Bloody semen. DIAGNOSIS  In order to diagnose prostatitis, your health care provider will ask about your symptoms. One or more urine samples will be taken and tested (urinalysis). If the urinalysis result is negative for bacteria, your health care provider may use a finger to feel your prostate (digital rectal exam). This exam helps your health care provider determine if your prostate is swollen and tender. It will also produce a specimen of semen that can be analyzed. TREATMENT  Treatment for prostatitis depends on the cause. If a bacterial infection is the cause, it can be treated with antibiotic medicine. In cases of chronic bacterial prostatitis, the use of antibiotics for up to 1 month or 6 weeks may be necessary. Your health care  provider may instruct you to take sitz baths to help relieve pain. A sitz bath is a bath of hot water in which your hips and buttocks are under water. This relaxes the pelvic floor muscles and often helps to  relieve the pressure on your prostate. HOME CARE INSTRUCTIONS   Take all medicines as directed by your health care provider.  Take sitz baths as directed by your health care provider. SEEK MEDICAL CARE IF:   Your symptoms get worse, not better.  You have a fever. SEEK IMMEDIATE MEDICAL CARE IF:   You have chills.  You feel nauseous or vomit.  You feel lightheaded or faint.  You are unable to urinate.  You have blood or blood clots in your urine. MAKE SURE YOU:  Understand these instructions.  Will watch your condition.  Will get help right away if you are not doing well or get worse.   This information is not intended to replace advice given to you by your health care provider. Make sure you discuss any questions you have with your health care provider.   Document Released: 03/10/2000 Document Revised: 04/03/2014 Document Reviewed: 09/30/2012 Elsevier Interactive Patient Education 2016 Elsevier Inc.  Urinary Tract Infection Urinary tract infections (UTIs) can develop anywhere along your urinary tract. Your urinary tract is your body's drainage system for removing wastes and extra water. Your urinary tract includes two kidneys, two ureters, a bladder, and a urethra. Your kidneys are a pair of bean-shaped organs. Each kidney is about the size of your fist. They are located below your ribs, one on each side of your spine. CAUSES Infections are caused by microbes, which are microscopic organisms, including fungi, viruses, and bacteria. These organisms are so small that they can only be seen through a microscope. Bacteria are the microbes that most commonly cause UTIs. SYMPTOMS  Symptoms of UTIs may vary by age and gender of the patient and by the location of the infection. Symptoms in young women typically include a frequent and intense urge to urinate and a painful, burning feeling in the bladder or urethra during urination. Older women and men are more likely to be tired,  shaky, and weak and have muscle aches and abdominal pain. A fever may mean the infection is in your kidneys. Other symptoms of a kidney infection include pain in your back or sides below the ribs, nausea, and vomiting. DIAGNOSIS To diagnose a UTI, your caregiver will ask you about your symptoms. Your caregiver will also ask you to provide a urine sample. The urine sample will be tested for bacteria and white blood cells. White blood cells are made by your body to help fight infection. TREATMENT  Typically, UTIs can be treated with medication. Because most UTIs are caused by a bacterial infection, they usually can be treated with the use of antibiotics. The choice of antibiotic and length of treatment depend on your symptoms and the type of bacteria causing your infection. HOME CARE INSTRUCTIONS  If you were prescribed antibiotics, take them exactly as your caregiver instructs you. Finish the medication even if you feel better after you have only taken some of the medication.  Drink enough water and fluids to keep your urine clear or pale yellow.  Avoid caffeine, tea, and carbonated beverages. They tend to irritate your bladder.  Empty your bladder often. Avoid holding urine for long periods of time.  Empty your bladder before and after sexual intercourse.  After a bowel movement, women should cleanse from front to  back. Use each tissue only once. SEEK MEDICAL CARE IF:   You have back pain.  You develop a fever.  Your symptoms do not begin to resolve within 3 days. SEEK IMMEDIATE MEDICAL CARE IF:   You have severe back pain or lower abdominal pain.  You develop chills.  You have nausea or vomiting.  You have continued burning or discomfort with urination. MAKE SURE YOU:   Understand these instructions.  Will watch your condition.  Will get help right away if you are not doing well or get worse.   This information is not intended to replace advice given to you by your health  care provider. Make sure you discuss any questions you have with your health care provider.   Document Released: 12/21/2004 Document Revised: 12/02/2014 Document Reviewed: 04/21/2011 Elsevier Interactive Patient Education [employer].

## 2015-02-03 NOTE — Progress Notes (Signed)
Pre visit review using our clinic review tool, if applicable. No additional management support is needed unless otherwise documented below in the visit note. 

## 2015-02-03 NOTE — Progress Notes (Signed)
Subjective:    Patient ID: Jerry Palmer, male    DOB: [birth year], 69 y.o.   MRN: [id number]  Chief Complaint  Patient presents with  . Dysuria    states he had some dysuria monday and then had some blood in his urine afterwards, thinks he may have passed a kidney stone    HPI:  Jerry Palmer is a 69 y.o. male who  has a past medical history of Hypertension; Hematuria; Diverticulosis of colon; Cancer ([place]); Hyperlipidemia; and Fasting hyperglycemia. and presents today for an acute office visit.   Associated symptom of urinary urgency, dysuria and frequency has been going on for about 4 days. Believed it at the time to be a kidney stone which he was not able to be seen. Temperature max was 102.  Modifying factors include ibuprofen. Denies any recent antibiotic use.   Allergies  Allergen Reactions  . Angiotensin Receptor Blockers     Past medical history of angioedema with ACE inhibitor  . Lisinopril     Lip swelling     Current Outpatient Prescriptions on File Prior to Visit  Medication Sig Dispense Refill  . amLODipine (NORVASC) 5 MG tablet TAKE ONE TABLET BY MOUTH EVERY DAY. 90 tablet 3  . aspirin 81 MG tablet Take 81 mg by mouth daily.      . metoprolol succinate (TOPROL-XL) 25 MG 24 hr tablet TAKE ONE TABLET BY MOUTH EVERY DAY. 90 tablet 3  . Multiple Vitamin (MULTIVITAMIN) tablet Take 1 tablet by mouth daily.      . pravastatin (PRAVACHOL) 40 MG tablet TAKE ONE TABLET BY MOUTH EVERY DAY AT BEDTIME 90 tablet 3   No current facility-administered medications on file prior to visit.     Past Surgical History  Procedure Laterality Date  . Cataract left eye  1995  . Colonoscopy w/ polypectomy       polyp x 1; negative X 2;Dr Sharlett Iles  . Cyst scalp  2005  . Cataract extraction  04/06/14    right eye      Review of Systems  Constitutional: Positive for fever. Negative for chills.  Genitourinary: Positive for dysuria, urgency, frequency, hematuria and penile pain.  Negative for flank pain and testicular pain.      Objective:    BP 138/78 mmHg  Pulse 96  Temp(Src) 99 F (37.2 C) (Oral)  Resp 18  Ht 5\' 8"  (1.727 m)  Wt 169 lb (76.658 kg)  BMI 25.70 kg/m2  SpO2 90% Nursing note and vital signs reviewed.  Physical Exam  Constitutional: He is oriented to person, place, and time. He appears well-developed and well-nourished. No distress.  Cardiovascular: Normal rate, regular rhythm, normal heart sounds and intact distal pulses.   Pulmonary/Chest: Effort normal and breath sounds normal.  Genitourinary: Rectal exam shows no external hemorrhoid, no internal hemorrhoid, no fissure, no mass and no tenderness. Prostate is not enlarged and not tender.  Neurological: He is alert and oriented to person, place, and time.  Skin: Skin is warm and dry.  Psychiatric: He has a normal mood and affect. His behavior is normal. Judgment and thought content normal.       Assessment & Plan:   Problem List Items Addressed This Visit      Other   Dysuria - Primary    In office urinalysis positive for leukocytes, nitrates, and hematuria. Consistent with UTI, however cannot rule out underlying prostatitis. Start ciprofloxacin. Educated regarding signs to seek further care. Follow up if symptoms  worsen or fail to improve.       Relevant Medications   ciprofloxacin (CIPRO) 500 MG tablet   Other Relevant Orders   POCT urinalysis dipstick (Completed)   Urine culture

## 2015-02-03 NOTE — Assessment & Plan Note (Signed)
In office urinalysis positive for leukocytes, nitrates, and hematuria. Consistent with UTI, however cannot rule out underlying prostatitis. Start ciprofloxacin. Educated regarding signs to seek further care. Follow up if symptoms worsen or fail to improve.

## 2015-02-04 ENCOUNTER — Ambulatory Visit: Payer: PPO | Admitting: Internal Medicine

## 2015-02-06 LAB — URINE CULTURE: Colony Count: >=100000

## 2015-02-08 ENCOUNTER — Telehealth: Payer: Self-pay | Admitting: Family

## 2015-02-08 NOTE — Telephone Encounter (Signed)
Please inform patient that his urine culture did show a urinary tract infection and Cipro should have taken care of his symptoms, if not, please let us know.

## 2015-02-10 NOTE — Telephone Encounter (Signed)
Pt's wife called back and I informed her of Greg's note. His symptoms are much better. He's not 100% but he does feel better.

## 2015-02-10 NOTE — Telephone Encounter (Signed)
LVM for pt to call back.

## 2015-02-15 ENCOUNTER — Encounter: Payer: Self-pay | Admitting: Family

## 2015-02-15 ENCOUNTER — Other Ambulatory Visit (INDEPENDENT_AMBULATORY_CARE_PROVIDER_SITE_OTHER): Payer: PPO

## 2015-02-15 ENCOUNTER — Ambulatory Visit (INDEPENDENT_AMBULATORY_CARE_PROVIDER_SITE_OTHER): Payer: PPO | Admitting: Family

## 2015-02-15 ENCOUNTER — Telehealth: Payer: Self-pay | Admitting: Family

## 2015-02-15 VITALS — BP 136/82 | HR 90 | Temp 98.4°F | Resp 18 | Ht 68.0 in | Wt 180.0 lb

## 2015-02-15 DIAGNOSIS — R350 Frequency of micturition: Secondary | ICD-10-CM | POA: Diagnosis not present

## 2015-02-15 LAB — PSA: PSA: 18.79 ng/mL — ABNORMAL HIGH (ref 0.10–4.00)

## 2015-02-15 MED ORDER — TAMSULOSIN HCL 0.4 MG PO CAPS: 0.4000 mg | ORAL_CAPSULE | Freq: Every day | ORAL | Status: DC

## 2015-02-15 MED ORDER — SULFAMETHOXAZOLE-TRIMETHOPRIM 800-160 MG PO TABS
1.0000 | ORAL_TABLET | Freq: Two times a day (BID) | ORAL | Status: DC
Start: 2015-02-15 — End: 2015-02-17

## 2015-02-15 NOTE — Progress Notes (Signed)
Subjective:    Patient ID: Jerry Palmer, male    DOB: 03-20-1946, 69 y.o.   MRN: [id number]  Chief Complaint  Patient presents with  . Urinary Frequency    he still has urinary frequency and urgency, states that he feels like he is going to urinate all over himself but only goes a little, feels like the antibiotics were working he just needs more    HPI:  Jerry Palmer is a 69 y.o. male who  has a past medical history of Hypertension; Hematuria; Diverticulosis of colon; Cancer ([place]); Hyperlipidemia; and Fasting hyperglycemia. and presents today for an acute office visit.   Previously diagnosed with a urinary tract infection confirmed by urine culture with susceptibility to ciprofloxacin. Completed 10 days of antibiotic treatment with some improvement however continues to experience associated symptoms of urinary frequency and urgency with no dysuria. Describes sensation as having to go to the bathroom urgently but only going a little at a time. Denies any fevers, chills, or penile pain/discharge.  Allergies  Allergen Reactions  . Angiotensin Receptor Blockers     Past medical history of angioedema with ACE inhibitor  . Lisinopril     Lip swelling     Current Outpatient Prescriptions on File Prior to Visit  Medication Sig Dispense Refill  . amLODipine (NORVASC) 5 MG tablet TAKE ONE TABLET BY MOUTH EVERY DAY. 90 tablet 3  . aspirin 81 MG tablet Take 81 mg by mouth daily.      . ciprofloxacin (CIPRO) 500 MG tablet Take 1 tablet (500 mg total) by mouth 2 (two) times daily. 20 tablet 0  . metoprolol succinate (TOPROL-XL) 25 MG 24 hr tablet TAKE ONE TABLET BY MOUTH EVERY DAY. 90 tablet 3  . Multiple Vitamin (MULTIVITAMIN) tablet Take 1 tablet by mouth daily.      . pravastatin (PRAVACHOL) 40 MG tablet TAKE ONE TABLET BY MOUTH EVERY DAY AT BEDTIME 90 tablet 3   No current facility-administered medications on file prior to visit.    Review of Systems  Constitutional: Negative  for fever and chills.  Genitourinary: Positive for urgency and frequency. Negative for dysuria, hematuria, discharge, scrotal swelling, penile pain and testicular pain.      Objective:    BP 136/82 mmHg  Pulse 90  Temp(Src) 98.4 F (36.9 C) (Oral)  Resp 18  Ht 5\' 8"  (1.727 m)  Wt 180 lb (81.647 kg)  BMI 27.38 kg/m2  SpO2 96% Nursing note and vital signs reviewed.   Physical Exam  Constitutional: He is oriented to person, place, and time. He appears well-developed and well-nourished. No distress.  Cardiovascular: Normal rate, regular rhythm, normal heart sounds and intact distal pulses.   Pulmonary/Chest: Effort normal and breath sounds normal.  Neurological: He is alert and oriented to person, place, and time.  Skin: Skin is warm and dry.  Psychiatric: He has a normal mood and affect. His behavior is normal. Judgment and thought content normal.       Assessment & Plan:   Problem List Items Addressed This Visit      Other   Urinary frequency - Primary    Urinary urgency with completed course of Ciprofloxacin. Start Bactrim to cover to UTI. Start Flomax to cover for BPH. Obtain PSA. Refer to urology for further assessment of possible urinary frequency as related to BPH.       Relevant Medications   tamsulosin (FLOMAX) 0.4 MG CAPS capsule   sulfamethoxazole-trimethoprim (BACTRIM DS,SEPTRA DS) 800-160 MG tablet  Other Relevant Orders   PSA   Ambulatory referral to Urology

## 2015-02-15 NOTE — Telephone Encounter (Signed)
Pt still not feeling well. Still going to the bathroom several times at night.  I went ahead and scheduled an appointment this afternoon. Just wondering if he should come in or if there is something else that can be called in. His wife can be reached at [phone] till she has to go to work. Please advise

## 2015-02-15 NOTE — Telephone Encounter (Signed)
Please advise 

## 2015-02-15 NOTE — Patient Instructions (Signed)
Thank you for choosing [facility].  Summary/Instructions:  Your prescription(s) have been submitted to your pharmacy or been printed and provided for you. Please take as directed and contact our office if you believe you are having problem(s) with the medication(s) or have any questions.  If your symptoms worsen or fail to improve, please contact our office for further instruction, or in case of emergency go directly to the emergency room at the closest medical facility.     

## 2015-02-15 NOTE — Assessment & Plan Note (Signed)
Urinary urgency with completed course of Ciprofloxacin. Start Bactrim to cover to UTI. Start Flomax to cover for BPH. Obtain PSA. Refer to urology for further assessment of possible urinary frequency as related to BPH.

## 2015-02-15 NOTE — Progress Notes (Signed)
Pre visit review using our clinic review tool, if applicable. No additional management support is needed unless otherwise documented below in the visit note. 

## 2015-02-16 NOTE — Telephone Encounter (Signed)
Seen in office.

## 2015-02-17 ENCOUNTER — Telehealth: Payer: Self-pay | Admitting: Family

## 2015-02-17 DIAGNOSIS — R350 Frequency of micturition: Secondary | ICD-10-CM

## 2015-02-17 MED ORDER — SULFAMETHOXAZOLE-TRIMETHOPRIM 800-160 MG PO TABS: 1.0000 | ORAL_TABLET | Freq: Two times a day (BID) | ORAL | Status: DC

## 2015-02-17 NOTE — Telephone Encounter (Signed)
Spoke with patient regarding elevated PSA. He does report feeling better with the Flomax and the Bactrim. Will extend the Bactrim to 20 days total and follow up with urology.

## 2015-02-22 ENCOUNTER — Encounter: Payer: Self-pay | Admitting: Family

## 2015-03-05 ENCOUNTER — Telehealth: Payer: Self-pay | Admitting: Family

## 2015-03-05 NOTE — Telephone Encounter (Signed)
Pt wife called and said pt took dose of meds for uti and it broke him out .  He stopped the meds and and rash went away and uti seems to be gone but concerned that he still has 10 pills left.   [phone]

## 2015-03-05 NOTE — Telephone Encounter (Signed)
Please advise 

## 2015-03-06 NOTE — Telephone Encounter (Signed)
That should be fine. If his symptoms return then please let us know. I have also added bactrim to his allergy list.

## 2015-03-08 NOTE — Telephone Encounter (Signed)
Pts wife is aware 

## 2015-04-12 ENCOUNTER — Telehealth: Payer: Self-pay

## 2015-04-12 DIAGNOSIS — Z0000 Encounter for general adult medical examination without abnormal findings: Secondary | ICD-10-CM | POA: Diagnosis not present

## 2015-04-12 DIAGNOSIS — R972 Elevated prostate specific antigen [PSA]: Secondary | ICD-10-CM | POA: Diagnosis not present

## 2015-04-12 DIAGNOSIS — R350 Frequency of micturition: Secondary | ICD-10-CM | POA: Diagnosis not present

## 2015-04-12 NOTE — Telephone Encounter (Signed)
Pt wife Rod Holler) lmo triage phone wanted to know the rx that was dc for pt at [facility].  No BP meds dc'ed.   LVM for pt to call back as soon as possible.  RE: more information needed.

## 2015-04-20 ENCOUNTER — Encounter: Payer: Self-pay | Admitting: Gastroenterology

## 2015-06-21 ENCOUNTER — Other Ambulatory Visit: Payer: Self-pay | Admitting: Internal Medicine

## 2015-09-14 DIAGNOSIS — R972 Elevated prostate specific antigen [PSA]: Secondary | ICD-10-CM | POA: Diagnosis not present

## 2015-09-21 DIAGNOSIS — R972 Elevated prostate specific antigen [PSA]: Secondary | ICD-10-CM | POA: Diagnosis not present

## 2015-09-25 ENCOUNTER — Other Ambulatory Visit: Payer: Self-pay | Admitting: Family

## 2015-10-06 ENCOUNTER — Other Ambulatory Visit (INDEPENDENT_AMBULATORY_CARE_PROVIDER_SITE_OTHER): Payer: PPO

## 2015-10-06 ENCOUNTER — Ambulatory Visit (INDEPENDENT_AMBULATORY_CARE_PROVIDER_SITE_OTHER): Payer: PPO | Admitting: Family

## 2015-10-06 ENCOUNTER — Encounter: Payer: Self-pay | Admitting: Family

## 2015-10-06 VITALS — BP 158/90 | HR 76 | Temp 98.3°F | Resp 14 | Ht 68.0 in | Wt 180.0 lb

## 2015-10-06 DIAGNOSIS — E782 Mixed hyperlipidemia: Secondary | ICD-10-CM | POA: Diagnosis not present

## 2015-10-06 DIAGNOSIS — Z0000 Encounter for general adult medical examination without abnormal findings: Secondary | ICD-10-CM | POA: Insufficient documentation

## 2015-10-06 DIAGNOSIS — I10 Essential (primary) hypertension: Secondary | ICD-10-CM | POA: Diagnosis not present

## 2015-10-06 DIAGNOSIS — E119 Type 2 diabetes mellitus without complications: Secondary | ICD-10-CM

## 2015-10-06 LAB — COMPREHENSIVE METABOLIC PANEL
ALBUMIN: 4.4 g/dL (ref 3.5–5.2)
ALK PHOS: 93 U/L (ref 39–117)
ALT: 19 U/L (ref 0–53)
AST: 19 U/L (ref 0–37)
BILIRUBIN TOTAL: 0.8 mg/dL (ref 0.2–1.2)
BUN: 22 mg/dL (ref 6–23)
CO2: 26 mEq/L (ref 19–32)
CREATININE: 1.06 mg/dL (ref 0.40–1.50)
Calcium: 9.4 mg/dL (ref 8.4–10.5)
Chloride: 106 mEq/L (ref 96–112)
GFR: 73.08 mL/min (ref >60.00)
GLUCOSE: 126 mg/dL — AB (ref 70–99)
POTASSIUM: 4.5 meq/L (ref 3.5–5.1)
SODIUM: 139 meq/L (ref 135–145)
TOTAL PROTEIN: 7.2 g/dL (ref 6.0–8.3)

## 2015-10-06 LAB — CBC
HCT: 45.6 % (ref 39.0–52.0)
Hemoglobin: 15.6 g/dL (ref 13.0–17.0)
MCHC: 34.3 g/dL (ref 30.0–36.0)
MCV: 87.8 fl (ref 78.0–100.0)
Platelets: 156 10*3/uL (ref 150.0–400.0)
RBC: 5.19 Mil/uL (ref 4.22–5.81)
RDW: 13.3 % (ref 11.5–15.5)
WBC: 7.8 10*3/uL (ref 4.0–10.5)

## 2015-10-06 LAB — LIPID PANEL
Cholesterol: 166 mg/dL (ref 0–200)
HDL: 37.9 mg/dL — ABNORMAL LOW (ref >39.00)
LDL Cholesterol: 101 mg/dL — ABNORMAL HIGH (ref 0–99)
NONHDL: 128.11
Total CHOL/HDL Ratio: 4
Triglycerides: 134 mg/dL (ref 0.0–149.0)
VLDL: 26.8 mg/dL (ref 0.0–40.0)

## 2015-10-06 LAB — MICROALBUMIN / CREATININE URINE RATIO
Creatinine,U: 152 mg/dL
MICROALB/CREAT RATIO: 0.5 mg/g (ref 0.0–30.0)

## 2015-10-06 LAB — HEMOGLOBIN A1C: HEMOGLOBIN A1C: 6.3 % (ref 4.6–6.5)

## 2015-10-06 NOTE — Assessment & Plan Note (Signed)
Blood pressure elevated today above goal 140/90 with current regimen, however patient endorses having 3 cups of coffee prior to arrival. Encouraged to continue monitoring blood pressure at home and cautious use of caffeinated beverages. Follow low-sodium diet. Continue current dosage of metoprolol and amlodipine.

## 2015-10-06 NOTE — Assessment & Plan Note (Signed)
Type 2 diabetes previously adequately controlled at 6.5 with lifestyle management. Obtain A1c and urine microalbumin. Diabetic foot exam completed today with no abnormalities. Encouraged complete diabetic eye exam at his convenience. Does not currently check blood sugars at home as not necessary at this time. No symptoms of end organ damage noted. Maintained on pravastatin for CAD risk reduction.

## 2015-10-06 NOTE — Assessment & Plan Note (Addendum)
1) Anticipatory Guidance: Discussed importance of wearing a seatbelt while driving and not texting while driving; changing batteries in smoke detector at least once annually; wearing suntan lotion when outside; eating a balanced and moderate diet; getting physical activity at least 30 minutes per day.  2) Immunizations / Screenings / Labs:  All immunizations are up-to-date per recommendations. Due for a vision exam which is scheduled for September. Obtain A1c and urine microalbumin for diabetes screening. Prostate specific antigen is followed by urology. All other screenings are up-to-date per recommendations. Obtain CBC, CMET, and Lipid profile.  Overall well exam with risk factors for cardiovascular disease including type 2 diabetes, hypertension and hyperlipidemia. Chronic conditions currently managed through medication is. He is at risk for skin cancer secondary to work outside and not using protection. Discussed and educated regarding importance of sun protection clothing or lotions to prevent further episodes of skin cancer. Encouraged to follow-up with dermatology for annual skin exam. Continue healthy lifestyle behaviors and choices. Follow-up prevention exam in 1 year. Follow-up office visit for chronic conditions pending blood work.

## 2015-10-06 NOTE — Progress Notes (Signed)
Subjective:    Patient ID: Jerry Palmer, male    DOB: 12/11/1945, 70 y.o.   MRN: [id number]  Chief Complaint  Patient presents with  . CPE    fasting    HPI:  Jerry Palmer is a 70 y.o. male who presents today for an annual wellness visit.   1) Health Maintenance -   Diet - Averages about 2-3 meals per day consisting of fruits, vegetables, beef and chicken. 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 FOOT EXAM  03/14/1954  . OPHTHALMOLOGY EXAM  03/14/1954  . HEMOGLOBIN A1C  11/03/2014  . URINE MICROALBUMIN  05/06/2015  . INFLUENZA VACCINE  10/26/2015  . COLONOSCOPY  05/07/2018  . TETANUS/TDAP  11/20/2018  . ZOSTAVAX  Completed  . PNA vac Low Risk Adult  Completed    Immunization History  Administered Date(s) Administered  . Influenza Split 03/09/2011  . Influenza, High Dose Seasonal PF 01/15/2013, 12/02/2014  . Influenza, Seasonal, Injecte, Preservative Fre 03/12/2012  . Influenza,inj,Quad PF,36+ Mos 01/15/2014  . Pneumococcal Conjugate-13 12/02/2014  . Pneumococcal Polysaccharide-23 01/07/2010  . Td 11/19/2008  . Zoster 03/17/2014     Review of Systems  Constitutional: Denies fever, chills, fatigue, or significant weight gain/loss. HENT: Head: Denies headache or neck pain Ears: Denies changes in hearing, ringing in ears, earache, drainage Nose: Denies discharge, stuffiness, itching, nosebleed, sinus pain Throat: Denies sore throat, hoarseness, dry mouth, sores, thrush Eyes: Denies loss/changes in vision, pain, redness, blurry/double vision, flashing lights Cardiovascular: Denies chest pain/discomfort, tightness, palpitations, shortness of breath with activity, difficulty lying down, swelling, sudden awakening with shortness of breath Respiratory: Denies shortness of  breath, cough, sputum production, wheezing Gastrointestinal: Denies dysphasia, heartburn, change in appetite, nausea, change in bowel habits, rectal bleeding, constipation, diarrhea, yellow skin or eyes Genitourinary: Denies frequency, urgency, burning/pain, blood in urine, incontinence, change in urinary strength. Musculoskeletal: Denies muscle/joint pain, stiffness, back pain, redness or swelling of joints, trauma Skin: Denies rashes, lumps, itching, dryness, color changes, or hair/nail changes Neurological: Denies dizziness, fainting, seizures, weakness, numbness, tingling, tremor Psychiatric - Denies nervousness, stress, depression or memory loss Endocrine: Denies heat or cold intolerance, sweating, frequent urination, excessive thirst, changes in appetite Hematologic: Denies ease of bruising or bleeding     Objective:     BP 158/90 mmHg  Pulse 76  Temp(Src) 98.3 F (36.8 C) (Oral)  Resp 14  Ht 5\' 8"  (1.727 m)  Wt 180 lb (81.647 kg)  BMI 27.38 kg/m2  SpO2 98% Nursing note and vital signs reviewed.  Physical Exam  Constitutional: He is oriented to person, place, and time. He appears well-developed and well-nourished.  HENT:  Head: Normocephalic.  Right Ear: Hearing, tympanic membrane, external ear and ear canal normal.  Left Ear: Hearing, tympanic membrane, external ear and ear canal normal.  Nose: Nose normal.  Mouth/Throat: Uvula is midline, oropharynx is clear and moist and mucous membranes are normal.  Eyes: Conjunctivae and EOM are normal. Pupils are equal, round, and reactive to light.  Neck: Neck supple. No JVD present. No tracheal deviation present. No thyromegaly present.  Cardiovascular: Normal rate, regular rhythm, normal heart sounds and intact distal pulses.   Pulmonary/Chest: Effort normal and breath sounds normal.  Abdominal: Soft. Bowel sounds are normal. He exhibits no distension  and no mass. There is no tenderness. There is no rebound and no guarding.    Musculoskeletal: Normal range of motion. He exhibits no edema or tenderness.  Lymphadenopathy:    He has no cervical adenopathy.  Neurological: He is alert and oriented to person, place, and time. He has normal reflexes. No cranial nerve deficit. He exhibits normal muscle tone. Coordination normal.  Skin: Skin is warm and dry.  Psychiatric: He has a normal mood and affect. His behavior is normal. Judgment and thought content normal.       Assessment & Plan:   Problem List Items Addressed This Visit      Cardiovascular and Mediastinum   Essential hypertension    Blood pressure elevated today above goal 140/90 with current regimen, however patient endorses having 3 cups of coffee prior to arrival. Encouraged to continue monitoring blood pressure at home and cautious use of caffeinated beverages. Follow low-sodium diet. Continue current dosage of metoprolol and amlodipine.      Relevant Medications   pravastatin (PRAVACHOL) 20 MG tablet     Endocrine   Type 2 diabetes mellitus, controlled ([place])    Type 2 diabetes previously adequately controlled at 6.5 with lifestyle management. Obtain A1c and urine microalbumin. Diabetic foot exam completed today with no abnormalities. Encouraged complete diabetic eye exam at his convenience. Does not currently check blood sugars at home as not necessary at this time. No symptoms of end organ damage noted. Maintained on pravastatin for CAD risk reduction.      Relevant Medications   pravastatin (PRAVACHOL) 20 MG tablet   Other Relevant Orders   Hemoglobin A1c   Urine Microalbumin w/creat. ratio     Other   HYPERLIPIDEMIA    Hyperlipidemia currently managed with pravastatin with no adverse side effects or myalgias. Lipid profile obtained to check current status. Continue current dosage of pravastatin pending blood work.      Relevant Medications   pravastatin (PRAVACHOL) 20 MG tablet   Routine adult health maintenance - Primary    1)  Anticipatory Guidance: Discussed importance of wearing a seatbelt while driving and not texting while driving; changing batteries in smoke detector at least once annually; wearing suntan lotion when outside; eating a balanced and moderate diet; getting physical activity at least 30 minutes per day.  2) Immunizations / Screenings / Labs:  All immunizations are up-to-date per recommendations. Due for a vision exam which is scheduled for September. Obtain A1c and urine microalbumin for diabetes screening. Prostate specific antigen is followed by urology. All other screenings are up-to-date per recommendations. Obtain CBC, CMET, and Lipid profile.  Overall well exam with risk factors for cardiovascular disease including type 2 diabetes, hypertension and hyperlipidemia. Chronic conditions currently managed through medication is. He is at risk for skin cancer secondary to work outside and not using protection. Discussed and educated regarding importance of sun protection clothing or lotions to prevent further episodes of skin cancer. Encouraged to follow-up with dermatology for annual skin exam. Continue healthy lifestyle behaviors and choices. Follow-up prevention exam in 1 year. Follow-up office visit for chronic conditions pending blood work.      Relevant Orders   CBC   Comprehensive metabolic panel   Lipid panel   Hemoglobin A1c   Urine Microalbumin w/creat. ratio       I have discontinued Mr. Vangorder ciprofloxacin, tamsulosin, and sulfamethoxazole-trimethoprim. I am also having him maintain his aspirin, multivitamin, amLODipine, metoprolol succinate, and pravastatin.   Meds ordered this encounter  Medications  .  pravastatin (PRAVACHOL) 20 MG tablet    Sig: Take 20 mg by mouth daily.     Follow-up: Return in about 6 months (around 04/07/2016), or if symptoms worsen or fail to improve.   Mauricio Po, FNP

## 2015-10-06 NOTE — Assessment & Plan Note (Signed)
Hyperlipidemia currently managed with pravastatin with no adverse side effects or myalgias. Lipid profile obtained to check current status. Continue current dosage of pravastatin pending blood work.

## 2015-10-06 NOTE — Patient Instructions (Signed)
Thank you for choosing [facility].  Summary/Instructions:  Please continue to take her medications as prescribed   Recommend following up with dermatology for annual skin exam.  Recommend suntan lotion with SPF 30 or higher  Please stop by the lab on the lower level of the building for your blood work. Your results will be released to [facility] (or called to you) after review, usually within 72 hours after test completion. If any changes need to be made, you will be notified at that same time.  1. The lab is open from 7:30am to 5:30 pm Monday-Friday  2. No appointment is necessary  3. Fasting (if needed) is 6-8 hours after food and drink; black coffee  and water are okay   Health Maintenance, Male A healthy lifestyle and preventative care can promote health and wellness.  Maintain regular health, dental, and eye exams.  Eat a healthy diet. Foods like vegetables, fruits, whole grains, low-fat dairy products, and lean protein foods contain the nutrients you need and are low in calories. Decrease your intake of foods high in solid fats, added sugars, and salt. Get information about a proper diet from your health care provider, if necessary.  Regular physical exercise is one of the most important things you can do for your health. Most adults should get at least 150 minutes of moderate-intensity exercise (any activity that increases your heart rate and causes you to sweat) each week. In addition, most adults need muscle-strengthening exercises on 2 or more days a week.   Maintain a healthy weight. The body mass index (BMI) is a screening tool to identify possible weight problems. It provides an estimate of body fat based on height and weight. Your health care provider can find your BMI and can help you achieve or maintain a healthy weight. For males 20 years and older:  A BMI below 18.5 is considered underweight.  A BMI of 18.5 to 24.9 is normal.  A BMI of 25 to 29.9 is considered  overweight.  A BMI of 30 and above is considered obese.  Maintain normal blood lipids and cholesterol by exercising and minimizing your intake of saturated fat. Eat a balanced diet with plenty of fruits and vegetables. Blood tests for lipids and cholesterol should begin at age 59 and be repeated every 5 years. If your lipid or cholesterol levels are high, you are over age 11, or you are at high risk for heart disease, you may need your cholesterol levels checked more frequently.Ongoing high lipid and cholesterol levels should be treated with medicines if diet and exercise are not working.  If you smoke, find out from your health care provider how to quit. If you do not use tobacco, do not start.  Lung cancer screening is recommended for adults aged 20-80 years who are at high risk for developing lung cancer because of a history of smoking. A yearly low-dose CT scan of the lungs is recommended for people who have at least a 30-pack-year history of smoking and are current smokers or have quit within the past 15 years. A pack year of smoking is smoking an average of 1 pack of cigarettes a day for 1 year (for example, a 30-pack-year history of smoking could mean smoking 1 pack a day for 30 years or 2 packs a day for 15 years). Yearly screening should continue until the smoker has stopped smoking for at least 15 years. Yearly screening should be stopped for people who develop a health problem that would  prevent them from having lung cancer treatment.  If you choose to drink alcohol, do not have more than 2 drinks per day. One drink is considered to be 12 oz (360 mL) of beer, 5 oz (150 mL) of wine, or 1.5 oz (45 mL) of liquor.  Avoid the use of street drugs. Do not share needles with anyone. Ask for help if you need support or instructions about stopping the use of drugs.  High blood pressure causes heart disease and increases the risk of stroke. High blood pressure is more likely to develop in:  People  who have blood pressure in the end of the normal range (100-139/85-89 mm Hg).  People who are overweight or obese.  People who are African American.  If you are 50-41 years of age, have your blood pressure checked every 3-5 years. If you are 50 years of age or older, have your blood pressure checked every year. You should have your blood pressure measured twice--once when you are at a hospital or clinic, and once when you are not at a hospital or clinic. Record the average of the two measurements. To check your blood pressure when you are not at a hospital or clinic, you can use:  An automated blood pressure machine at a pharmacy.  A home blood pressure monitor.  If you are 69-17 years old, ask your health care provider if you should take aspirin to prevent heart disease.  Diabetes screening involves taking a blood sample to check your fasting blood sugar level. This should be done once every 3 years after age 24 if you are at a normal weight and without risk factors for diabetes. Testing should be considered at a younger age or be carried out more frequently if you are overweight and have at least 1 risk factor for diabetes.  Colorectal cancer can be detected and often prevented. Most routine colorectal cancer screening begins at the age of 37 and continues through age 68. However, your health care provider may recommend screening at an earlier age if you have risk factors for colon cancer. On a yearly basis, your health care provider may provide home test kits to check for hidden blood in the stool. A small camera at the end of a tube may be used to directly examine the colon (sigmoidoscopy or colonoscopy) to detect the earliest forms of colorectal cancer. Talk to your health care provider about this at age 90 when routine screening begins. A direct exam of the colon should be repeated every 5-10 years through age 12, unless early forms of precancerous polyps or small growths are found.  People  who are at an increased risk for hepatitis B should be screened for this virus. You are considered at high risk for hepatitis B if:  You were born in a country where hepatitis B occurs often. Talk with your health care provider about which countries are considered high risk.  Your parents were born in a high-risk country and you have not received a shot to protect against hepatitis B (hepatitis B vaccine).  You have HIV or AIDS.  You use needles to inject street drugs.  You live with, or have sex with, someone who has hepatitis B.  You are a man who has sex with other men (MSM).  You get hemodialysis treatment.  You take certain medicines for conditions like cancer, organ transplantation, and autoimmune conditions.  Hepatitis C blood testing is recommended for all people born from [birth year] through 1967 and any  individual with known risk factors for hepatitis C.  Healthy men should no longer receive prostate-specific antigen (PSA) blood tests as part of routine cancer screening. Talk to your health care provider about prostate cancer screening.  Testicular cancer screening is not recommended for adolescents or adult males who have no symptoms. Screening includes self-exam, a health care provider exam, and other screening tests. Consult with your health care provider about any symptoms you have or any concerns you have about testicular cancer.  Practice safe sex. Use condoms and avoid high-risk sexual practices to reduce the spread of sexually transmitted infections (STIs).  You should be screened for STIs, including gonorrhea and chlamydia if:  You are sexually active and are younger than 24 years.  You are older than 24 years, and your health care provider tells you that you are at risk for this type of infection.  Your sexual activity has changed since you were last screened, and you are at an increased risk for chlamydia or gonorrhea. Ask your health care provider if you are at  risk.  If you are at risk of being infected with HIV, it is recommended that you take a prescription medicine daily to prevent HIV infection. This is called pre-exposure prophylaxis (PrEP). You are considered at risk if:  You are a man who has sex with other men (MSM).  You are a heterosexual man who is sexually active with multiple partners.  You take drugs by injection.  You are sexually active with a partner who has HIV.  Talk with your health care provider about whether you are at high risk of being infected with HIV. If you choose to begin PrEP, you should first be tested for HIV. You should then be tested every 3 months for as long as you are taking PrEP.  Use sunscreen. Apply sunscreen liberally and repeatedly throughout the day. You should seek shade when your shadow is shorter than you. Protect yourself by wearing long sleeves, pants, a wide-brimmed hat, and sunglasses year round whenever you are outdoors.  Tell your health care provider of new moles or changes in moles, especially if there is a change in shape or color. Also, tell your health care provider if a mole is larger than the size of a pencil eraser.  A one-time screening for abdominal aortic aneurysm (AAA) and surgical repair of large AAAs by ultrasound is recommended for men aged 3-75 years who are current or former smokers.  Stay current with your vaccines (immunizations).   This information is not intended to replace advice given to you by your health care provider. Make sure you discuss any questions you have with your health care provider.   Document Released: 09/09/2007 Document Revised: 04/03/2014 Document Reviewed: 08/08/2010 Elsevier Interactive Patient Education [employer].

## 2015-11-24 DIAGNOSIS — D225 Melanocytic nevi of trunk: Secondary | ICD-10-CM | POA: Diagnosis not present

## 2015-11-24 DIAGNOSIS — L814 Other melanin hyperpigmentation: Secondary | ICD-10-CM | POA: Diagnosis not present

## 2015-11-24 DIAGNOSIS — Z85828 Personal history of other malignant neoplasm of skin: Secondary | ICD-10-CM | POA: Diagnosis not present

## 2015-11-24 DIAGNOSIS — D2271 Melanocytic nevi of right lower limb, including hip: Secondary | ICD-10-CM | POA: Diagnosis not present

## 2015-11-24 DIAGNOSIS — L918 Other hypertrophic disorders of the skin: Secondary | ICD-10-CM | POA: Diagnosis not present

## 2015-11-24 DIAGNOSIS — L570 Actinic keratosis: Secondary | ICD-10-CM | POA: Diagnosis not present

## 2015-11-24 DIAGNOSIS — L812 Freckles: Secondary | ICD-10-CM | POA: Diagnosis not present

## 2015-12-19 ENCOUNTER — Other Ambulatory Visit: Payer: Self-pay | Admitting: Family

## 2015-12-19 ENCOUNTER — Other Ambulatory Visit: Payer: Self-pay | Admitting: Internal Medicine

## 2015-12-19 DIAGNOSIS — E785 Hyperlipidemia, unspecified: Secondary | ICD-10-CM

## 2015-12-24 ENCOUNTER — Other Ambulatory Visit: Payer: Self-pay | Admitting: Internal Medicine

## 2015-12-24 DIAGNOSIS — E785 Hyperlipidemia, unspecified: Secondary | ICD-10-CM

## 2015-12-28 ENCOUNTER — Encounter: Payer: Self-pay | Admitting: Nurse Practitioner

## 2015-12-28 ENCOUNTER — Ambulatory Visit (INDEPENDENT_AMBULATORY_CARE_PROVIDER_SITE_OTHER): Payer: PPO | Admitting: Nurse Practitioner

## 2015-12-28 VITALS — BP 142/82 | HR 78 | Temp 97.8°F | Ht 68.0 in | Wt 181.0 lb

## 2015-12-28 DIAGNOSIS — L509 Urticaria, unspecified: Secondary | ICD-10-CM

## 2015-12-28 DIAGNOSIS — I10 Essential (primary) hypertension: Secondary | ICD-10-CM | POA: Diagnosis not present

## 2015-12-28 MED ORDER — DIPHENHYDRAMINE HCL 25 MG PO TABS: 25.0000 mg | ORAL_TABLET | Freq: Four times a day (QID) | ORAL | 0 refills | Status: DC | PRN

## 2015-12-28 MED ORDER — HYDRALAZINE HCL 25 MG PO TABS: 25.0000 mg | ORAL_TABLET | Freq: Three times a day (TID) | ORAL | 0 refills | Status: DC

## 2015-12-28 MED ORDER — METHYLPREDNISOLONE ACETATE 40 MG/ML IJ SUSP
40.0000 mg | Freq: Once | INTRAMUSCULAR | Status: AC
  Administered 2015-12-28: 40 mg via INTRAMUSCULAR

## 2015-12-28 NOTE — Progress Notes (Signed)
Pre visit review using our clinic review tool, if applicable. No additional management support is needed unless otherwise documented below in the visit note. 

## 2015-12-28 NOTE — Progress Notes (Signed)
Subjective:  Patient ID: Jerry Palmer, male    DOB: 1945-08-10  Age: 70 y.o. MRN: [id number]  CC: Rash (PT STATED HAVE RASH ON  LOWER ABDOMINAL AND LIPS SWELL THAT MIGHT CAUSE MEDICATION.Marland Kitchen)   Rash  This is a new problem. The current episode started in the past 7 days. The problem is unchanged. The rash is diffuse. The rash is characterized by itchiness and swelling. It is unknown if there was an exposure to a precipitant. Associated symptoms include rhinorrhea. Pertinent negatives include no congestion, cough, diarrhea, eye pain, facial edema, fatigue, fever, joint pain, shortness of breath or sore throat. Past treatments include anti-itch cream. The treatment provided mild relief. There is no history of allergies.  denies use of any new medication or dose, no new food item, no new personal products. He is concerned this might be related to one of his medications but unsure which one.  Outpatient Medications Prior to Visit  Medication Sig Dispense Refill  . amLODipine (NORVASC) 5 MG tablet TAKE ONE TABLET BY MOUTH ONCE DAILY. NEED OFFICE VISIT WITH GREG CALONE FOR FURTHER REFILLS. 90 tablet 0  . aspirin 81 MG tablet Take 81 mg by mouth daily.      . metoprolol succinate (TOPROL-XL) 25 MG 24 hr tablet TAKE ONE TABLET BY MOUTH ONCE DAILY.  NEED  OFFICE  WITH  GREG  CALONE  FOR  MORE  REFILLS. 90 tablet 0  . Multiple Vitamin (MULTIVITAMIN) tablet Take 1 tablet by mouth daily.      . pravastatin (PRAVACHOL) 20 MG tablet Take 20 mg by mouth daily.     No facility-administered medications prior to visit.     ROS See HPI  Objective:  BP (!) 142/82 (BP Location: Left Arm, Patient Position: Sitting, Cuff Size: Normal)   Pulse 78   Temp 97.8 F (36.6 C)   Ht 5\' 8"  (1.727 m)   Wt 181 lb (82.1 kg)   SpO2 (!) 86%   BMI 27.52 kg/m   BP Readings from Last 3 Encounters:  12/28/15 (!) 142/82  10/06/15 (!) 158/90  02/15/15 136/82    Wt Readings from Last 3 Encounters:  12/28/15 181 lb  (82.1 kg)  10/06/15 180 lb (81.6 kg)  02/15/15 180 lb (81.6 kg)    Physical Exam  Constitutional: He is oriented to person, place, and time.  HENT:  Right Ear: External ear normal.  Left Ear: External ear normal.  Nose: Nose normal.  Mouth/Throat: Oropharynx is clear and moist.  Eyes: Conjunctivae and EOM are normal. Pupils are equal, round, and reactive to light.  Neck: Normal range of motion. Neck supple.  Neurological: He is alert and oriented to person, place, and time.  Skin: Skin is warm and dry. Rash noted. Rash is urticarial. There is erythema.     Psychiatric: He has a normal mood and affect. His behavior is normal.    Lab Results  Component Value Date   WBC 7.8 10/06/2015   HGB 15.6 10/06/2015   HCT 45.6 10/06/2015   PLT 156.0 10/06/2015   GLUCOSE 126 (H) 10/06/2015   CHOL 166 10/06/2015   TRIG 134.0 10/06/2015   HDL 37.90 (L) 10/06/2015   LDLDIRECT 157.4 12/30/2009   LDLCALC 101 (H) 10/06/2015   ALT 19 10/06/2015   AST 19 10/06/2015   NA 139 10/06/2015   K 4.5 10/06/2015   CL 106 10/06/2015   CREATININE 1.06 10/06/2015   BUN 22 10/06/2015   CO2 26 10/06/2015  TSH 3.15 05/05/2014   PSA 18.79 (H) 02/15/2015   HGBA1C 6.3 10/06/2015   MICROALBUR <0.7 10/06/2015    No results found.  Assessment & Plan:   Chet was seen today for rash.  Diagnoses and all orders for this visit:  Hives -     methylPREDNISolone acetate (DEPO-MEDROL) injection 40 mg; Inject 1 mL (40 mg total) into the muscle once. -     Discontinue: diphenhydrAMINE (BENADRYL) 25 MG tablet; Take 1 tablet (25 mg total) by mouth every 6 (six) hours as needed.  Essential hypertension -     hydrALAZINE (APRESOLINE) 25 MG tablet; Take 1 tablet (25 mg total) by mouth 3 (three) times daily.   I have discontinued Mr. Yamazaki aspirin, multivitamin, pravastatin, amLODipine, metoprolol succinate, and diphenhydrAMINE. I am also having him start on hydrALAZINE. We administered methylPREDNISolone  acetate.  Meds ordered this encounter  Medications  . methylPREDNISolone acetate (DEPO-MEDROL) injection 40 mg  . DISCONTD: diphenhydrAMINE (BENADRYL) 25 MG tablet    Sig: Take 1 tablet (25 mg total) by mouth every 6 (six) hours as needed.    Dispense:  30 tablet    Refill:  0    Order Specific Question:   Supervising Provider    Answer:   Cassandria Anger [[id number]]  . hydrALAZINE (APRESOLINE) 25 MG tablet    Sig: Take 1 tablet (25 mg total) by mouth 3 (three) times daily.    Dispense:  42 tablet    Refill:  0    Order Specific Question:   Supervising Provider    Answer:   Cassandria Anger [[id number]]    Follow-up: Return in about 2 weeks (around 01/11/2016) for HTN and allergic rash.  Wilfred Lacy, NP

## 2015-12-28 NOTE — Patient Instructions (Addendum)
Use benadryl as needed. Hold all medications except new BP medication (hydralazine). Check BP once a day and record. Bring BP records to next office visit.  On Monday 01/03/16, resume pravastatin at previously prescribed. On Wednesday 01/05/16, resume aspirin as prescribed. On Friday 01/07/16, resume multivitamin.  Do not resume Norvasc or metoprolol till next office visit.

## 2016-01-04 ENCOUNTER — Other Ambulatory Visit: Payer: Self-pay | Admitting: Internal Medicine

## 2016-01-04 DIAGNOSIS — E785 Hyperlipidemia, unspecified: Secondary | ICD-10-CM

## 2016-01-05 ENCOUNTER — Ambulatory Visit (INDEPENDENT_AMBULATORY_CARE_PROVIDER_SITE_OTHER): Payer: PPO

## 2016-01-05 DIAGNOSIS — Z961 Presence of intraocular lens: Secondary | ICD-10-CM | POA: Diagnosis not present

## 2016-01-05 DIAGNOSIS — Z23 Encounter for immunization: Secondary | ICD-10-CM

## 2016-01-05 LAB — HM DIABETES EYE EXAM

## 2016-01-07 ENCOUNTER — Telehealth: Payer: Self-pay

## 2016-01-07 NOTE — Telephone Encounter (Signed)
Received a refill request from pharmacy for pravastatin. I do not see that listed on his current medication list. Did just have a lipid panel done on 10/06/15. Please advise on the refill.

## 2016-01-10 NOTE — Telephone Encounter (Signed)
Patient Name: Jerry Palmer  DOB: May 05, 1945    Initial Comment Caller states Friday husband broke out, has swelling all over, including face.   Nurse Assessment  Nurse: Leilani Merl, RN, Heather Date/Time (Eastern Time): 01/10/2016 8:23:33 AM  Confirm and document reason for call. If symptomatic, describe symptoms. You must click the next button to save text entered. ---Caller states Friday husband broke out all over, has swelling all over, including face. Today he still has the hives everywhere and his face and lip are still swollen  Has the patient traveled out of the country within the last 30 days? ---Not Applicable  Does the patient have any new or worsening symptoms? ---Yes  Will a triage be completed? ---Yes  Related visit to physician within the last 2 weeks? ---Yes  Does the PT have any chronic conditions? (i.e. diabetes, asthma, etc.) ---Yes  List chronic conditions. ---See MR  Is this a behavioral health or substance abuse call? ---No     Guidelines    Guideline Title Affirmed Question Affirmed Notes  Face Swelling Widespread rash on body    Final Disposition User   See Physician within 4 Hours (or PCP triage) Standifer, RN, Nira Conn    Comments  There are no appts at the office that triager can find. Caller states that she wants her husband seen at the office, she would like for him to be worked in this morning.   Referrals  REFERRED TO PCP OFFICE   Disagree/Comply: Comply

## 2016-01-10 NOTE — Telephone Encounter (Signed)
Called pt to let him know the response. Going to try OTC benedryl or allergy medication until he is seen tomorrow morning by Marya Amsler.

## 2016-01-10 NOTE — Telephone Encounter (Signed)
I would recommend he go to Urgent Care as this sounds like an allergic reaction.

## 2016-01-11 ENCOUNTER — Encounter: Payer: Self-pay | Admitting: Family

## 2016-01-11 ENCOUNTER — Ambulatory Visit (INDEPENDENT_AMBULATORY_CARE_PROVIDER_SITE_OTHER): Payer: PPO | Admitting: Family

## 2016-01-11 DIAGNOSIS — L509 Urticaria, unspecified: Secondary | ICD-10-CM

## 2016-01-11 MED ORDER — PREDNISONE 10 MG (21) PO TBPK: ORAL_TABLET | ORAL | 0 refills | Status: DC

## 2016-01-11 NOTE — Progress Notes (Signed)
Subjective:    Patient ID: Jerry Palmer, male    DOB: [birth year], 70 y.o.   MRN: [id number]  Chief Complaint  Patient presents with  . Follow-up    has been having hives on and off for a couple of weeks, stopped taking all medications, still having itching and hives    HPI:  Jerry Palmer is a 70 y.o. male who  has a past medical history of Cancer (Between); Diverticulosis of colon; Fasting hyperglycemia; Hematuria; Hyperlipidemia; and Hypertension. and presents today for an office visit.  Previously evaluated in the office for a rash and found to have a rash that was diagnosed as hives and treated with an injection of depomedrol and benedryl. At the time it was an unknown origin. His symptoms went away initially and then have come back. He continues to experience the associated symptoms of hives that wax and wane now for the past couple of weeks. There is no lip swelling this morning but does continue to have rash on his trunk. He has also stopped taking all medications with the exception of Benedryl. No trouble breathing or other signs of anaphylaxis.   Allergies  Allergen Reactions  . Angiotensin Receptor Blockers     Past medical history of angioedema with ACE inhibitor  . Lisinopril     Lip swelling  . Bactrim [Sulfamethoxazole-Trimethoprim] Rash      Outpatient Medications Prior to Visit  Medication Sig Dispense Refill  . hydrALAZINE (APRESOLINE) 25 MG tablet Take 1 tablet (25 mg total) by mouth 3 (three) times daily. 42 tablet 0   No facility-administered medications prior to visit.      Review of Systems  Constitutional: Negative for chills and fever.  Respiratory: Negative for chest tightness, shortness of breath and wheezing.   Cardiovascular: Negative for chest pain, palpitations and leg swelling.  Skin: Positive for rash.      Objective:    BP (!) 154/94 (BP Location: Left Arm, Patient Position: Sitting, Cuff Size: Normal)   Pulse 83   Temp 98.6 F (37  C) (Oral)   Resp 16   Ht 5\' 8"  (1.727 m)   Wt 182 lb (82.6 kg)   SpO2 96%   BMI 27.67 kg/m  Nursing note and vital signs reviewed.  Physical Exam  Constitutional: He is oriented to person, place, and time. He appears well-developed and well-nourished. No distress.  Cardiovascular: Normal rate, regular rhythm, normal heart sounds and intact distal pulses.   Pulmonary/Chest: Effort normal and breath sounds normal. He has no wheezes. He has no rales. He exhibits no tenderness.  Neurological: He is alert and oriented to person, place, and time.  Skin: Skin is warm and dry.  Diffuse rash appears with hives located on trunk and arms.   Psychiatric: He has a normal mood and affect. His behavior is normal. Judgment and thought content normal.       Assessment & Plan:   Problem List Items Addressed This Visit      Musculoskeletal and Integument   Hives     Symptoms and exam consistent with allergic reaction possibly to medication. Appears stable with no signs of anaphylaxis. Hold pravastatin and hydralazine. Start prednisone taper. Restart pravastatin at completion of prednisone. Then restart blood pressure medication. If reaction occurs blood pressure medications will be adjusted.       Relevant Medications   predniSONE (STERAPRED UNI-PAK 21 TAB) 10 MG (21) TBPK tablet    Other Visit Diagnoses   None.  I am having Jerry Palmer start on predniSONE. I am also having him maintain his hydrALAZINE.   Meds ordered this encounter  Medications  . predniSONE (STERAPRED UNI-PAK 21 TAB) 10 MG (21) TBPK tablet    Sig: Take 6 tablets x 1 day, 5 tablets x 1 day, 4 tablets x 1 day, 3 tablets x 1 day, 2 tablets x 1 day, 1 tablet x 1 day    Dispense:  21 tablet    Refill:  0    Order Specific Question:   Supervising Provider    Answer:   Pricilla Holm A [id number]     Follow-up: Return if symptoms worsen or fail to improve.  Mauricio Po, FNP

## 2016-01-11 NOTE — Assessment & Plan Note (Signed)
Symptoms and exam consistent with allergic reaction possibly to medication. Appears stable with no signs of anaphylaxis. Hold pravastatin and hydralazine. Start prednisone taper. Restart pravastatin at completion of prednisone. Then restart blood pressure medication. If reaction occurs blood pressure medications will be adjusted.

## 2016-01-11 NOTE — Patient Instructions (Signed)
Thank you for choosing [facility].  SUMMARY AND INSTRUCTIONS:  Stop your medications while on prednisone.   Restart your cholesterol medication (pravastatin) first and then increase back to blood pressure medication.  Medication:  Start the prednisone.  Your prescription(s) have been submitted to your pharmacy or been printed and provided for you. Please take as directed and contact our office if you believe you are having problem(s) with the medication(s) or have any questions.  Follow up:  If your symptoms worsen or fail to improve, please contact our office for further instruction, or in case of emergency go directly to the emergency room at the closest medical facility.

## 2016-03-07 DIAGNOSIS — R972 Elevated prostate specific antigen [PSA]: Secondary | ICD-10-CM | POA: Diagnosis not present

## 2016-06-02 LAB — HM DIABETES EYE EXAM

## 2016-06-29 ENCOUNTER — Telehealth: Payer: Self-pay | Admitting: *Deleted

## 2016-06-29 NOTE — Telephone Encounter (Signed)
Wife left msg on triage requesting refill on husband BP med. called wife back to verify medication per chart no BP med on file. No answer LMOM RTC...Jerry Palmer

## 2016-07-03 ENCOUNTER — Other Ambulatory Visit: Payer: Self-pay | Admitting: Family

## 2016-07-03 NOTE — Telephone Encounter (Signed)
Wife called back.  States patient is currently taking amlodipine and metoprolol.  Patient uses [facility] in [place].  Please follow up in regard with patient at [phone].

## 2016-07-04 ENCOUNTER — Telehealth: Payer: Self-pay | Admitting: General Practice

## 2016-07-04 MED ORDER — AMLODIPINE BESYLATE 5 MG PO TABS: 5.0000 mg | ORAL_TABLET | Freq: Every day | ORAL | 0 refills | Status: DC

## 2016-07-04 NOTE — Telephone Encounter (Signed)
Jerry Palmer called me back and confirmed that Dr. Quay Burow is her PCP and Dr. Elna Breslow is her husband's PCP. [name] ([name])

## 2016-07-04 NOTE — Addendum Note (Signed)
Addended by: Delice Bison E on: 07/04/2016 10:00 AM   Modules accepted: Orders

## 2016-07-04 NOTE — Telephone Encounter (Signed)
Amlodipine has been filled. Metoprolol has not been filled in 6 + months. Wife is aware. Scheduled an appointment for pt for a physical in July.

## 2016-07-04 NOTE — Telephone Encounter (Signed)
I called the patient to confirm that Dr. Linna Darner is no longer his PCP, but there was no answer. Jerry Palmer ([place])

## 2016-09-07 DIAGNOSIS — R972 Elevated prostate specific antigen [PSA]: Secondary | ICD-10-CM | POA: Diagnosis not present

## 2016-10-03 DIAGNOSIS — R972 Elevated prostate specific antigen [PSA]: Secondary | ICD-10-CM | POA: Diagnosis not present

## 2016-10-04 ENCOUNTER — Encounter: Payer: Self-pay | Admitting: Family

## 2016-10-04 ENCOUNTER — Other Ambulatory Visit (INDEPENDENT_AMBULATORY_CARE_PROVIDER_SITE_OTHER): Payer: PPO

## 2016-10-04 ENCOUNTER — Ambulatory Visit (INDEPENDENT_AMBULATORY_CARE_PROVIDER_SITE_OTHER): Payer: PPO | Admitting: Family

## 2016-10-04 VITALS — BP 120/70 | HR 74 | Temp 98.5°F | Resp 16 | Ht 68.0 in | Wt 181.4 lb

## 2016-10-04 DIAGNOSIS — E782 Mixed hyperlipidemia: Secondary | ICD-10-CM | POA: Diagnosis not present

## 2016-10-04 DIAGNOSIS — E119 Type 2 diabetes mellitus without complications: Secondary | ICD-10-CM

## 2016-10-04 DIAGNOSIS — I10 Essential (primary) hypertension: Secondary | ICD-10-CM

## 2016-10-04 DIAGNOSIS — Z0000 Encounter for general adult medical examination without abnormal findings: Secondary | ICD-10-CM

## 2016-10-04 LAB — LIPID PANEL
CHOL/HDL RATIO: 6
Cholesterol: 216 mg/dL — ABNORMAL HIGH (ref 0–200)
HDL: 37.1 mg/dL — ABNORMAL LOW (ref >39.00)
LDL CALC: 142 mg/dL — AB (ref 0–99)
NonHDL: 178.93
Triglycerides: 187 mg/dL — ABNORMAL HIGH (ref 0.0–149.0)
VLDL: 37.4 mg/dL (ref 0.0–40.0)

## 2016-10-04 LAB — COMPREHENSIVE METABOLIC PANEL
ALT: 23 U/L (ref 0–53)
AST: 18 U/L (ref 0–37)
Albumin: 4.4 g/dL (ref 3.5–5.2)
Alkaline Phosphatase: 88 U/L (ref 39–117)
BUN: 19 mg/dL (ref 6–23)
CHLORIDE: 105 meq/L (ref 96–112)
CO2: 28 meq/L (ref 19–32)
CREATININE: 1.11 mg/dL (ref 0.40–1.50)
Calcium: 9.7 mg/dL (ref 8.4–10.5)
GFR: 69.1 mL/min (ref >60.00)
Glucose, Bld: 142 mg/dL — ABNORMAL HIGH (ref 70–99)
POTASSIUM: 4.4 meq/L (ref 3.5–5.1)
SODIUM: 140 meq/L (ref 135–145)
Total Bilirubin: 0.9 mg/dL (ref 0.2–1.2)
Total Protein: 7.3 g/dL (ref 6.0–8.3)

## 2016-10-04 LAB — CBC
HCT: 47.6 % (ref 39.0–52.0)
Hemoglobin: 16.4 g/dL (ref 13.0–17.0)
MCHC: 34.5 g/dL (ref 30.0–36.0)
MCV: 88.3 fl (ref 78.0–100.0)
Platelets: 165 10*3/uL (ref 150.0–400.0)
RBC: 5.39 Mil/uL (ref 4.22–5.81)
RDW: 13.6 % (ref 11.5–15.5)
WBC: 7.4 10*3/uL (ref 4.0–10.5)

## 2016-10-04 LAB — HEMOGLOBIN A1C: Hgb A1c MFr Bld: 6.8 % — ABNORMAL HIGH (ref 4.6–6.5)

## 2016-10-04 NOTE — Assessment & Plan Note (Addendum)
Reviewed and updated patient's medical, surgical, family and social history. Medications and allergies were also reviewed. Basic screenings for depression, activities of daily living, hearing, cognition and safety were performed. Provider list was updated and health plan was provided to the patient.   Overall well exam with risk factors for cardiovascular disease including hyperlipidemia, type 2 diabetes, and hypertension. Chronic conditions appear adequately controlled with current lifestyle management and medication regimens. PSA remains checked by urology. He exercises on a regular basis and is eating a moderate, balance, and varied nutritional intake. Continue healthy lifestyle behaviors and choices. Follow-up prevention exam in 1 year. Follow-up office visit pending blood work.

## 2016-10-04 NOTE — Assessment & Plan Note (Signed)
Blood pressure well controlled with current medication regimen and no adverse side effects. Discontinue amlodipine 1 month. Continue to monitor blood pressures at home and follow low-sodium diet.

## 2016-10-04 NOTE — Assessment & Plan Note (Signed)
Currently maintained with lifestyle and no cardiac symptoms. Obtain lipid profile. Continue lifestyle management pending lipid profile results.

## 2016-10-04 NOTE — Assessment & Plan Note (Signed)
Previous A1c well-controlled and not currently maintained on medication. Obtain hemoglobin A1c. Diabetic foot exam completed today and normal. Pneumovax is up-to-date per recommendations. Not currently maintained on Ace/ARB for CAD risk reduction. Diabetic eye exam is up-to-date per recommendations. Continue lifestyle management pending A1c results.

## 2016-10-04 NOTE — Patient Instructions (Addendum)
Thank you for choosing [facility].  SUMMARY AND INSTRUCTIONS:  Please STOP taking the amlodipine.  Continue to monitor your blood pressure at home.   Labs:  Please stop by the lab on the lower level of the building for your blood work. Your results will be released to [place] (or called to you) after review, usually within 72 hours after test completion. If any changes need to be made, you will be notified at that same time.  1.) The lab is open from 7:30am to 5:30 pm Monday-Friday 2.) No appointment is necessary 3.) Fasting (if needed) is 6-8 hours after food and drink; black coffee and water are okay   Follow up:  If your symptoms worsen or fail to improve, please contact our office for further instruction, or in case of emergency go directly to the emergency room at the closest medical facility.    Health Maintenance  Topic Date Due  . FOOT EXAM  03/14/1954  . OPHTHALMOLOGY EXAM  03[birth year]14/1956  . HEMOGLOBIN A1C  04/07/2016  . URINE MICROALBUMIN  10/05/2016  . INFLUENZA VACCINE  10/25/2016  . COLONOSCOPY  05/07/2018  . TETANUS/TDAP  11/20/2018  . PNA vac Low Risk Adult  Completed    Health Maintenance, Male A healthy lifestyle and preventive care is important for your health and wellness. Ask your health care provider about what schedule of regular examinations is right for you. What should I know about weight and diet? Eat a Healthy Diet  Eat plenty of vegetables, fruits, whole grains, low-fat dairy products, and lean protein.  Do not eat a lot of foods high in solid fats, added sugars, or salt.  Maintain a Healthy Weight Regular exercise can help you achieve or maintain a healthy weight. You should:  Do at least 150 minutes of exercise each week. The exercise should increase your heart rate and make you sweat (moderate-intensity exercise).  Do strength-training exercises at least twice a week.  Watch Your Levels of Cholesterol and Blood Lipids  Have your  blood tested for lipids and cholesterol every 5 years starting at 73 years of age. If you are at high risk for heart disease, you should start having your blood tested when you are 73 years old. You may need to have your cholesterol levels checked more often if: ? Your lipid or cholesterol levels are high. ? You are older than 73 years of age. ? You are at high risk for heart disease.  What should I know about cancer screening? Many types of cancers can be detected early and may often be prevented. Lung Cancer  You should be screened every year for lung cancer if: ? You are a current smoker who has smoked for at least 30 years. ? You are a former smoker who has quit within the past 15 years.  Talk to your health care provider about your screening options, when you should start screening, and how often you should be screened.  Colorectal Cancer  Routine colorectal cancer screening usually begins at 73 years of age and should be repeated every 5-10 years until you are 73 years old. You may need to be screened more often if early forms of precancerous polyps or small growths are found. Your health care provider may recommend screening at an earlier age if you have risk factors for colon cancer.  Your health care provider may recommend using home test kits to check for hidden blood in the stool.  A small camera at the end of  a tube can be used to examine your colon (sigmoidoscopy or colonoscopy). This checks for the earliest forms of colorectal cancer.  Prostate and Testicular Cancer  Depending on your age and overall health, your health care provider may do certain tests to screen for prostate and testicular cancer.  Talk to your health care provider about any symptoms or concerns you have about testicular or prostate cancer.  Skin Cancer  Check your skin from head to toe regularly.  Tell your health care provider about any new moles or changes in moles, especially if: ? There is a  change in a mole's size, shape, or color. ? You have a mole that is larger than a pencil eraser.  Always use sunscreen. Apply sunscreen liberally and repeat throughout the day.  Protect yourself by wearing long sleeves, pants, a wide-brimmed hat, and sunglasses when outside.  What should I know about heart disease, diabetes, and high blood pressure?  If you are 65-79 years of age, have your blood pressure checked every 3-5 years. If you are 12 years of age or older, have your blood pressure checked every year. You should have your blood pressure measured twice-once when you are at a hospital or clinic, and once when you are not at a hospital or clinic. Record the average of the two measurements. To check your blood pressure when you are not at a hospital or clinic, you can use: ? An automated blood pressure machine at a pharmacy. ? A home blood pressure monitor.  Talk to your health care provider about your target blood pressure.  If you are between 48-49 years old, ask your health care provider if you should take aspirin to prevent heart disease.  Have regular diabetes screenings by checking your fasting blood sugar level. ? If you are at a normal weight and have a low risk for diabetes, have this test once every three years after the age of 23. ? If you are overweight and have a high risk for diabetes, consider being tested at a younger age or more often.  A one-time screening for abdominal aortic aneurysm (AAA) by ultrasound is recommended for men aged 38-75 years who are current or former smokers. What should I know about preventing infection? Hepatitis B If you have a higher risk for hepatitis B, you should be screened for this virus. Talk with your health care provider to find out if you are at risk for hepatitis B infection. Hepatitis C Blood testing is recommended for:  Everyone born from [birth year] through 1967.  Anyone with known risk factors for hepatitis C.  Sexually Transmitted  Diseases (STDs)  You should be screened each year for STDs including gonorrhea and chlamydia if: ? You are sexually active and are younger than 73 years of age. ? You are older than 73 years of age and your health care provider tells you that you are at risk for this type of infection. ? Your sexual activity has changed since you were last screened and you are at an increased risk for chlamydia or gonorrhea. Ask your health care provider if you are at risk.  Talk with your health care provider about whether you are at high risk of being infected with HIV. Your health care provider may recommend a prescription medicine to help prevent HIV infection.  What else can I do?  Schedule regular health, dental, and eye exams.  Stay current with your vaccines (immunizations).  Do not use any tobacco products, such as cigarettes, chewing  tobacco, and e-cigarettes. If you need help quitting, ask your health care provider.  Limit alcohol intake to no more than 2 drinks per day. One drink equals 12 ounces of beer, 5 ounces of wine, or 1 ounces of hard liquor.  Do not use street drugs.  Do not share needles.  Ask your health care provider for help if you need support or information about quitting drugs.  Tell your health care provider if you often feel depressed.  Tell your health care provider if you have ever been abused or do not feel safe at home. This information is not intended to replace advice given to you by your health care provider. Make sure you discuss any questions you have with your health care provider. Document Released: 09/09/2007 Document Revised: 11/10/2015 Document Reviewed: 12/15/2014 Elsevier Interactive Patient Education  [employer].

## 2016-10-04 NOTE — Progress Notes (Signed)
Subjective:    Patient ID: Jerry Palmer, male    DOB: February 16, 1946, 71 y.o.   MRN: [id number]  Chief Complaint  Patient presents with  . CPE    fasting     HPI:  Jerry Palmer is a 71 y.o. male who presents today for a Medicare Annual Wellness/Physical exam.    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 FOOT EXAM  03/14/1954  . OPHTHALMOLOGY EXAM  03/14/1954  . HEMOGLOBIN A1C  04/07/2016  . URINE MICROALBUMIN  10/05/2016  . INFLUENZA VACCINE  10/25/2016  . COLONOSCOPY  05/07/2018  . TETANUS/TDAP  11/20/2018  . PNA vac Low Risk Adult  Completed     Immunization History  Administered Date(s) Administered  . Influenza Split 03/09/2011  . Influenza, High Dose Seasonal PF 01/15/2013, 12/02/2014, 01/05/2016  . Influenza, Seasonal, Injecte, Preservative Fre 03/12/2012  . Influenza,inj,Quad PF,36+ Mos 01/15/2014  . Pneumococcal Conjugate-13 12/02/2014  . Pneumococcal Polysaccharide-23 01/07/2010  . Td 11/19/2008  . Zoster 03/17/2014    RISK FACTORS  Tobacco History  Smoking Status  . Former Smoker  . Types: Cigarettes  . Quit date: 03/27/1978  Smokeless Tobacco  . Never Used    Comment: smoked [place], up to 1.5 ppd      Cardiac risk factors: advanced age (older than 100 for men, 76 for women), diabetes mellitus, dyslipidemia, hypertension and male gender.  Depression Screen  Depression screen [facility] 2/9 10/06/2015  Decreased Interest 0  Down, Depressed, Hopeless 0  PHQ - 2 Score 0     Activities of Daily Living In your present state of health, do you have any difficulty performing the following activities?:  Driving? No Managing money?  No Feeding yourself? No Getting from bed  to chair? No Climbing a flight of stairs? No Preparing food and eating?: No Bathing or showering? No Getting dressed: No Getting to the toilet? No Using the toilet: No Moving around from place to place: No In the past year have you fallen or had a near fall?:No   Home Safety Has smoke detector and wears seat belts. No firearms. No excess sun exposure. Are there smokers in your home (other than you)?  No Do you feel safe at home?  Yes  Hearing Difficulties: No Do you often ask people to speak up or repeat themselves? No Do you experience ringing or noises in your ears? No  Do you have difficulty understanding soft or whispered voices? No    Cognitive Testing  Alert? Yes   Normal Appearance? Yes  Oriented to person? Yes  Place? Yes   Time? Yes  Recall of three objects?  Yes  Can perform simple calculations? Yes  Displays appropriate judgment? Yes  Can read the correct time from a watch face? Yes  Do you feel that you have a problem with memory? No  Do you often misplace items? No   Advanced Directives have been discussed with the patient? Yes   Current Physicians/Providers and Suppliers  1. Terri Piedra, FNP - Internal Medicine 2. Consepcion Hearing, MD - Urology  Indicate any recent Medical Services you may have received from other than [facility] providers  in the past year (date may be approximate).  All answers were reviewed with the patient and necessary referrals were made:  Mauricio Po, FNP   10/04/2016    Allergies  Allergen Reactions  . Angiotensin Receptor Blockers     Past medical history of angioedema with ACE inhibitor  . Lisinopril     Lip swelling  . Bactrim [Sulfamethoxazole-Trimethoprim] Rash     Outpatient Medications Prior to Visit  Medication Sig Dispense Refill  . amLODipine (NORVASC) 5 MG tablet Take 1 tablet (5 mg total) by mouth daily. 90 tablet 0  . hydrALAZINE (APRESOLINE) 25 MG tablet Take 1 tablet (25 mg total) by mouth 3 (three) times  daily. 42 tablet 0  . predniSONE (STERAPRED UNI-PAK 21 TAB) 10 MG (21) TBPK tablet Take 6 tablets x 1 day, 5 tablets x 1 day, 4 tablets x 1 day, 3 tablets x 1 day, 2 tablets x 1 day, 1 tablet x 1 day 21 tablet 0   No facility-administered medications prior to visit.      Past Medical History:  Diagnosis Date  . Cancer (HCC)    Basal Cell  . Diverticulosis of colon   . Fasting hyperglycemia   . Hematuria    PMH of  . Hyperlipidemia   . Hypertension      Past Surgical History:  Procedure Laterality Date  . CATARACT EXTRACTION  04/06/14   right eye  . cataract left eye  1995  . COLONOSCOPY W/ POLYPECTOMY      polyp x 1; negative X 2;Dr Sharlett Iles  . cyst scalp  2005     Family History  Problem Relation Age of Onset  . Cancer Brother        ?spinal mets  . Colon cancer Brother 64  . Colon cancer Brother 23  . Colon cancer Brother 24  . Breast cancer Mother   . Hypertension Mother   . Diabetes Sister   . Stroke Maternal Grandfather         > 55  . Heart disease Neg Hx      Social History   Social History  . Marital status: Married    Spouse name: N/A  . Number of children: 0  . Years of education: 8   Occupational History  . Not on file.   Social History Main Topics  . Smoking status: Former Smoker    Types: Cigarettes    Quit date: 03/27/1978  . Smokeless tobacco: Never Used     Comment: smoked [place], up to 1.5 ppd   . Alcohol use No  . Drug use: No  . Sexual activity: Not on file   Other Topics Concern  . Not on file   Social History Narrative   Fun: Camping      Review of Systems  Constitutional: Denies fever, chills, fatigue, or significant weight gain/loss. HENT: Head: Denies headache or neck pain Ears: Denies changes in hearing, ringing in ears, earache, drainage Nose: Denies discharge, stuffiness, itching, nosebleed, sinus pain Throat: Denies sore throat, hoarseness, dry mouth, sores, thrush Eyes: Denies loss/changes in vision,  pain, redness, blurry/double vision, flashing lights Cardiovascular: Denies chest pain/discomfort, tightness, palpitations, shortness of breath with activity, difficulty lying down, swelling, sudden awakening with shortness of breath Respiratory: Denies shortness of breath, cough, sputum production, wheezing Gastrointestinal: Denies dysphasia, heartburn, change in appetite, nausea, change in bowel habits, rectal bleeding, constipation, diarrhea, yellow skin or eyes Genitourinary: Denies frequency, urgency, burning/pain, blood in urine, incontinence, change in  urinary strength. Musculoskeletal: Denies muscle/joint pain, stiffness, back pain, redness or swelling of joints, trauma Skin: Denies rashes, lumps, itching, dryness, color changes, or hair/nail changes Neurological: Denies dizziness, fainting, seizures, weakness, numbness, tingling, tremor Psychiatric - Denies nervousness, stress, depression or memory loss Endocrine: Denies heat or cold intolerance, sweating, frequent urination, excessive thirst, changes in appetite Hematologic: Denies ease of bruising or bleeding    Objective:     BP 120/70 (BP Location: Left Arm, Patient Position: Sitting, Cuff Size: Large)   Pulse 74   Temp 98.5 F (36.9 C) (Oral)   Resp 16   Ht 5\' 8"  (1.727 m)   Wt 181 lb 6.4 oz (82.3 kg)   SpO2 94%   BMI 27.58 kg/m  Nursing note and vital signs reviewed.  Physical Exam  Constitutional: He is oriented to person, place, and time. He appears well-developed and well-nourished. No distress.  Cardiovascular: Normal rate, regular rhythm, normal heart sounds and intact distal pulses.   Pulmonary/Chest: Effort normal and breath sounds normal.  Neurological: He is alert and oriented to person, place, and time.  Skin: Skin is warm and dry.  Psychiatric: He has a normal mood and affect. His behavior is normal. Judgment and thought content normal.       Assessment & Plan:   During the course of the visit the  patient was educated and counseled about appropriate screening and preventive services including:    Pneumococcal vaccine   Influenza vaccine  Prostate cancer screening  Colorectal cancer screening  Diabetes screening  Glaucoma screening  Nutrition counseling   Diet review for nutrition referral? Yes ____  Not Indicated _X___   Patient Instructions (the written plan) was given to the patient.  Medicare Attestation I have personally reviewed: The patient's medical and social history Their use of alcohol, tobacco or illicit drugs Their current medications and supplements The patient's functional ability including ADLs,fall risks, home safety risks, cognitive, and hearing and visual impairment Diet and physical activities Evidence for depression or mood disorders  The patient's weight, height, BMI,  have been recorded in the chart.  I have made referrals, counseling, and provided education to the patient based on review of the above and I have provided the patient with a written personalized care plan for preventive services.     Problem List Items Addressed This Visit      Cardiovascular and Mediastinum   Essential hypertension    Blood pressure well controlled with current medication regimen and no adverse side effects. Discontinue amlodipine 1 month. Continue to monitor blood pressures at home and follow low-sodium diet.      Relevant Orders   CBC (Completed)   Comprehensive metabolic panel (Completed)     Endocrine   Type 2 diabetes mellitus, controlled ([place])    Previous A1c well-controlled and not currently maintained on medication. Obtain hemoglobin A1c. Diabetic foot exam completed today and normal. Pneumovax is up-to-date per recommendations. Not currently maintained on Ace/ARB for CAD risk reduction. Diabetic eye exam is up-to-date per recommendations. Continue lifestyle management pending A1c results.      Relevant Orders   Hemoglobin A1c (Completed)      Other   HYPERLIPIDEMIA    Currently maintained with lifestyle and no cardiac symptoms. Obtain lipid profile. Continue lifestyle management pending lipid profile results.      Relevant Orders   Lipid panel (Completed)   Medicare annual wellness visit, subsequent - Primary    Reviewed and updated patient's medical, surgical, family and social history.  Medications and allergies were also reviewed. Basic screenings for depression, activities of daily living, hearing, cognition and safety were performed. Provider list was updated and health plan was provided to the patient.   Overall well exam with risk factors for cardiovascular disease including hyperlipidemia, type 2 diabetes, and hypertension. Chronic conditions appear adequately controlled with current lifestyle management and medication regimens. PSA remains checked by urology. He exercises on a regular basis and is eating a moderate, balance, and varied nutritional intake. Continue healthy lifestyle behaviors and choices. Follow-up prevention exam in 1 year. Follow-up office visit pending blood work.            I have discontinued Mr. Longenecker hydrALAZINE, predniSONE, and amLODipine.   Follow-up: Return in about 6 months (around 04/06/2017), or if symptoms worsen or fail to improve.   Mauricio Po, FNP

## 2016-10-06 MED ORDER — ROSUVASTATIN CALCIUM 10 MG PO TABS: 10.0000 mg | ORAL_TABLET | Freq: Every day | ORAL | 2 refills | Status: DC

## 2016-12-07 DIAGNOSIS — R972 Elevated prostate specific antigen [PSA]: Secondary | ICD-10-CM | POA: Diagnosis not present

## 2017-01-13 ENCOUNTER — Telehealth: Payer: Self-pay | Admitting: Emergency Medicine

## 2017-01-13 ENCOUNTER — Ambulatory Visit (INDEPENDENT_AMBULATORY_CARE_PROVIDER_SITE_OTHER): Payer: PPO

## 2017-01-13 DIAGNOSIS — Z23 Encounter for immunization: Secondary | ICD-10-CM

## 2017-01-13 NOTE — Telephone Encounter (Signed)
Thanks, pt will see Dr Ronnald Ramp

## 2017-01-13 NOTE — Telephone Encounter (Signed)
yes

## 2017-01-13 NOTE — Telephone Encounter (Signed)
PT was seen for Flu shot today. Pt would like to transfer to Dr Jenny Reichmann or Dr Ronnald Ramp, he is a former Marya Amsler pt. Please advise if you are okay with taking patient.

## 2017-01-15 NOTE — Telephone Encounter (Signed)
Patient scheduled.

## 2017-01-15 NOTE — Telephone Encounter (Signed)
Please schedule appt with Dr Ronnald Ramp to est

## 2017-04-04 ENCOUNTER — Encounter: Payer: Self-pay | Admitting: Internal Medicine

## 2017-04-04 ENCOUNTER — Ambulatory Visit (INDEPENDENT_AMBULATORY_CARE_PROVIDER_SITE_OTHER): Payer: PPO | Admitting: Internal Medicine

## 2017-04-04 ENCOUNTER — Other Ambulatory Visit (INDEPENDENT_AMBULATORY_CARE_PROVIDER_SITE_OTHER): Payer: PPO

## 2017-04-04 VITALS — BP 180/108 | HR 65 | Temp 98.4°F | Resp 16 | Ht 68.0 in | Wt 184.1 lb

## 2017-04-04 DIAGNOSIS — E785 Hyperlipidemia, unspecified: Secondary | ICD-10-CM

## 2017-04-04 DIAGNOSIS — E1121 Type 2 diabetes mellitus with diabetic nephropathy: Secondary | ICD-10-CM

## 2017-04-04 DIAGNOSIS — I10 Essential (primary) hypertension: Secondary | ICD-10-CM | POA: Diagnosis not present

## 2017-04-04 DIAGNOSIS — R9431 Abnormal electrocardiogram [ECG] [EKG]: Secondary | ICD-10-CM

## 2017-04-04 DIAGNOSIS — R972 Elevated prostate specific antigen [PSA]: Secondary | ICD-10-CM

## 2017-04-04 DIAGNOSIS — R0683 Snoring: Secondary | ICD-10-CM | POA: Diagnosis not present

## 2017-04-04 LAB — URINALYSIS, ROUTINE W REFLEX MICROSCOPIC
Bilirubin Urine: NEGATIVE
Hgb urine dipstick: NEGATIVE
Ketones, ur: NEGATIVE
Leukocytes, UA: NEGATIVE
Nitrite: NEGATIVE
PH: 7 (ref 5.0–8.0)
RBC / HPF: NONE SEEN (ref 0–?)
Specific Gravity, Urine: 1.02 (ref 1.000–1.030)
Total Protein, Urine: NEGATIVE
Urine Glucose: NEGATIVE
Urobilinogen, UA: 0.2 (ref 0.0–1.0)
WBC UA: NONE SEEN (ref 0–?)

## 2017-04-04 LAB — COMPREHENSIVE METABOLIC PANEL
ALBUMIN: 4.6 g/dL (ref 3.5–5.2)
ALT: 22 U/L (ref 0–53)
AST: 21 U/L (ref 0–37)
Alkaline Phosphatase: 80 U/L (ref 39–117)
BILIRUBIN TOTAL: 0.9 mg/dL (ref 0.2–1.2)
BUN: 17 mg/dL (ref 6–23)
CALCIUM: 9.5 mg/dL (ref 8.4–10.5)
CO2: 29 meq/L (ref 19–32)
CREATININE: 1.06 mg/dL (ref 0.40–1.50)
Chloride: 106 mEq/L (ref 96–112)
GFR: 72.78 mL/min (ref >60.00)
Glucose, Bld: 145 mg/dL — ABNORMAL HIGH (ref 70–99)
Potassium: 4.7 mEq/L (ref 3.5–5.1)
Sodium: 143 mEq/L (ref 135–145)
Total Protein: 7 g/dL (ref 6.0–8.3)

## 2017-04-04 LAB — CBC WITH DIFFERENTIAL/PLATELET
BASOS ABS: 0.1 10*3/uL (ref 0.0–0.1)
Basophils Relative: 0.8 % (ref 0.0–3.0)
Eosinophils Absolute: 0.2 10*3/uL (ref 0.0–0.7)
Eosinophils Relative: 3.5 % (ref 0.0–5.0)
HEMATOCRIT: 48.6 % (ref 39.0–52.0)
Hemoglobin: 16.4 g/dL (ref 13.0–17.0)
LYMPHS ABS: 1.7 10*3/uL (ref 0.7–4.0)
LYMPHS PCT: 23.1 % (ref 12.0–46.0)
MCHC: 33.8 g/dL (ref 30.0–36.0)
MCV: 89.7 fl (ref 78.0–100.0)
Monocytes Absolute: 0.6 10*3/uL (ref 0.1–1.0)
Monocytes Relative: 8.9 % (ref 3.0–12.0)
NEUTROS ABS: 4.6 10*3/uL (ref 1.4–7.7)
NEUTROS PCT: 63.7 % (ref 43.0–77.0)
PLATELETS: 162 10*3/uL (ref 150.0–400.0)
RBC: 5.42 Mil/uL (ref 4.22–5.81)
RDW: 13.5 % (ref 11.5–15.5)
WBC: 7.2 10*3/uL (ref 4.0–10.5)

## 2017-04-04 LAB — MICROALBUMIN / CREATININE URINE RATIO
CREATININE, U: 143.4 mg/dL
MICROALB/CREAT RATIO: 0.5 mg/g (ref 0.0–30.0)
Microalb, Ur: <0.7 mg/dL (ref 0.0–1.9)

## 2017-04-04 LAB — LIPID PANEL
CHOL/HDL RATIO: 6
CHOLESTEROL: 246 mg/dL — AB (ref 0–200)
HDL: 37.9 mg/dL — ABNORMAL LOW (ref >39.00)
LDL Cholesterol: 171 mg/dL — ABNORMAL HIGH (ref 0–99)
NonHDL: 208.05
TRIGLYCERIDES: 185 mg/dL — AB (ref 0.0–149.0)
VLDL: 37 mg/dL (ref 0.0–40.0)

## 2017-04-04 LAB — PSA: PSA: 5.08 ng/mL — ABNORMAL HIGH (ref 0.10–4.00)

## 2017-04-04 LAB — TROPONIN I: TNIDX: 0 ug/l (ref 0.00–0.06)

## 2017-04-04 LAB — POCT GLYCOSYLATED HEMOGLOBIN (HGB A1C): Hemoglobin A1C: 6.8

## 2017-04-04 MED ORDER — ASPIRIN EC 81 MG PO TBEC: 81.0000 mg | DELAYED_RELEASE_TABLET | Freq: Every day | ORAL | 3 refills | Status: AC

## 2017-04-04 MED ORDER — CHLORTHALIDONE 25 MG PO TABS: 25.0000 mg | ORAL_TABLET | Freq: Every day | ORAL | 0 refills | Status: DC

## 2017-04-04 MED ORDER — ROSUVASTATIN CALCIUM 20 MG PO TABS: 20.0000 mg | ORAL_TABLET | Freq: Every day | ORAL | 1 refills | Status: DC

## 2017-04-04 MED ORDER — CANAGLIFLOZIN 100 MG PO TABS: 100.0000 mg | ORAL_TABLET | Freq: Every day | ORAL | 0 refills | Status: DC

## 2017-04-04 NOTE — Patient Instructions (Signed)

## 2017-04-04 NOTE — Progress Notes (Signed)
Subjective:  Patient ID: Jerry Palmer, male    DOB: Jan 21, 1946  Age: 72 y.o. MRN: [id number]  CC: Hypertension; Hyperlipidemia; and Diabetes  NEW TO ME  HPI Jerry Palmer presents for establishing a primary care relationship.  He has an history of an elevated PSA but tells me he had a prostate biopsy done 4 months ago that was negative for cancer.  He has mild BPH symptoms.  He has a history of hypertension but is not currently taking an antihypertensive.  He is very active and denies any recent episodes of CP, DOE, palpitations, edema, or fatigue.  He has had severe allergic reactions to ACEs and ARBs.  He complains of recent weight gain.  He was previously prescribed a statin but is stopped taking it.  Did not cause any side effects.  Outpatient Medications Prior to Visit  Medication Sig Dispense Refill  . rosuvastatin (CRESTOR) 10 MG tablet Take 1 tablet (10 mg total) by mouth daily. (Patient not taking: Reported on 04/04/2017) 30 tablet 2   No facility-administered medications prior to visit.     ROS Review of Systems  Constitutional: Positive for unexpected weight change. Negative for activity change, diaphoresis and fatigue.  HENT: Negative.   Respiratory: Positive for apnea. Negative for chest tightness, shortness of breath and wheezing.        His wife complains about his heavy snoring and she sleeps in a separate room.  Cardiovascular: Negative for chest pain, palpitations and leg swelling.  Gastrointestinal: Negative for abdominal pain, constipation, diarrhea, nausea and vomiting.  Endocrine: Negative for polydipsia, polyphagia and polyuria.  Genitourinary: Negative.  Negative for difficulty urinating, dysuria and hematuria.  Musculoskeletal: Negative.  Negative for arthralgias and myalgias.  Skin: Negative.  Negative for rash.  Neurological: Negative.  Negative for dizziness, weakness and light-headedness.  Hematological: Negative for adenopathy. Does not  bruise/bleed easily.  Psychiatric/Behavioral: Negative.     Objective:  BP (!) 180/108 (BP Location: Left Arm, Patient Position: Sitting, Cuff Size: Normal)   Pulse 65   Temp 98.4 F (36.9 C) (Oral)   Resp 16   Ht 5\' 8"  (1.727 m)   Wt 184 lb 2 oz (83.5 kg)   SpO2 96%   BMI 28.00 kg/m   BP Readings from Last 3 Encounters:  04/04/17 (!) 180/108  10/04/16 120/70  01/11/16 (!) 154/94    Wt Readings from Last 3 Encounters:  04/04/17 184 lb 2 oz (83.5 kg)  10/04/16 181 lb 6.4 oz (82.3 kg)  01/11/16 182 lb (82.6 kg)    Physical Exam  Constitutional: He is oriented to person, place, and time. No distress.  HENT:  Mouth/Throat: Oropharynx is clear and moist. No oropharyngeal exudate.  Eyes: Conjunctivae are normal. Left eye exhibits no discharge. No scleral icterus.  Neck: Normal range of motion. Neck supple. No JVD present. No thyromegaly present.  Cardiovascular: Normal rate, regular rhythm and normal heart sounds. Exam reveals no gallop.  No murmur heard. EKG--  Sinus  Rhythm  -With rate variation  cv = 20. -  Nonspecific T-abnormality.   ABNORMAL - no old EKG for comparison   Pulmonary/Chest: Effort normal and breath sounds normal. No respiratory distress. He has no wheezes. He has no rales.  Abdominal: Soft. Bowel sounds are normal. He exhibits no mass. There is no tenderness. There is no guarding.  Musculoskeletal: Normal range of motion. He exhibits no edema or tenderness.  Lymphadenopathy:    He has no cervical adenopathy.  Neurological: He  is alert and oriented to person, place, and time.  Skin: Skin is warm and dry. No rash noted. He is not diaphoretic. No erythema. No pallor.  Psychiatric: He has a normal mood and affect. His behavior is normal. Judgment and thought content normal.  Vitals reviewed.   Lab Results  Component Value Date   WBC 7.2 04/04/2017   HGB 16.4 04/04/2017   HCT 48.6 04/04/2017   PLT 162.0 04/04/2017   GLUCOSE 145 (H) 04/04/2017    CHOL 246 (H) 04/04/2017   TRIG 185.0 (H) 04/04/2017   HDL 37.90 (L) 04/04/2017   LDLDIRECT 157.4 12/30/2009   LDLCALC 171 (H) 04/04/2017   ALT 22 04/04/2017   AST 21 04/04/2017   NA 143 04/04/2017   K 4.7 04/04/2017   CL 106 04/04/2017   CREATININE 1.06 04/04/2017   BUN 17 04/04/2017   CO2 29 04/04/2017   TSH 3.15 05/05/2014   PSA 5.08 (H) 04/04/2017   HGBA1C 6.8 04/04/2017   MICROALBUR <0.7 04/04/2017    No results found.  Assessment & Plan:   Jerry Palmer was seen today for hypertension, hyperlipidemia and diabetes.  Diagnoses and all orders for this visit:  Controlled type 2 diabetes mellitus with diabetic nephropathy, without long-term current use of insulin ([place])- his A1c is at 6.8%.  Will start an SGL T-2 inhibitor for blood sugar control. -     Comprehensive metabolic panel; Future -     Microalbumin / creatinine urine ratio; Future -     POCT glycosylated hemoglobin (Hb A1C) -     canagliflozin (INVOKANA) 100 MG TABS tablet; Take 1 tablet (100 mg total) by mouth daily before breakfast.  Essential hypertension- His blood pressure is not well controlled.  He has contraindications to ACE is an ARB's.  Will treat this with a thiazide diuretic.  We will also screen him for sleep apnea. -     CBC with Differential/Platelet; Future -     Comprehensive metabolic panel; Future -     Urinalysis, Routine w reflex microscopic; Future -     EKG 12-Lead -     chlorthalidone (HYGROTON) 25 MG tablet; Take 1 tablet (25 mg total) by mouth daily.  Dyslipidemia, goal LDL below 70- He has not achieved his LDL goal due to noncompliance.  I have asked him to restart a statin. -     Lipid panel; Future -     rosuvastatin (CRESTOR) 20 MG tablet; Take 1 tablet (20 mg total) by mouth daily. -     aspirin EC 81 MG tablet; Take 1 tablet (81 mg total) by mouth daily.  NONSPECIFIC ABNORMAL ELECTROCARDIOGRAM- His EKG shows nonspecific T wave abnormality and he has no symptoms of ischemia or CHF.   Troponin is normal.  Will address risk factors for cardiovascular events. -     Troponin I; Future  Elevated PSA, between 10 and less than 20 ng/ml- His PSA has come down significantly which is reassuring that he does not have prostate cancer.  I will continue to follow this. -     PSA; Future  Snoring -     Ambulatory referral to Sleep Studies   I have discontinued Jori Moll L. Ridgely's rosuvastatin. I am also having him start on chlorthalidone, canagliflozin, rosuvastatin, and aspirin EC.  Meds ordered this encounter  Medications  . chlorthalidone (HYGROTON) 25 MG tablet    Sig: Take 1 tablet (25 mg total) by mouth daily.    Dispense:  90 tablet  Refill:  0  . canagliflozin (INVOKANA) 100 MG TABS tablet    Sig: Take 1 tablet (100 mg total) by mouth daily before breakfast.    Dispense:  90 tablet    Refill:  0  . rosuvastatin (CRESTOR) 20 MG tablet    Sig: Take 1 tablet (20 mg total) by mouth daily.    Dispense:  90 tablet    Refill:  1  . aspirin EC 81 MG tablet    Sig: Take 1 tablet (81 mg total) by mouth daily.    Dispense:  90 tablet    Refill:  3     Follow-up: Return in about 6 weeks (around 05/16/2017).  Scarlette Calico, MD

## 2017-04-05 ENCOUNTER — Encounter: Payer: Self-pay | Admitting: Internal Medicine

## 2017-04-10 ENCOUNTER — Telehealth: Payer: Self-pay | Admitting: Emergency Medicine

## 2017-04-10 NOTE — Telephone Encounter (Signed)
While speaking to pts wife, she requested that pts lab results be mail to him. Please advise, I do not see that the labs are resulted.

## 2017-04-12 NOTE — Telephone Encounter (Signed)
Results have already been mailed.

## 2017-05-02 ENCOUNTER — Institutional Professional Consult (permissible substitution): Payer: Self-pay | Admitting: Neurology

## 2017-05-16 ENCOUNTER — Other Ambulatory Visit (INDEPENDENT_AMBULATORY_CARE_PROVIDER_SITE_OTHER): Payer: PPO

## 2017-05-16 ENCOUNTER — Ambulatory Visit (INDEPENDENT_AMBULATORY_CARE_PROVIDER_SITE_OTHER): Payer: PPO | Admitting: Internal Medicine

## 2017-05-16 ENCOUNTER — Encounter: Payer: Self-pay | Admitting: Internal Medicine

## 2017-05-16 VITALS — BP 132/78 | HR 80 | Temp 98.7°F | Resp 16 | Wt 175.0 lb

## 2017-05-16 DIAGNOSIS — E118 Type 2 diabetes mellitus with unspecified complications: Secondary | ICD-10-CM

## 2017-05-16 DIAGNOSIS — I10 Essential (primary) hypertension: Secondary | ICD-10-CM

## 2017-05-16 DIAGNOSIS — K5900 Constipation, unspecified: Secondary | ICD-10-CM | POA: Diagnosis not present

## 2017-05-16 DIAGNOSIS — Z23 Encounter for immunization: Secondary | ICD-10-CM

## 2017-05-16 LAB — BASIC METABOLIC PANEL
BUN: 22 mg/dL (ref 6–23)
CALCIUM: 9.9 mg/dL (ref 8.4–10.5)
CO2: 29 mEq/L (ref 19–32)
CREATININE: 1.24 mg/dL (ref 0.40–1.50)
Chloride: 98 mEq/L (ref 96–112)
GFR: 60.71 mL/min (ref >60.00)
Glucose, Bld: 146 mg/dL — ABNORMAL HIGH (ref 70–99)
POTASSIUM: 3.5 meq/L (ref 3.5–5.1)
Sodium: 138 mEq/L (ref 135–145)

## 2017-05-16 LAB — THYROID PANEL WITH TSH
FREE THYROXINE INDEX: 2.6 (ref 1.4–3.8)
T3 UPTAKE: 29 % (ref 22–35)
T4 TOTAL: 8.9 ug/dL (ref 4.9–10.5)
TSH: 2.75 mIU/L (ref 0.40–4.50)

## 2017-05-16 MED ORDER — LINACLOTIDE 145 MCG PO CAPS: 145.0000 ug | ORAL_CAPSULE | Freq: Every day | ORAL | 1 refills | Status: DC

## 2017-05-16 NOTE — Progress Notes (Signed)
Subjective:  Patient ID: Jerry Palmer, male    DOB: 17-Feb-1946  Age: 72 y.o. MRN: [id number]  CC: Hypertension; Diabetes; and Constipation   HPI Jerry Palmer presents for f/up - He complains of chronic, worsening constipation over many years.  He says it has gotten so severe recently that he is having a bowel movement about once a week and has intermittent abdominal bloating.  He has tried over-the-counter remedies such as MiraLAX and fiber without any improvement.  He otherwise feels well and offers no complaints.  He tells me his blood pressure and blood sugar have been well controlled since I last saw him.  Outpatient Medications Prior to Visit  Medication Sig Dispense Refill  . aspirin EC 81 MG tablet Take 1 tablet (81 mg total) by mouth daily. 90 tablet 3  . canagliflozin (INVOKANA) 100 MG TABS tablet Take 1 tablet (100 mg total) by mouth daily before breakfast. 90 tablet 0  . chlorthalidone (HYGROTON) 25 MG tablet Take 1 tablet (25 mg total) by mouth daily. 90 tablet 0  . rosuvastatin (CRESTOR) 20 MG tablet Take 1 tablet (20 mg total) by mouth daily. 90 tablet 1   No facility-administered medications prior to visit.     ROS Review of Systems  Constitutional: Negative.  Negative for appetite change, diaphoresis and fatigue.  HENT: Negative.  Negative for sinus pressure and sore throat.   Eyes: Negative.   Respiratory: Negative.  Negative for cough, chest tightness, shortness of breath and wheezing.   Cardiovascular: Negative for chest pain, palpitations and leg swelling.  Gastrointestinal: Positive for abdominal distention and constipation. Negative for abdominal pain, diarrhea, nausea and vomiting.  Endocrine: Negative.  Negative for polydipsia, polyphagia and polyuria.  Genitourinary: Negative.  Negative for difficulty urinating.  Musculoskeletal: Negative.  Negative for back pain.  Skin: Negative.  Negative for color change.  Neurological: Negative for dizziness,  weakness and light-headedness.  Hematological: Negative for adenopathy. Does not bruise/bleed easily.  Psychiatric/Behavioral: Negative.     Objective:  BP 132/78 (BP Location: Left Arm, Patient Position: Sitting, Cuff Size: Normal)   Pulse 80   Temp 98.7 F (37.1 C) (Oral)   Resp 16   Wt 175 lb (79.4 kg)   SpO2 98%   BMI 26.61 kg/m   BP Readings from Last 3 Encounters:  05/16/17 132/78  04/04/17 (!) 180/108  10/04/16 120/70    Wt Readings from Last 3 Encounters:  05/16/17 175 lb (79.4 kg)  04/04/17 184 lb 2 oz (83.5 kg)  10/04/16 181 lb 6.4 oz (82.3 kg)    Physical Exam  Constitutional: He is oriented to person, place, and time.  Non-toxic appearance. He does not have a sickly appearance. He does not appear ill. No distress.  HENT:  Mouth/Throat: Oropharynx is clear and moist. No oropharyngeal exudate.  Eyes: Conjunctivae are normal. Left eye exhibits no discharge. No scleral icterus.  Neck: Normal range of motion. Neck supple. No JVD present. No thyromegaly present.  Cardiovascular: Normal rate, regular rhythm and normal heart sounds. Exam reveals no gallop.  No murmur heard. Pulmonary/Chest: Effort normal and breath sounds normal. No respiratory distress. He has no wheezes. He has no rales.  Abdominal: Soft. Normal appearance. He exhibits no mass. Bowel sounds are decreased. There is no hepatosplenomegaly, splenomegaly or hepatomegaly. There is no tenderness. There is no rebound, no guarding and no CVA tenderness.  Musculoskeletal: Normal range of motion. He exhibits no edema or tenderness.  Lymphadenopathy:    He has  no cervical adenopathy.  Neurological: He is alert and oriented to person, place, and time.  Skin: Skin is warm and dry. No rash noted. He is not diaphoretic. No erythema. No pallor.  Vitals reviewed.   Lab Results  Component Value Date   WBC 7.2 04/04/2017   HGB 16.4 04/04/2017   HCT 48.6 04/04/2017   PLT 162.0 04/04/2017   GLUCOSE 146 (H)  05/16/2017   CHOL 246 (H) 04/04/2017   TRIG 185.0 (H) 04/04/2017   HDL 37.90 (L) 04/04/2017   LDLDIRECT 157.4 12/30/2009   LDLCALC 171 (H) 04/04/2017   ALT 22 04/04/2017   AST 21 04/04/2017   NA 138 05/16/2017   K 3.5 05/16/2017   CL 98 05/16/2017   CREATININE 1.24 05/16/2017   BUN 22 05/16/2017   CO2 29 05/16/2017   TSH 2.75 05/16/2017   PSA 5.08 (H) 04/04/2017   HGBA1C 6.8 04/04/2017   MICROALBUR <0.7 04/04/2017    No results found.  Assessment & Plan:   Jerry Palmer was seen today for hypertension, diabetes and constipation.  Diagnoses and all orders for this visit:  Constipation, unspecified constipation type- His lab work is negative for any secondary or organic causes.  Will treat with Linzess. -     Basic metabolic panel; Future -     Thyroid Panel With TSH; Future -     linaclotide (LINZESS) 145 MCG CAPS capsule; Take 1 capsule (145 mcg total) by mouth daily before breakfast.  Essential hypertension- His blood pressure is well controlled.  Electrolytes and renal function are normal. -     Basic metabolic panel; Future -     Thyroid Panel With TSH; Future  Type 2 diabetes mellitus with complication, without long-term current use of insulin ([place])- His blood sugars are adequately well controlled.  Will continue the SGLT-2 and 2 inhibitor. -     Basic metabolic panel; Future  Need for pneumococcal vaccination -     Pneumococcal polysaccharide vaccine 23-valent greater than or equal to 2yo subcutaneous/IM   I am having Jerry Palmer start on linaclotide. I am also having him maintain his chlorthalidone, canagliflozin, rosuvastatin, and aspirin EC.  Meds ordered this encounter  Medications  . linaclotide (LINZESS) 145 MCG CAPS capsule    Sig: Take 1 capsule (145 mcg total) by mouth daily before breakfast.    Dispense:  90 capsule    Refill:  1     Follow-up: Return in about 4 months (around 09/13/2017).  Scarlette Calico, MD

## 2017-05-16 NOTE — Patient Instructions (Signed)

## 2017-06-12 DIAGNOSIS — R972 Elevated prostate specific antigen [PSA]: Secondary | ICD-10-CM | POA: Diagnosis not present

## 2017-06-12 LAB — PSA: PSA: 14.8

## 2017-06-19 DIAGNOSIS — R972 Elevated prostate specific antigen [PSA]: Secondary | ICD-10-CM | POA: Diagnosis not present

## 2017-06-19 DIAGNOSIS — N5201 Erectile dysfunction due to arterial insufficiency: Secondary | ICD-10-CM | POA: Diagnosis not present

## 2017-07-17 ENCOUNTER — Telehealth: Payer: Self-pay

## 2017-07-17 DIAGNOSIS — I10 Essential (primary) hypertension: Secondary | ICD-10-CM

## 2017-07-17 MED ORDER — CHLORTHALIDONE 25 MG PO TABS: 25.0000 mg | ORAL_TABLET | Freq: Every day | ORAL | 0 refills | Status: DC

## 2017-07-17 NOTE — Telephone Encounter (Signed)
[facility] sent rx rq for the chlorthalidone 25mg  tablet.  LOV 04/2017. Next appt sched for 09/2017.   90 day sent to pharmacy as rq'ed.

## 2017-07-24 DIAGNOSIS — R972 Elevated prostate specific antigen [PSA]: Secondary | ICD-10-CM | POA: Diagnosis not present

## 2017-10-05 ENCOUNTER — Telehealth: Payer: Self-pay | Admitting: Internal Medicine

## 2017-10-05 DIAGNOSIS — E785 Hyperlipidemia, unspecified: Secondary | ICD-10-CM

## 2017-10-05 DIAGNOSIS — I10 Essential (primary) hypertension: Secondary | ICD-10-CM

## 2017-10-05 MED ORDER — CHLORTHALIDONE 25 MG PO TABS: 25.0000 mg | ORAL_TABLET | Freq: Every day | ORAL | 0 refills | Status: DC

## 2017-10-05 MED ORDER — ROSUVASTATIN CALCIUM 20 MG PO TABS: 20.0000 mg | ORAL_TABLET | Freq: Every day | ORAL | 1 refills | Status: DC

## 2017-10-05 NOTE — Telephone Encounter (Signed)
Copied from [place] [id number]. Topic: Quick Communication - Rx Refill/Question >> Oct 05, 2017  2:33 PM Margot Ables wrote: Medication: rosuvastatin & Chlorthalidone - pt is out of both - Pt requested on Monday from pharmacy and they state they have sent 2 requests with no response. Please advise. Has the patient contacted their pharmacy? yes Preferred Pharmacy (with phone number or street name): [facility] [place] [address] [place] [address] Phone: [phone] Fax: [phone]

## 2017-10-10 ENCOUNTER — Other Ambulatory Visit (INDEPENDENT_AMBULATORY_CARE_PROVIDER_SITE_OTHER): Payer: PPO

## 2017-10-10 ENCOUNTER — Ambulatory Visit (INDEPENDENT_AMBULATORY_CARE_PROVIDER_SITE_OTHER): Payer: PPO | Admitting: Internal Medicine

## 2017-10-10 ENCOUNTER — Encounter: Payer: Self-pay | Admitting: Internal Medicine

## 2017-10-10 ENCOUNTER — Telehealth: Payer: Self-pay

## 2017-10-10 VITALS — BP 122/86 | HR 68 | Temp 98.2°F | Ht 68.0 in | Wt 169.0 lb

## 2017-10-10 DIAGNOSIS — I491 Atrial premature depolarization: Secondary | ICD-10-CM

## 2017-10-10 DIAGNOSIS — E118 Type 2 diabetes mellitus with unspecified complications: Secondary | ICD-10-CM | POA: Diagnosis not present

## 2017-10-10 DIAGNOSIS — I10 Essential (primary) hypertension: Secondary | ICD-10-CM | POA: Diagnosis not present

## 2017-10-10 DIAGNOSIS — E785 Hyperlipidemia, unspecified: Secondary | ICD-10-CM

## 2017-10-10 LAB — BASIC METABOLIC PANEL
BUN: 21 mg/dL (ref 6–23)
CALCIUM: 9.9 mg/dL (ref 8.4–10.5)
CO2: 32 mEq/L (ref 19–32)
Chloride: 99 mEq/L (ref 96–112)
Creatinine, Ser: 1.24 mg/dL (ref 0.40–1.50)
GFR: 60.64 mL/min (ref >60.00)
Glucose, Bld: 225 mg/dL — ABNORMAL HIGH (ref 70–99)
POTASSIUM: 3.5 meq/L (ref 3.5–5.1)
SODIUM: 139 meq/L (ref 135–145)

## 2017-10-10 LAB — LIPID PANEL
CHOL/HDL RATIO: 4
Cholesterol: 138 mg/dL (ref 0–200)
HDL: 37.8 mg/dL — ABNORMAL LOW (ref >39.00)
LDL Cholesterol: 66 mg/dL (ref 0–99)
NonHDL: 100.25
TRIGLYCERIDES: 171 mg/dL — AB (ref 0.0–149.0)
VLDL: 34.2 mg/dL (ref 0.0–40.0)

## 2017-10-10 LAB — HM DIABETES FOOT EXAM

## 2017-10-10 LAB — HEMOGLOBIN A1C: Hgb A1c MFr Bld: 8.8 % — ABNORMAL HIGH (ref 4.6–6.5)

## 2017-10-10 LAB — TSH: TSH: 2.72 u[IU]/mL (ref 0.35–4.50)

## 2017-10-10 MED ORDER — EMPAGLIFLOZIN-METFORMIN HCL 5-1000 MG PO TABS: 1.0000 | ORAL_TABLET | Freq: Two times a day (BID) | ORAL | 1 refills | Status: DC

## 2017-10-10 NOTE — Telephone Encounter (Signed)
Pt ans wife were on Speakerphone ans expressed understanding.

## 2017-10-10 NOTE — Patient Instructions (Signed)

## 2017-10-10 NOTE — Telephone Encounter (Signed)
-----   Message from Janith Lima, MD sent at 10/10/2017 12:57 PM EDT ----- pls let him know that his blood sugars are too high His A1C is up to 8.8%  Ask him to stop taking INVOKANA I have sent an RX to his pharmacy for him to change to [facility]  His other labs were ok

## 2017-10-10 NOTE — Progress Notes (Signed)
Subjective:  Patient ID: Jerry Palmer, male    DOB: Apr 10, 1945  Age: 72 y.o. MRN: [id number]  CC: Hypertension; Diabetes; and Hyperlipidemia   HPI Jerry Palmer presents for f/up -over the last few months he is developed fatigue and DOE.  He denies any episodes of CP, palpitations, diaphoresis, dizziness, lightheadedness, or near syncope.  He is not taking the SGLT2 inhibitor that was previously prescribed.  He does not monitor his blood sugars and he denies polys.  Outpatient Medications Prior to Visit  Medication Sig Dispense Refill  . aspirin EC 81 MG tablet Take 1 tablet (81 mg total) by mouth daily. 90 tablet 3  . chlorthalidone (HYGROTON) 25 MG tablet Take 1 tablet (25 mg total) by mouth daily. 90 tablet 0  . rosuvastatin (CRESTOR) 20 MG tablet Take 1 tablet (20 mg total) by mouth daily. 90 tablet 1  . canagliflozin (INVOKANA) 100 MG TABS tablet Take 1 tablet (100 mg total) by mouth daily before breakfast. 90 tablet 0  . linaclotide (LINZESS) 145 MCG CAPS capsule Take 1 capsule (145 mcg total) by mouth daily before breakfast. 90 capsule 1   No facility-administered medications prior to visit.     ROS Review of Systems  Constitutional: Positive for fatigue. Negative for diaphoresis.  HENT: Negative.   Eyes: Negative for visual disturbance.  Respiratory: Positive for shortness of breath (DOE). Negative for cough, chest tightness and wheezing.   Cardiovascular: Negative for chest pain, palpitations and leg swelling.  Gastrointestinal: Negative for abdominal pain, constipation, diarrhea, nausea and vomiting.  Endocrine: Negative.  Negative for polydipsia, polyphagia and polyuria.  Genitourinary: Negative for difficulty urinating and dysuria.  Musculoskeletal: Negative.  Negative for arthralgias and myalgias.  Skin: Negative.  Negative for color change and pallor.  Neurological: Negative for dizziness, weakness and light-headedness.  Hematological: Negative for adenopathy.  Does not bruise/bleed easily.  Psychiatric/Behavioral: Negative.     Objective:  BP 122/86   Pulse 68   Temp 98.2 F (36.8 C) (Oral)   Ht 5\' 8"  (1.727 m)   Wt 169 lb (76.7 kg)   BMI 25.70 kg/m   BP Readings from Last 3 Encounters:  10/10/17 122/86  05/16/17 132/78  04/04/17 (!) 180/108    Wt Readings from Last 3 Encounters:  10/10/17 169 lb (76.7 kg)  05/16/17 175 lb (79.4 kg)  04/04/17 184 lb 2 oz (83.5 kg)    Physical Exam  Constitutional: He is oriented to person, place, and time. No distress.  HENT:  Mouth/Throat: Oropharynx is clear and moist. No oropharyngeal exudate.  Eyes: Conjunctivae are normal. No scleral icterus.  Neck: Normal range of motion. Neck supple. No JVD present. No thyromegaly present.  Cardiovascular: Normal rate and regular rhythm. Frequent extrasystoles are present. Exam reveals no gallop.  No murmur heard. EKG ---  Sinus  Rhythm  -Frequent PACs -atrial bigeminy  -  Diffuse nonspecific T-abnormality.   ABNORMAL -his previous EKG from about 6 months ago showed sinus arrhythmia but the EKG today shows an increase in his PACs and a new development of atrial bigeminy.   Pulmonary/Chest: Effort normal and breath sounds normal. He has no wheezes. He has no rhonchi. He has no rales.  Abdominal: Soft. Bowel sounds are normal. He exhibits no mass. There is no hepatosplenomegaly. There is no tenderness.  Musculoskeletal: Normal range of motion. He exhibits no edema, tenderness or deformity.  Neurological: He is alert and oriented to person, place, and time.  Skin: Skin is warm and  dry. No rash noted. He is not diaphoretic.  Vitals reviewed.   Lab Results  Component Value Date   WBC 7.2 04/04/2017   HGB 16.4 04/04/2017   HCT 48.6 04/04/2017   PLT 162.0 04/04/2017   GLUCOSE 225 (H) 10/10/2017   CHOL 138 10/10/2017   TRIG 171.0 (H) 10/10/2017   HDL 37.80 (L) 10/10/2017   LDLDIRECT 157.4 12/30/2009   LDLCALC 66 10/10/2017   ALT 22 04/04/2017    AST 21 04/04/2017   NA 139 10/10/2017   K 3.5 10/10/2017   CL 99 10/10/2017   CREATININE 1.24 10/10/2017   BUN 21 10/10/2017   CO2 32 10/10/2017   TSH 2.72 10/10/2017   PSA 14.80 06/12/2017   HGBA1C 8.8 (H) 10/10/2017   MICROALBUR <0.7 04/04/2017    No results found.  Assessment & Plan:   Jerry Palmer was seen today for hypertension, diabetes and hyperlipidemia.  Diagnoses and all orders for this visit:  Essential hypertension- His blood pressure is well controlled.  Electrolytes and renal function are normal. -     Basic metabolic panel; Future -     Cancel: EKG 12-Lead -     EKG 12-Lead  Type 2 diabetes mellitus with complication, without long-term current use of insulin ([place])- His A1c is up to 8.8%.  His blood sugars are not adequately well controlled.  I will restart the SGLT2 inhibitor and will add metformin as well. -     Basic metabolic panel; Future -     Hemoglobin A1c; Future -     Ambulatory referral to Ophthalmology -     Empagliflozin-metFORMIN HCl (SYNJARDY) 07-998 MG TABS; Take 1 tablet by mouth 2 (two) times daily.  Dyslipidemia, goal LDL below 70- He has achieved his LDL goal and is doing well on the statin. -     Lipid panel; Future -     TSH; Future  PAC (premature atrial contraction)- His labs are negative for any secondary causes of this.  I am not certain that this is driving his symptoms but I have asked him to see cardiology to consider a more extensive structural and electrical evaluation of his heart. -     Ambulatory referral to Cardiology -     TSH; Future   I have discontinued Jerry Palmer's canagliflozin and linaclotide. I am also having him start on Empagliflozin-metFORMIN HCl. Additionally, I am having him maintain his aspirin EC, rosuvastatin, and chlorthalidone.  Meds ordered this encounter  Medications  . Empagliflozin-metFORMIN HCl (SYNJARDY) 07-998 MG TABS    Sig: Take 1 tablet by mouth 2 (two) times daily.    Dispense:  180 tablet     Refill:  1     Follow-up: Return in about 6 months (around 04/12/2018).  Scarlette Calico, MD

## 2017-10-11 ENCOUNTER — Ambulatory Visit: Payer: PPO | Admitting: Cardiology

## 2017-10-11 ENCOUNTER — Encounter: Payer: Self-pay | Admitting: Cardiology

## 2017-10-11 VITALS — BP 140/80 | HR 91 | Ht 68.0 in | Wt 171.0 lb

## 2017-10-11 DIAGNOSIS — E785 Hyperlipidemia, unspecified: Secondary | ICD-10-CM

## 2017-10-11 DIAGNOSIS — E118 Type 2 diabetes mellitus with unspecified complications: Secondary | ICD-10-CM | POA: Diagnosis not present

## 2017-10-11 DIAGNOSIS — R0609 Other forms of dyspnea: Secondary | ICD-10-CM

## 2017-10-11 DIAGNOSIS — R9431 Abnormal electrocardiogram [ECG] [EKG]: Secondary | ICD-10-CM

## 2017-10-11 DIAGNOSIS — I10 Essential (primary) hypertension: Secondary | ICD-10-CM | POA: Diagnosis not present

## 2017-10-11 NOTE — Progress Notes (Signed)
Cardiology Office Note:    Date:  10/11/2017   ID:  Jerry Palmer, DOB July 01, 1945, MRN [id number]  PCP:  Janith Lima, MD  Cardiologist:  Jenean Lindau, MD   Referring MD: Janith Lima, MD    ASSESSMENT:    1. DOE (dyspnea on exertion)   2. Essential hypertension   3. Type 2 diabetes mellitus with complication, without long-term current use of insulin ([place])   4. Dyslipidemia, goal LDL below 70    PLAN:    In order of problems listed above:  1. Primary prevention stressed with the patient.  Importance of compliance with diet and medications stressed and he vocalized understanding.  His blood pressure is stable.  He checks it at home.  He has an element of whitecoat hypertension. 2. Diet was discussed for dyslipidemia and diabetes mellitus.  His diabetes is not breast controlled and I discussed diet for him he vocalized understanding. 3. In view of his above symptoms and risk factors we will do an exercise stress Cardiolite.  If this is negative I told him to embark on an exercise program. 4. Patient will be seen in follow-up appointment in 2 months or earlier if the patient has any concerns    Medication Adjustments/Labs and Tests Ordered: Current medicines are reviewed at length with the patient today.  Concerns regarding medicines are outlined above.  No orders of the defined types were placed in this encounter.  No orders of the defined types were placed in this encounter.    History of Present Illness:    Jerry Palmer is a 72 y.o. male who is being seen today for the evaluation of generalized weakness and shortness of breath on exertion at the request of Janith Lima, MD.  Patient is a pleasant 72 year old male.  He has past medical history of essential hypertension, dyslipidemia and diabetes mellitus.  He mentions to me that he is feeling tired easily.Marland Kitchen  He also mentions to me that he has some shortness of breath on exertion.  He leads a sedentary  lifestyle.  He is an active gentleman.  No chest pain orthopnea or PND.  At the time of my evaluation, the patient is alert awake oriented and in no distress.  His wife accompanies him for his visit today and she is very supportive.  Past Medical History:  Diagnosis Date  . Cancer (HCC)    Basal Cell  . Diverticulosis of colon   . Fasting hyperglycemia   . Hematuria    PMH of  . Hyperlipidemia   . Hypertension     Past Surgical History:  Procedure Laterality Date  . CATARACT EXTRACTION  04/06/14   right eye  . cataract left eye  1995  . COLONOSCOPY W/ POLYPECTOMY      polyp x 1; negative X 2;Dr Sharlett Iles  . cyst scalp  2005    Current Medications: Current Meds  Medication Sig  . aspirin EC 81 MG tablet Take 1 tablet (81 mg total) by mouth daily.  . chlorthalidone (HYGROTON) 25 MG tablet Take 1 tablet (25 mg total) by mouth daily.  . rosuvastatin (CRESTOR) 20 MG tablet Take 1 tablet (20 mg total) by mouth daily.     Allergies:   Angiotensin receptor blockers; Lisinopril; and Bactrim [sulfamethoxazole-trimethoprim]   Social History   Socioeconomic History  . Marital status: Married    Spouse name: Not on file  . Number of children: 0  . Years of education: 8  .  Highest education level: Not on file  Occupational History  . Not on file  Social Needs  . Financial resource strain: Not on file  . Food insecurity:    Worry: Not on file    Inability: Not on file  . Transportation needs:    Medical: Not on file    Non-medical: Not on file  Tobacco Use  . Smoking status: Former Smoker    Types: Cigarettes    Last attempt to quit: 03/27/1978    Years since quitting: 39.5  . Smokeless tobacco: Never Used  . Tobacco comment: smoked [place], up to 1.5 ppd   Substance and Sexual Activity  . Alcohol use: No    Alcohol/week: 0.0 oz  . Drug use: No  . Sexual activity: Not on file  Lifestyle  . Physical activity:    Days per week: Not on file    Minutes per session: Not  on file  . Stress: Not on file  Relationships  . Social connections:    Talks on phone: Not on file    Gets together: Not on file    Attends religious service: Not on file    Active member of club or organization: Not on file    Attends meetings of clubs or organizations: Not on file    Relationship status: Not on file  Other Topics Concern  . Not on file  Social History Narrative   Fun: Camping      Family History: The patient's family history includes Breast cancer in his mother; Cancer in his brother; Colon cancer (age of onset: 51) in his brother; Colon cancer (age of onset: 25) in his brother and brother; Diabetes in his sister; Hypertension in his mother; Stroke in his maternal grandfather. There is no history of Heart disease.  ROS:   Please see the history of present illness.    All other systems reviewed and are negative.  EKGs/Labs/Other Studies Reviewed:    The following studies were reviewed today: EKG reveals sinus rhythm and nonspecific ST-T changes.   Recent Labs: 04/04/2017: ALT 22; Hemoglobin 16.4; Platelets 162.0 10/10/2017: BUN 21; Creatinine, Ser 1.24; Potassium 3.5; Sodium 139; TSH 2.72  Recent Lipid Panel    Component Value Date/Time   CHOL 138 10/10/2017 0830   CHOL 168 05/05/2014 1029   TRIG 171.0 (H) 10/10/2017 0830   TRIG 89 05/05/2014 1029   TRIG 111 03/14/2006 0943   HDL 37.80 (L) 10/10/2017 0830   HDL 48 05/05/2014 1029   CHOLHDL 4 10/10/2017 0830   VLDL 34.2 10/10/2017 0830   LDLCALC 66 10/10/2017 0830   LDLCALC 102 (H) 05/05/2014 1029   LDLDIRECT 157.4 12/30/2009 0934    Physical Exam:    VS:  BP 140/80 (BP Location: Left Arm, Patient Position: Sitting, Cuff Size: Normal)   Pulse 91   Ht 5\' 8"  (1.727 m)   Wt 171 lb (77.6 kg)   SpO2 96%   BMI 26.00 kg/m     Wt Readings from Last 3 Encounters:  10/11/17 171 lb (77.6 kg)  10/10/17 169 lb (76.7 kg)  05/16/17 175 lb (79.4 kg)     GEN: Patient is in no acute distress HEENT:  Normal NECK: No JVD; No carotid bruits LYMPHATICS: No lymphadenopathy CARDIAC: S1 S2 regular, 2/6 systolic murmur at the apex. RESPIRATORY:  Clear to auscultation without rales, wheezing or rhonchi  ABDOMEN: Soft, non-tender, non-distended MUSCULOSKELETAL:  No edema; No deformity  SKIN: Warm and dry NEUROLOGIC:  Alert and oriented  x 3 PSYCHIATRIC:  Normal affect    Signed, Jenean Lindau, MD  10/11/2017 4:37 PM    [facility]

## 2017-10-11 NOTE — Patient Instructions (Signed)
Medication Instructions:  Your physician recommends that you continue on your current medications as directed. Please refer to the Current Medication list given to you today.  Labwork: None  Testing/Procedures: Your physician has requested that you have en exercise stress myoview. For further information please visit [email or web address]. Please follow instruction sheet, as given.  Follow-Up: Your physician recommends that you schedule a follow-up appointment in: 2 months  Any Other Special Instructions Will Be Listed Below (If Applicable).     If you need a refill on your cardiac medications before your next appointment, please call your pharmacy.   [place], RN, BSN

## 2017-10-12 NOTE — Addendum Note (Signed)
Addended by: Mattie Marlin on: 10/12/2017 09:16 AM   Modules accepted: Orders

## 2017-10-15 ENCOUNTER — Telehealth (HOSPITAL_COMMUNITY): Payer: Self-pay | Admitting: *Deleted

## 2017-10-15 NOTE — Telephone Encounter (Signed)
Left message on voicemail per DPR in reference to upcoming appointment scheduled on 10/16/17 at 1015 with detailed instructions given per Myocardial Perfusion Study Information Sheet for the test. LM to arrive 15 minutes early, and that it is imperative to arrive on time for appointment to keep from having the test rescheduled. If you need to cancel or reschedule your appointment, please call the office within 24 hours of your appointment. Failure to do so may result in a cancellation of your appointment, and a $50 no show fee. Phone number given for call back for any questions. Carzell Saldivar, Ranae Palms

## 2017-10-16 ENCOUNTER — Ambulatory Visit (HOSPITAL_COMMUNITY): Payer: PPO | Attending: Cardiovascular Disease

## 2017-10-16 VITALS — Ht 68.0 in | Wt 171.0 lb

## 2017-10-16 DIAGNOSIS — R0609 Other forms of dyspnea: Secondary | ICD-10-CM | POA: Diagnosis not present

## 2017-10-16 LAB — MYOCARDIAL PERFUSION IMAGING
CHL CUP MPHR: 147 {beats}/min
CHL CUP NUCLEAR SDS: 0
CHL CUP NUCLEAR SSS: 10
CSEPEW: 7 METS
Exercise duration (min): 6 min
Exercise duration (sec): 0 s
LHR: 0.29
LV dias vol: 55 mL (ref 62–150)
LV sys vol: 10 mL
NUC STRESS TID: 1.19
Peak HR: 142 {beats}/min
Percent HR: 96 %
Rest HR: 83 {beats}/min
SRS: 10

## 2017-10-16 MED ORDER — TECHNETIUM TC 99M TETROFOSMIN IV KIT
11.0000 | PACK | Freq: Once | INTRAVENOUS | Status: AC | PRN
  Administered 2017-10-16: 11 via INTRAVENOUS
  Filled 2017-10-16: qty 11

## 2017-10-16 MED ORDER — TECHNETIUM TC 99M TETROFOSMIN IV KIT
30.6000 | PACK | Freq: Once | INTRAVENOUS | Status: AC | PRN
  Administered 2017-10-16: 30.6 via INTRAVENOUS
  Filled 2017-10-16: qty 31

## 2017-10-17 ENCOUNTER — Institutional Professional Consult (permissible substitution): Payer: PPO | Admitting: Internal Medicine

## 2018-01-04 ENCOUNTER — Other Ambulatory Visit: Payer: Self-pay | Admitting: Internal Medicine

## 2018-01-04 DIAGNOSIS — E785 Hyperlipidemia, unspecified: Secondary | ICD-10-CM

## 2018-01-04 DIAGNOSIS — I10 Essential (primary) hypertension: Secondary | ICD-10-CM

## 2018-01-18 ENCOUNTER — Ambulatory Visit (INDEPENDENT_AMBULATORY_CARE_PROVIDER_SITE_OTHER): Payer: PPO

## 2018-01-18 DIAGNOSIS — Z23 Encounter for immunization: Secondary | ICD-10-CM

## 2018-01-18 NOTE — Progress Notes (Signed)
Injection given.   Brynnlee Cumpian J Avriel Kandel, MD  

## 2018-01-30 DIAGNOSIS — Z961 Presence of intraocular lens: Secondary | ICD-10-CM | POA: Diagnosis not present

## 2018-01-30 LAB — HM DIABETES EYE EXAM

## 2018-02-14 ENCOUNTER — Encounter: Payer: Self-pay | Admitting: Internal Medicine

## 2018-02-14 NOTE — Progress Notes (Signed)
Outside notes received. Information abstracted. Notes sent to scan.  

## 2018-02-27 DIAGNOSIS — Z85828 Personal history of other malignant neoplasm of skin: Secondary | ICD-10-CM | POA: Diagnosis not present

## 2018-02-27 DIAGNOSIS — B078 Other viral warts: Secondary | ICD-10-CM | POA: Diagnosis not present

## 2018-02-27 DIAGNOSIS — D225 Melanocytic nevi of trunk: Secondary | ICD-10-CM | POA: Diagnosis not present

## 2018-02-27 DIAGNOSIS — L821 Other seborrheic keratosis: Secondary | ICD-10-CM | POA: Diagnosis not present

## 2018-02-27 DIAGNOSIS — D2271 Melanocytic nevi of right lower limb, including hip: Secondary | ICD-10-CM | POA: Diagnosis not present

## 2018-02-27 DIAGNOSIS — L570 Actinic keratosis: Secondary | ICD-10-CM | POA: Diagnosis not present

## 2018-04-03 ENCOUNTER — Other Ambulatory Visit: Payer: Self-pay | Admitting: Internal Medicine

## 2018-04-03 DIAGNOSIS — I10 Essential (primary) hypertension: Secondary | ICD-10-CM

## 2018-05-25 ENCOUNTER — Encounter: Payer: Self-pay | Admitting: Gastroenterology

## 2018-07-31 ENCOUNTER — Other Ambulatory Visit: Payer: Self-pay | Admitting: Internal Medicine

## 2018-09-23 ENCOUNTER — Other Ambulatory Visit: Payer: Self-pay | Admitting: Internal Medicine

## 2018-09-23 DIAGNOSIS — E785 Hyperlipidemia, unspecified: Secondary | ICD-10-CM

## 2018-10-01 ENCOUNTER — Other Ambulatory Visit: Payer: Self-pay | Admitting: Internal Medicine

## 2018-10-01 ENCOUNTER — Other Ambulatory Visit: Payer: Self-pay | Admitting: Emergency Medicine

## 2018-10-01 DIAGNOSIS — E785 Hyperlipidemia, unspecified: Secondary | ICD-10-CM

## 2018-10-01 MED ORDER — ROSUVASTATIN CALCIUM 20 MG PO TABS: 20.0000 mg | ORAL_TABLET | Freq: Every day | ORAL | 0 refills | Status: DC

## 2018-10-17 ENCOUNTER — Other Ambulatory Visit: Payer: Self-pay

## 2018-10-17 ENCOUNTER — Ambulatory Visit (INDEPENDENT_AMBULATORY_CARE_PROVIDER_SITE_OTHER): Payer: PPO | Admitting: Internal Medicine

## 2018-10-17 ENCOUNTER — Other Ambulatory Visit (INDEPENDENT_AMBULATORY_CARE_PROVIDER_SITE_OTHER): Payer: PPO

## 2018-10-17 ENCOUNTER — Encounter: Payer: Self-pay | Admitting: Internal Medicine

## 2018-10-17 VITALS — BP 124/80 | HR 90 | Temp 97.9°F | Ht 68.0 in | Wt 158.0 lb

## 2018-10-17 DIAGNOSIS — E785 Hyperlipidemia, unspecified: Secondary | ICD-10-CM | POA: Diagnosis not present

## 2018-10-17 DIAGNOSIS — K5900 Constipation, unspecified: Secondary | ICD-10-CM

## 2018-10-17 DIAGNOSIS — E118 Type 2 diabetes mellitus with unspecified complications: Secondary | ICD-10-CM

## 2018-10-17 DIAGNOSIS — R972 Elevated prostate specific antigen [PSA]: Secondary | ICD-10-CM

## 2018-10-17 DIAGNOSIS — I10 Essential (primary) hypertension: Secondary | ICD-10-CM | POA: Diagnosis not present

## 2018-10-17 LAB — LIPID PANEL
Cholesterol: 122 mg/dL (ref 0–200)
HDL: 40.1 mg/dL (ref >39.00)
LDL Cholesterol: 46 mg/dL (ref 0–99)
NonHDL: 81.73
Total CHOL/HDL Ratio: 3
Triglycerides: 179 mg/dL — ABNORMAL HIGH (ref 0.0–149.0)
VLDL: 35.8 mg/dL (ref 0.0–40.0)

## 2018-10-17 LAB — CBC WITH DIFFERENTIAL/PLATELET
Basophils Absolute: 0.1 10*3/uL (ref 0.0–0.1)
Basophils Relative: 0.9 % (ref 0.0–3.0)
Eosinophils Absolute: 0.2 10*3/uL (ref 0.0–0.7)
Eosinophils Relative: 3.7 % (ref 0.0–5.0)
HCT: 47.7 % (ref 39.0–52.0)
Hemoglobin: 16.3 g/dL (ref 13.0–17.0)
Lymphocytes Relative: 21.7 % (ref 12.0–46.0)
Lymphs Abs: 1.4 10*3/uL (ref 0.7–4.0)
MCHC: 34.1 g/dL (ref 30.0–36.0)
MCV: 88 fl (ref 78.0–100.0)
Monocytes Absolute: 0.4 10*3/uL (ref 0.1–1.0)
Monocytes Relative: 7.1 % (ref 3.0–12.0)
Neutro Abs: 4.2 10*3/uL (ref 1.4–7.7)
Neutrophils Relative %: 66.6 % (ref 43.0–77.0)
Platelets: 161 10*3/uL (ref 150.0–400.0)
RBC: 5.42 Mil/uL (ref 4.22–5.81)
RDW: 13.4 % (ref 11.5–15.5)
WBC: 6.3 10*3/uL (ref 4.0–10.5)

## 2018-10-17 LAB — HEPATIC FUNCTION PANEL
ALT: 27 U/L (ref 0–53)
AST: 23 U/L (ref 0–37)
Albumin: 5 g/dL (ref 3.5–5.2)
Alkaline Phosphatase: 54 U/L (ref 39–117)
Bilirubin, Direct: 0.2 mg/dL (ref 0.0–0.3)
Total Bilirubin: 0.9 mg/dL (ref 0.2–1.2)
Total Protein: 7.3 g/dL (ref 6.0–8.3)

## 2018-10-17 LAB — URINALYSIS, ROUTINE W REFLEX MICROSCOPIC
Bilirubin Urine: NEGATIVE
Hgb urine dipstick: NEGATIVE
Ketones, ur: NEGATIVE
Leukocytes,Ua: NEGATIVE
Nitrite: NEGATIVE
Specific Gravity, Urine: 1.015 (ref 1.000–1.030)
Total Protein, Urine: NEGATIVE
Urine Glucose: >=1000 — AB
Urobilinogen, UA: 1 (ref 0.0–1.0)
pH: 7.5 (ref 5.0–8.0)

## 2018-10-17 LAB — BASIC METABOLIC PANEL
BUN: 19 mg/dL (ref 6–23)
CO2: 31 mEq/L (ref 19–32)
Calcium: 10 mg/dL (ref 8.4–10.5)
Chloride: 97 mEq/L (ref 96–112)
Creatinine, Ser: 1.12 mg/dL (ref 0.40–1.50)
GFR: 63.99 mL/min (ref >60.00)
Glucose, Bld: 128 mg/dL — ABNORMAL HIGH (ref 70–99)
Potassium: 3.8 mEq/L (ref 3.5–5.1)
Sodium: 138 mEq/L (ref 135–145)

## 2018-10-17 LAB — HEMOGLOBIN A1C: Hgb A1c MFr Bld: 6.9 % — ABNORMAL HIGH (ref 4.6–6.5)

## 2018-10-17 LAB — MICROALBUMIN / CREATININE URINE RATIO
Creatinine,U: 94.9 mg/dL
Microalb Creat Ratio: 3.6 mg/g (ref 0.0–30.0)
Microalb, Ur: 3.4 mg/dL — ABNORMAL HIGH (ref 0.0–1.9)

## 2018-10-17 LAB — TSH: TSH: 2.58 u[IU]/mL (ref 0.35–4.50)

## 2018-10-17 LAB — PSA: PSA: 3.12 ng/mL (ref 0.10–4.00)

## 2018-10-17 MED ORDER — CONTOUR NEXT TEST VI STRP: ORAL_STRIP | 1 refills | Status: DC

## 2018-10-17 MED ORDER — CONTOUR NEXT MONITOR W/DEVICE KIT: 1.0000 | PACK | Freq: Two times a day (BID) | 0 refills | Status: AC

## 2018-10-17 NOTE — Progress Notes (Signed)
Subjective:  Patient ID: Jerry Palmer, male    DOB: 04-14-1945  Age: 73 y.o. MRN: [id number]  CC: Hypertension, Hyperlipidemia, and Diabetes   HPI Jerry Palmer presents for f/up - He has been working on his lifestyle modifications and has been able to lose weight.  He is active and denies any recent episodes of CP, DOE, palpitations, edema, or fatigue.  He tells me his blood pressure at home is been around 127/78.  He does not monitor his blood sugar.  Outpatient Medications Prior to Visit  Medication Sig Dispense Refill  . aspirin EC 81 MG tablet Take 1 tablet (81 mg total) by mouth daily. 90 tablet 3  . chlorthalidone (HYGROTON) 25 MG tablet TAKE ONE TABLET BY MOUTH DAILY 90 tablet 1  . Empagliflozin-metFORMIN HCl (SYNJARDY PO) Take by mouth.    . rosuvastatin (CRESTOR) 20 MG tablet Take 1 tablet (20 mg total) by mouth daily. 90 tablet 0  . SYNJARDY 07-998 MG TABS TAKE 1 TABLET BY MOUTH TWICE A DAY 180 tablet 0   No facility-administered medications prior to visit.     ROS Review of Systems  Constitutional: Negative for chills, diaphoresis, fatigue and fever.  HENT: Negative.   Eyes: Negative for visual disturbance.  Respiratory: Negative for cough, chest tightness, shortness of breath and wheezing.   Cardiovascular: Negative for chest pain, palpitations and leg swelling.  Gastrointestinal: Positive for constipation. Negative for diarrhea, nausea and vomiting.       He has mild intermittent constipation that is adequately treated with a high-fiber diet.  Endocrine: Negative.   Genitourinary: Negative.  Negative for difficulty urinating and hematuria.  Musculoskeletal: Negative.  Negative for arthralgias and myalgias.  Skin: Negative for color change, pallor and rash.  Neurological: Negative.  Negative for dizziness, weakness and light-headedness.  Hematological: Negative for adenopathy. Does not bruise/bleed easily.  Psychiatric/Behavioral: Negative.     Objective:   BP 124/80 (BP Location: Left Arm, Patient Position: Sitting, Cuff Size: Normal)   Pulse 90   Temp 97.9 F (36.6 C) (Oral)   Ht 5' 8" (1.727 m)   Wt 158 lb 0.6 oz (71.7 kg)   SpO2 93%   BMI 24.03 kg/m   BP Readings from Last 3 Encounters:  10/17/18 124/80  10/11/17 140/80  10/10/17 122/86    Wt Readings from Last 3 Encounters:  10/17/18 158 lb 0.6 oz (71.7 kg)  10/16/17 171 lb (77.6 kg)  10/11/17 171 lb (77.6 kg)    Physical Exam Vitals signs reviewed.  Constitutional:      Appearance: He is not ill-appearing or diaphoretic.  HENT:     Nose: Nose normal.     Mouth/Throat:     Mouth: Mucous membranes are moist.  Eyes:     General: No scleral icterus.    Conjunctiva/sclera: Conjunctivae normal.  Neck:     Musculoskeletal: Normal range of motion. No neck rigidity.  Cardiovascular:     Rate and Rhythm: Normal rate and regular rhythm.     Heart sounds: No murmur.  Pulmonary:     Effort: Pulmonary effort is normal. No respiratory distress.     Breath sounds: No stridor. No wheezing, rhonchi or rales.  Abdominal:     General: Abdomen is flat. Bowel sounds are normal. There is no distension.     Palpations: There is no hepatomegaly or splenomegaly.     Tenderness: There is no abdominal tenderness.     Hernia: There is no hernia in the left  inguinal area or right inguinal area.  Genitourinary:    Pubic Area: No rash.      Penis: Normal. No discharge, swelling or lesions.      Scrotum/Testes: Normal.        Right: Mass or tenderness not present.        Left: Mass or tenderness not present.     Epididymis:     Right: Normal. No mass or tenderness.     Left: Normal. No mass.     Prostate: Enlarged (1+ smooth symm BPH). Not tender and no nodules present.     Rectum: Normal. Guaiac result negative. No mass, tenderness, anal fissure, external hemorrhoid or internal hemorrhoid. Normal anal tone.  Musculoskeletal: Normal range of motion.     Right lower leg: No edema.      Left lower leg: No edema.  Lymphadenopathy:     Cervical: No cervical adenopathy.     Lower Body: No right inguinal adenopathy. No left inguinal adenopathy.  Skin:    General: Skin is warm and dry.     Coloration: Skin is not pale.  Neurological:     General: No focal deficit present.  Psychiatric:        Mood and Affect: Mood normal.        Behavior: Behavior normal.     Lab Results  Component Value Date   WBC 6.3 10/17/2018   HGB 16.3 10/17/2018   HCT 47.7 10/17/2018   PLT 161.0 10/17/2018   GLUCOSE 128 (H) 10/17/2018   CHOL 122 10/17/2018   TRIG 179.0 (H) 10/17/2018   HDL 40.10 10/17/2018   LDLDIRECT 157.4 12/30/2009   LDLCALC 46 10/17/2018   ALT 27 10/17/2018   AST 23 10/17/2018   NA 138 10/17/2018   K 3.8 10/17/2018   CL 97 10/17/2018   CREATININE 1.12 10/17/2018   BUN 19 10/17/2018   CO2 31 10/17/2018   TSH 2.58 10/17/2018   PSA 3.12 10/17/2018   HGBA1C 6.9 (H) 10/17/2018   MICROALBUR 3.4 (H) 10/17/2018    No results found.  Assessment & Plan:   Jerry Palmer was seen today for hypertension, hyperlipidemia and diabetes.  Diagnoses and all orders for this visit:  Dyslipidemia, goal LDL below 70- He has achieved his LDL goal and is doing well on the statin. -     Lipid panel; Future -     Hepatic function panel; Future -     TSH; Future  Essential hypertension- His blood pressure is well controlled.  Electrolytes and renal function are normal. -     CBC with Differential/Platelet; Future -     Basic metabolic panel; Future -     TSH; Future -     Urinalysis, Routine w reflex microscopic; Future  Type 2 diabetes mellitus with complication, without long-term current use of insulin ([place])- His A1c is at 6.9%.  His blood sugars are adequately well controlled.  Will continue the current glycemic regimen. -     Basic metabolic panel; Future -     Hemoglobin A1c; Future -     Urinalysis, Routine w reflex microscopic; Future -     Microalbumin / creatinine urine  ratio; Future -     Blood Glucose Monitoring Suppl (CONTOUR NEXT MONITOR) w/Device KIT; 1 Act by Does not apply route 2 (two) times a day. -     glucose blood (CONTOUR NEXT TEST) test strip; One Act BID -     Amb Referral to Nutrition and Diabetic E -  HM Diabetes Foot Exam -     C-peptide; Future  Constipation, unspecified constipation type- his labs are negative for secondary causes. -     Basic metabolic panel; Future  Elevated PSA, between 10 and less than 20 ng/ml- His PSA is down to 3.12.  This is reassuring that he does not have prostate cancer. -     PSA; Future   I am having Jerry Palmer start on Contour Next Monitor and Contour Next Test. I am also having him maintain his aspirin EC, Empagliflozin-metFORMIN HCl (SYNJARDY PO), chlorthalidone, Synjardy, and rosuvastatin.  Meds ordered this encounter  Medications  . Blood Glucose Monitoring Suppl (CONTOUR NEXT MONITOR) w/Device KIT    Sig: 1 Act by Does not apply route 2 (two) times a day.    Dispense:  2 kit    Refill:  0  . glucose blood (CONTOUR NEXT TEST) test strip    Sig: One Act BID    Dispense:  300 each    Refill:  1     Follow-up: Return in about 4 months (around 02/17/2019).  Scarlette Calico, MD

## 2018-10-17 NOTE — Patient Instructions (Signed)
Type 2 Diabetes Mellitus, Diagnosis, Adult Type 2 diabetes (type 2 diabetes mellitus) is a long-term (chronic) disease. In type 2 diabetes, one or both of these problems may be present:  The pancreas does not make enough of a hormone called insulin.  Cells in the body do not respond properly to insulin that the body makes (insulin resistance). Normally, insulin allows blood sugar (glucose) to enter cells in the body. The cells use glucose for energy. Insulin resistance or lack of insulin causes excess glucose to build up in the blood instead of going into cells. As a result, high blood glucose (hyperglycemia) develops. What increases the risk? The following factors may make you more likely to develop type 2 diabetes:  Having a family member with type 2 diabetes.  Being overweight or obese.  Having an inactive (sedentary) lifestyle.  Having been diagnosed with insulin resistance.  Having a history of prediabetes, gestational diabetes, or polycystic ovary syndrome (PCOS).  Being of American-Indian, African-American, Hispanic/Latino, or Asian/Pacific Islander descent. What are the signs or symptoms? In the early stage of this condition, you may not have symptoms. Symptoms develop slowly and may include:  Increased thirst (polydipsia).  Increased hunger(polyphagia).  Increased urination (polyuria).  Increased urination during the night (nocturia).  Unexplained weight loss.  Frequent infections that keep coming back (recurring).  Fatigue.  Weakness.  Vision changes, such as blurry vision.  Cuts or bruises that are slow to heal.  Tingling or numbness in the hands or feet.  Dark patches on the skin (acanthosis nigricans). How is this diagnosed? This condition is diagnosed based on your symptoms, your medical history, a physical exam, and your blood glucose level. Your blood glucose may be checked with one or more of the following blood tests:  A fasting blood glucose (FBG)  test. You will not be allowed to eat (you will fast) for 8 hours or longer before a blood sample is taken.  A random blood glucose test. This test checks blood glucose at any time of day regardless of when you ate.  An A1c (hemoglobin A1c) blood test. This test provides information about blood glucose control over the previous 2-3 months.  An oral glucose tolerance test (OGTT). This test measures your blood glucose at two times: ? After fasting. This is your baseline blood glucose level. ? Two hours after drinking a beverage that contains glucose. You may be diagnosed with type 2 diabetes if:  Your FBG level is 126 mg/dL (7.0 mmol/L) or higher.  Your random blood glucose level is 200 mg/dL (11.1 mmol/L) or higher.  Your A1c level is 6.5% or higher.  Your OGTT result is higher than 200 mg/dL (11.1 mmol/L). These blood tests may be repeated to confirm your diagnosis. How is this treated? Your treatment may be managed by a specialist called an endocrinologist. Type 2 diabetes may be treated by following instructions from your health care provider about:  Making diet and lifestyle changes. This may include: ? Following an individualized nutrition plan that is developed by a diet and nutrition specialist (registered dietitian). ? Exercising regularly. ? Finding ways to manage stress.  Checking your blood glucose level as often as told.  Taking diabetes medicines or insulin daily. This helps to keep your blood glucose levels in the healthy range. ? If you use insulin, you may need to adjust the dosage depending on how physically active you are and what foods you eat. Your health care provider will tell you how to adjust your dosage.    Taking medicines to help prevent complications from diabetes, such as: ? Aspirin. ? Medicine to lower cholesterol. ? Medicine to control blood pressure. Your health care provider will set individualized treatment goals for you. Your goals will be based on  your age, other medical conditions you have, and how you respond to diabetes treatment. Generally, the goal of treatment is to maintain the following blood glucose levels:  Before meals (preprandial): 80-130 mg/dL (4.4-7.2 mmol/L).  After meals (postprandial): below 180 mg/dL (10 mmol/L).  A1c level: less than 7%. Follow these instructions at home: Questions to ask your health care provider  Consider asking the following questions: ? Do I need to meet with a diabetes educator? ? Where can I find a support group for people with diabetes? ? What equipment will I need to manage my diabetes at home? ? What diabetes medicines do I need, and when should I take them? ? How often do I need to check my blood glucose? ? What number can I call if I have questions? ? When is my next appointment? General instructions  Take over-the-counter and prescription medicines only as told by your health care provider.  Keep all follow-up visits as told by your health care provider. This is important.  For more information about diabetes, visit: ? American Diabetes Association (ADA): www.diabetes.org ? American Association of Diabetes Educators (AADE): www.diabeteseducator.org Contact a health care provider if:  Your blood glucose is at or above 240 mg/dL (13.3 mmol/L) for 2 days in a row.  You have been sick or have had a fever for 2 days or longer, and you are not getting better.  You have any of the following problems for more than 6 hours: ? You cannot eat or drink. ? You have nausea and vomiting. ? You have diarrhea. Get help right away if:  Your blood glucose is lower than 54 mg/dL (3.0 mmol/L).  You become confused or you have trouble thinking clearly.  You have difficulty breathing.  You have moderate or large ketone levels in your urine. Summary  Type 2 diabetes (type 2 diabetes mellitus) is a long-term (chronic) disease. In type 2 diabetes, the pancreas does not make enough of a  hormone called insulin, or cells in the body do not respond properly to insulin that the body makes (insulin resistance).  This condition is treated by making diet and lifestyle changes and taking diabetes medicines or insulin.  Your health care provider will set individualized treatment goals for you. Your goals will be based on your age, other medical conditions you have, and how you respond to diabetes treatment.  Keep all follow-up visits as told by your health care provider. This is important. This information is not intended to replace advice given to you by your health care provider. Make sure you discuss any questions you have with your health care provider. Document Released: 03/13/2005 Document Revised: 05/11/2017 Document Reviewed: 04/16/2015 Elsevier Patient Education  2020 [employer]  

## 2018-10-18 ENCOUNTER — Encounter: Payer: Self-pay | Admitting: Internal Medicine

## 2018-10-18 LAB — C-PEPTIDE: C-Peptide: 3.09 ng/mL (ref 0.80–3.85)

## 2018-12-04 ENCOUNTER — Encounter: Payer: PPO | Attending: Internal Medicine | Admitting: *Deleted

## 2018-12-04 ENCOUNTER — Other Ambulatory Visit: Payer: Self-pay

## 2018-12-04 DIAGNOSIS — E118 Type 2 diabetes mellitus with unspecified complications: Secondary | ICD-10-CM

## 2018-12-04 NOTE — Patient Instructions (Addendum)
Plan:  Aim for 3 Carb Choices per meal (45 grams) +/- 1 either way  Aim for 0-2 Carbs per snack if hungry  Include protein in moderation with your meals and snacks Continue with your activity level daily as tolerated Consider checking BG at alternate times per day  Continue taking medication as directed by MD When checking your blood sugar:  Use warm water  Point finger downward and massage entire finger  Bring strip in from the top, not the side

## 2018-12-04 NOTE — Progress Notes (Signed)
Diabetes Self-Management Education  Visit Type: First/Initial  Appt. Start Time: 0800 Appt. End Time: 0930  12/04/2018  [name], identified by name and date of birth, is a 73 y.o. male with a diagnosis of Diabetes: Type 2. He is here with his wife who prepares most of his meals. No previous diabetes education. He has a meter but rarely uses it due to the effort to get enough blood for the strip. 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  ASSESSMENT  There were no vitals taken for this visit. There is no height or weight on file to calculate BMI.  Diabetes Self-Management Education - 12/04/18 0830      Visit Information   Visit Type  First/Initial      Initial Visit   Diabetes Type  Type 2    Are you currently following a meal plan?  Yes    What type of meal plan do you follow?  lots of vegetables    Are you taking your medications as prescribed?  Yes    Date Diagnosed  1 year ago      Health Coping   How would you rate your overall health?  Good      Psychosocial Assessment   Patient Belief/Attitude about Diabetes  Motivated to manage diabetes    Other persons present  Patient    Patient Concerns  Nutrition/Meal planning;Glycemic Control    Special Needs  Simplified materials    Preferred Learning Style  Visual;Auditory;Hands on    Learning Readiness  Change in progress    How often do you need to have someone help you when you read instructions, pamphlets, or other written materials from your doctor or pharmacy?  3 - Sometimes    What is the last grade level you completed in school?  8th      Pre-Education Assessment   Patient understands the diabetes disease and treatment process.  Needs Instruction    Patient understands incorporating nutritional management into lifestyle.  Needs Instruction    Patient undertands incorporating physical activity into lifestyle.  Needs Instruction    Patient understands using  medications safely.  Needs Instruction    Patient understands monitoring blood glucose, interpreting and using results  Needs Instruction    Patient understands prevention, detection, and treatment of acute complications.  Needs Instruction    Patient understands prevention, detection, and treatment of chronic complications.  Needs Instruction    Patient understands how to develop strategies to address psychosocial issues.  Needs Instruction      Complications   Last HgB A1C per patient/outside source  6.9 %    How often do you check your blood sugar?  3-4 times / week   states it is hard to get enough blood for the strip   Number of hypoglycemic episodes per month  0    Have you had a dilated eye exam in the past 12 months?  Yes    Have you had a dental exam in the past 12 months?  Yes    Are you checking your feet?  Yes    How many days per week are you checking your feet?  7      Dietary Intake   Breakfast  restaurant often: bacon and egg plate OR BLT sandwich OR ham and egg sandwich    Snack (morning)  not usually    Lunch  PNB crackers OR another snack  Snack (afternoon)  ice cream occasionally    Dinner  hot dogs on bun with slaw and chili OR meat, vegetables or salad, occasionally starch    Snack (evening)  apple most nights,    Beverage(s)  black coffee, G2 Gatorade, water, diet soda,      Exercise   Exercise Type  Moderate (swimming / aerobic walking)   builds decks, etc, yard work daily   How many days per week to you exercise?  6    How many minutes per day do you exercise?  60    Total minutes per week of exercise  360      Patient Education   Previous Diabetes Education  No    Disease state   Definition of diabetes, type 1 and 2, and the diagnosis of diabetes;Factors that contribute to the development of diabetes    Nutrition management   Role of diet in the treatment of diabetes and the relationship between the three main macronutrients and blood glucose  level;Carbohydrate counting    Physical activity and exercise   Role of exercise on diabetes management, blood pressure control and cardiac health.;Helped patient identify appropriate exercises in relation to his/her diabetes, diabetes complications and other health issue.    Monitoring  Identified appropriate SMBG and/or A1C goals.    Chronic complications  Relationship between chronic complications and blood glucose control      Individualized Goals (developed by patient)   Nutrition  Follow meal plan discussed    Physical Activity  Exercise 3-5 times per week    Medications  take my medication as prescribed    Monitoring   test blood glucose pre and post meals as discussed      Post-Education Assessment   Patient understands the diabetes disease and treatment process.  Demonstrates understanding / competency    Patient understands incorporating nutritional management into lifestyle.  Demonstrates understanding / competency    Patient undertands incorporating physical activity into lifestyle.  Demonstrates understanding / competency    Patient understands monitoring blood glucose, interpreting and using results  Demonstrates understanding / competency    Patient understands prevention, detection, and treatment of chronic complications.  Demonstrates understanding / competency    Patient understands how to develop strategies to address psychosocial issues.  Demonstrates understanding / competency    Patient understands how to develop strategies to promote health/change behavior.  Demonstrates understanding / competency      Outcomes   Expected Outcomes  Demonstrated interest in learning. Expect positive outcomes    Future DMSE  PRN    Program Status  Not Completed       Individualized Plan for Diabetes Self-Management Training:   Learning Objective:  Patient will have a greater understanding of diabetes self-management. Patient education plan is to attend individual and/or group  sessions per assessed needs and concerns.   Plan:   Patient Instructions  Plan:  Aim for 3 Carb Choices per meal (45 grams) +/- 1 either way  Aim for 0-2 Carbs per snack if hungry  Include protein in moderation with your meals and snacks Continue with your activity level daily as tolerated Consider checking BG at alternate times per day  Continue taking medication as directed by MD When checking your blood sugar:  Use warm water  Point finger downward and massage entire finger  Bring strip in from the top, not the side  Expected Outcomes:  Demonstrated interest in learning. Expect positive outcomes  Education material provided: A1C conversion sheet, Meal  plan card and Carbohydrate counting sheet  If problems or questions, patient to contact team via:  Phone  Future DSME appointment: PRN

## 2018-12-20 ENCOUNTER — Other Ambulatory Visit: Payer: Self-pay

## 2018-12-20 ENCOUNTER — Ambulatory Visit (INDEPENDENT_AMBULATORY_CARE_PROVIDER_SITE_OTHER): Payer: PPO

## 2018-12-20 DIAGNOSIS — Z23 Encounter for immunization: Secondary | ICD-10-CM | POA: Diagnosis not present

## 2018-12-26 ENCOUNTER — Other Ambulatory Visit: Payer: Self-pay | Admitting: Internal Medicine

## 2018-12-26 DIAGNOSIS — I10 Essential (primary) hypertension: Secondary | ICD-10-CM

## 2018-12-26 DIAGNOSIS — E785 Hyperlipidemia, unspecified: Secondary | ICD-10-CM

## 2019-01-04 ENCOUNTER — Other Ambulatory Visit: Payer: Self-pay | Admitting: Internal Medicine

## 2019-02-19 DIAGNOSIS — D3132 Benign neoplasm of left choroid: Secondary | ICD-10-CM | POA: Diagnosis not present

## 2019-02-19 LAB — HM DIABETES EYE EXAM

## 2019-04-02 ENCOUNTER — Ambulatory Visit: Payer: PPO | Admitting: Internal Medicine

## 2019-06-18 ENCOUNTER — Other Ambulatory Visit: Payer: Self-pay | Admitting: Internal Medicine

## 2019-06-18 DIAGNOSIS — I10 Essential (primary) hypertension: Secondary | ICD-10-CM

## 2019-06-18 DIAGNOSIS — E785 Hyperlipidemia, unspecified: Secondary | ICD-10-CM

## 2019-06-30 ENCOUNTER — Other Ambulatory Visit: Payer: Self-pay | Admitting: Internal Medicine

## 2019-06-30 DIAGNOSIS — E785 Hyperlipidemia, unspecified: Secondary | ICD-10-CM

## 2019-06-30 DIAGNOSIS — I10 Essential (primary) hypertension: Secondary | ICD-10-CM

## 2019-07-02 ENCOUNTER — Encounter: Payer: Self-pay | Admitting: Internal Medicine

## 2019-07-02 ENCOUNTER — Other Ambulatory Visit: Payer: Self-pay

## 2019-07-02 ENCOUNTER — Ambulatory Visit (INDEPENDENT_AMBULATORY_CARE_PROVIDER_SITE_OTHER): Payer: PPO | Admitting: Internal Medicine

## 2019-07-02 VITALS — BP 132/72 | HR 74 | Temp 99.0°F | Resp 16 | Ht 68.0 in | Wt 160.1 lb

## 2019-07-02 DIAGNOSIS — N1831 Chronic kidney disease, stage 3a: Secondary | ICD-10-CM | POA: Insufficient documentation

## 2019-07-02 DIAGNOSIS — E118 Type 2 diabetes mellitus with unspecified complications: Secondary | ICD-10-CM

## 2019-07-02 DIAGNOSIS — Z0000 Encounter for general adult medical examination without abnormal findings: Secondary | ICD-10-CM

## 2019-07-02 DIAGNOSIS — Z23 Encounter for immunization: Secondary | ICD-10-CM | POA: Diagnosis not present

## 2019-07-02 DIAGNOSIS — I10 Essential (primary) hypertension: Secondary | ICD-10-CM | POA: Diagnosis not present

## 2019-07-02 DIAGNOSIS — E785 Hyperlipidemia, unspecified: Secondary | ICD-10-CM

## 2019-07-02 LAB — HEPATIC FUNCTION PANEL
ALT: 22 U/L (ref 0–53)
AST: 22 U/L (ref 0–37)
Albumin: 4.8 g/dL (ref 3.5–5.2)
Alkaline Phosphatase: 62 U/L (ref 39–117)
Bilirubin, Direct: 0.2 mg/dL (ref 0.0–0.3)
Total Bilirubin: 1 mg/dL (ref 0.2–1.2)
Total Protein: 7.4 g/dL (ref 6.0–8.3)

## 2019-07-02 LAB — BASIC METABOLIC PANEL
BUN: 24 mg/dL — ABNORMAL HIGH (ref 6–23)
CO2: 31 mEq/L (ref 19–32)
Calcium: 10 mg/dL (ref 8.4–10.5)
Chloride: 100 mEq/L (ref 96–112)
Creatinine, Ser: 1.21 mg/dL (ref 0.40–1.50)
GFR: 58.41 mL/min — ABNORMAL LOW (ref >60.00)
Glucose, Bld: 136 mg/dL — ABNORMAL HIGH (ref 70–99)
Potassium: 3.5 mEq/L (ref 3.5–5.1)
Sodium: 141 mEq/L (ref 135–145)

## 2019-07-02 LAB — LIPID PANEL
Cholesterol: 135 mg/dL (ref 0–200)
HDL: 39.3 mg/dL (ref >39.00)
LDL Cholesterol: 65 mg/dL (ref 0–99)
NonHDL: 95.22
Total CHOL/HDL Ratio: 3
Triglycerides: 151 mg/dL — ABNORMAL HIGH (ref 0.0–149.0)
VLDL: 30.2 mg/dL (ref 0.0–40.0)

## 2019-07-02 LAB — HEMOGLOBIN A1C: Hgb A1c MFr Bld: 6.9 % — ABNORMAL HIGH (ref 4.6–6.5)

## 2019-07-02 MED ORDER — SYNJARDY 5-1000 MG PO TABS: 1.0000 | ORAL_TABLET | Freq: Two times a day (BID) | ORAL | 0 refills | Status: DC

## 2019-07-02 NOTE — Progress Notes (Signed)
Subjective:  Patient ID: Jerry Palmer, male    DOB: Dec 08, 1945  Age: 74 y.o. MRN: [id number]  CC: Hypertension, Diabetes, Hyperlipidemia, and Annual Exam  This visit occurred during the SARS-CoV-2 public health emergency.  Safety protocols were in place, including screening questions prior to the visit, additional usage of staff PPE, and extensive cleaning of exam room while observing appropriate contact time as indicated for disinfecting solutions.    HPI Jerry Palmer presents for a CPX.  He is active and denies any recent episodes of chest pain, shortness of breath, palpitations, edema, or fatigue.  He is only taking Synjardy once a day.  He tells me his blood sugars have been well controlled and he denies polys.  Outpatient Medications Prior to Visit  Medication Sig Dispense Refill  . aspirin EC 81 MG tablet Take 1 tablet (81 mg total) by mouth daily. 90 tablet 3  . Blood Glucose Monitoring Suppl (CONTOUR NEXT MONITOR) w/Device KIT 1 Act by Does not apply route 2 (two) times a day. 2 kit 0  . glucose blood (CONTOUR NEXT TEST) test strip One Act BID 300 each 1  . chlorthalidone (HYGROTON) 25 MG tablet TAKE 1 TABLET BY MOUTH EVERY DAY 90 tablet 1  . Empagliflozin-metFORMIN HCl (SYNJARDY PO) Take by mouth.    . rosuvastatin (CRESTOR) 20 MG tablet TAKE 1 TABLET BY MOUTH EVERY DAY 90 tablet 1  . SYNJARDY 07-998 MG TABS TAKE 1 TABLET BY MOUTH TWICE A DAY 60 tablet 3   No facility-administered medications prior to visit.    ROS Review of Systems  Constitutional: Negative.  Negative for chills, diaphoresis, fatigue and fever.  HENT: Negative.   Eyes: Negative for visual disturbance.  Respiratory: Negative for cough, chest tightness, shortness of breath and wheezing.   Cardiovascular: Negative for chest pain, palpitations and leg swelling.  Gastrointestinal: Negative for abdominal pain, constipation, diarrhea, nausea and vomiting.  Endocrine: Negative.   Genitourinary: Negative.   Negative for difficulty urinating, dysuria and hematuria.  Musculoskeletal: Negative for arthralgias and myalgias.  Skin: Negative.  Negative for color change and pallor.  Neurological: Negative.  Negative for dizziness, weakness, light-headedness and headaches.  Hematological: Negative for adenopathy. Does not bruise/bleed easily.  Psychiatric/Behavioral: Negative.     Objective:  BP 132/72 (BP Location: Left Arm, Patient Position: Sitting, Cuff Size: Large)   Pulse 74   Temp 99 F (37.2 C) (Oral)   Resp 16   Ht _0  (1.727 m)   Wt 160 lb 2 oz (72.6 kg)   SpO2 95%   BMI 24.35 kg/m   BP Readings from Last 3 Encounters:  07/02/19 132/72  10/17/18 124/80  10/11/17 140/80    Wt Readings from Last 3 Encounters:  07/02/19 160 lb 2 oz (72.6 kg)  10/17/18 158 lb 0.6 oz (71.7 kg)  10/16/17 171 lb (77.6 kg)    Physical Exam Vitals reviewed.  Constitutional:      Appearance: Normal appearance.  HENT:     Nose: Nose normal.     Mouth/Throat:     Mouth: Mucous membranes are moist.  Eyes:     General: No scleral icterus.    Conjunctiva/sclera: Conjunctivae normal.  Cardiovascular:     Rate and Rhythm: Normal rate and regular rhythm.     Heart sounds: No murmur.  Pulmonary:     Effort: Pulmonary effort is normal.     Breath sounds: No stridor. No wheezing, rhonchi or rales.  Abdominal:  General: Abdomen is flat. Bowel sounds are normal. There is no distension.     Palpations: Abdomen is soft. There is no hepatomegaly, splenomegaly or mass.     Tenderness: There is no abdominal tenderness.  Musculoskeletal:        General: Normal range of motion.     Cervical back: Neck supple.     Right lower leg: No edema.     Left lower leg: No edema.  Lymphadenopathy:     Cervical: No cervical adenopathy.  Skin:    General: Skin is warm and dry.     Coloration: Skin is not pale.  Neurological:     General: No focal deficit present.     Mental Status: He is alert.    Psychiatric:        Mood and Affect: Mood normal.        Behavior: Behavior normal.     Lab Results  Component Value Date   WBC 6.3 10/17/2018   HGB 16.3 10/17/2018   HCT 47.7 10/17/2018   PLT 161.0 10/17/2018   GLUCOSE 136 (H) 07/02/2019   CHOL 135 07/02/2019   TRIG 151.0 (H) 07/02/2019   HDL 39.30 07/02/2019   LDLDIRECT 157.4 12/30/2009   LDLCALC 65 07/02/2019   ALT 22 07/02/2019   AST 22 07/02/2019   NA 141 07/02/2019   K 3.5 07/02/2019   CL 100 07/02/2019   CREATININE 1.21 07/02/2019   BUN 24 (H) 07/02/2019   CO2 31 07/02/2019   TSH 2.58 10/17/2018   PSA 3.12 10/17/2018   HGBA1C 6.9 (H) 07/02/2019   MICROALBUR 3.4 (H) 10/17/2018    No results found.  Assessment & Plan:   Trigger was seen today for hypertension, diabetes, hyperlipidemia and annual exam.  Diagnoses and all orders for this visit:  Essential hypertension- His blood pressure is well controlled but he has developed a mild prerenal azotemia with a slight decrease in his renal function.  I have asked him to stop taking chlorthalidone.  I have asked him to increase the dose of Synjardy and I think the higher dose of the SGLT2 inhibitor will help keep his blood pressure at the goal of around 130/80. -     Basic metabolic panel; Future -     Basic metabolic panel  Type II diabetes mellitus with manifestations ([place])- His A1c is 6.7%.  His blood sugars are adequately well controlled.  I have asked him to go ahead and take a therapeutic dose of Synjardy which is twice a day. -     Basic metabolic panel; Future -     Hemoglobin A1c; Future -     Hemoglobin A1c -     Basic metabolic panel -     Empagliflozin-metFORMIN HCl (SYNJARDY) 07-998 MG TABS; Take 1 tablet by mouth 2 (two) times daily.  Dyslipidemia, goal LDL below 70 - He has achieved his LDL goal and is doing well on the statin. -     Lipid panel; Future -     Hepatic function panel; Future -     Hepatic function panel -     Lipid panel  Routine  adult health maintenance- Exam completed, labs reviewed, vaccines reviewed and updated, cancer screenings are up-to-date, patient education was given.  Need for Tdap vaccination -     Tdap vaccine greater than or equal to 7yo IM  Stage 3a chronic kidney disease- He will avoid nephrotoxic agents.  See above.   I have discontinued [facility] L[facility] Jo[facility]'s  chlorthalidone. I have also changed his Synjardy. Additionally, I am having him maintain his aspirin EC, Contour Next Monitor, and Contour Next Test.  Meds ordered this encounter  Medications  . Empagliflozin-metFORMIN HCl (SYNJARDY) 07-998 MG TABS    Sig: Take 1 tablet by mouth 2 (two) times daily.    Dispense:  180 tablet    Refill:  0     Follow-up: No follow-ups on file.  Scarlette Calico, MD

## 2019-07-03 ENCOUNTER — Other Ambulatory Visit: Payer: Self-pay | Admitting: Internal Medicine

## 2019-07-03 DIAGNOSIS — E785 Hyperlipidemia, unspecified: Secondary | ICD-10-CM

## 2019-07-03 DIAGNOSIS — I10 Essential (primary) hypertension: Secondary | ICD-10-CM

## 2019-07-04 ENCOUNTER — Other Ambulatory Visit: Payer: Self-pay | Admitting: Internal Medicine

## 2019-07-04 DIAGNOSIS — E785 Hyperlipidemia, unspecified: Secondary | ICD-10-CM

## 2019-07-04 MED ORDER — ROSUVASTATIN CALCIUM 20 MG PO TABS: 20.0000 mg | ORAL_TABLET | Freq: Every day | ORAL | 1 refills | Status: DC

## 2019-10-01 DIAGNOSIS — H43812 Vitreous degeneration, left eye: Secondary | ICD-10-CM | POA: Diagnosis not present

## 2019-12-17 DIAGNOSIS — E113293 Type 2 diabetes mellitus with mild nonproliferative diabetic retinopathy without macular edema, bilateral: Secondary | ICD-10-CM | POA: Diagnosis not present

## 2019-12-17 LAB — HM DIABETES EYE EXAM

## 2019-12-18 ENCOUNTER — Other Ambulatory Visit: Payer: Self-pay | Admitting: Internal Medicine

## 2019-12-18 DIAGNOSIS — E785 Hyperlipidemia, unspecified: Secondary | ICD-10-CM

## 2020-03-31 ENCOUNTER — Other Ambulatory Visit: Payer: Self-pay | Admitting: Internal Medicine

## 2020-03-31 DIAGNOSIS — E118 Type 2 diabetes mellitus with unspecified complications: Secondary | ICD-10-CM

## 2020-05-03 ENCOUNTER — Telehealth: Payer: Self-pay | Admitting: Internal Medicine

## 2020-05-03 NOTE — Telephone Encounter (Signed)
LVM for pt to rtn my call to schedule AWV with [facility]. Please schedule AWV if pt calls the office  

## 2020-05-08 ENCOUNTER — Telehealth: Payer: Self-pay | Admitting: Internal Medicine

## 2020-05-08 DIAGNOSIS — E118 Type 2 diabetes mellitus with unspecified complications: Secondary | ICD-10-CM

## 2020-05-12 MED ORDER — SYNJARDY 5-1000 MG PO TABS: 1.0000 | ORAL_TABLET | Freq: Two times a day (BID) | ORAL | 0 refills | Status: DC

## 2020-05-12 NOTE — Telephone Encounter (Signed)
Patients wife scheduled him for 03.09.22 and is wondering if it is okay to go that long without the medication or if we can do a short supply

## 2020-05-12 NOTE — Telephone Encounter (Signed)
Short supply sent

## 2020-05-12 NOTE — Addendum Note (Signed)
Addended by: Hinda Kehr on: 05/12/2020 09:53 AM   Modules accepted: Orders

## 2020-06-02 ENCOUNTER — Encounter: Payer: Self-pay | Admitting: Internal Medicine

## 2020-06-02 ENCOUNTER — Ambulatory Visit (INDEPENDENT_AMBULATORY_CARE_PROVIDER_SITE_OTHER): Payer: PPO | Admitting: Internal Medicine

## 2020-06-02 ENCOUNTER — Other Ambulatory Visit: Payer: Self-pay

## 2020-06-02 VITALS — BP 138/82 | HR 63 | Temp 98.4°F | Resp 16 | Ht 68.0 in | Wt 167.0 lb

## 2020-06-02 DIAGNOSIS — E785 Hyperlipidemia, unspecified: Secondary | ICD-10-CM

## 2020-06-02 DIAGNOSIS — E118 Type 2 diabetes mellitus with unspecified complications: Secondary | ICD-10-CM

## 2020-06-02 DIAGNOSIS — J301 Allergic rhinitis due to pollen: Secondary | ICD-10-CM | POA: Diagnosis not present

## 2020-06-02 DIAGNOSIS — N1831 Chronic kidney disease, stage 3a: Secondary | ICD-10-CM | POA: Diagnosis not present

## 2020-06-02 DIAGNOSIS — I10 Essential (primary) hypertension: Secondary | ICD-10-CM | POA: Diagnosis not present

## 2020-06-02 LAB — HEPATIC FUNCTION PANEL
ALT: 17 U/L (ref 0–53)
AST: 18 U/L (ref 0–37)
Albumin: 4.5 g/dL (ref 3.5–5.2)
Alkaline Phosphatase: 79 U/L (ref 39–117)
Bilirubin, Direct: 0.2 mg/dL (ref 0.0–0.3)
Total Bilirubin: 0.9 mg/dL (ref 0.2–1.2)
Total Protein: 7.2 g/dL (ref 6.0–8.3)

## 2020-06-02 LAB — BASIC METABOLIC PANEL
BUN: 17 mg/dL (ref 6–23)
CO2: 27 mEq/L (ref 19–32)
Calcium: 9.7 mg/dL (ref 8.4–10.5)
Chloride: 105 mEq/L (ref 96–112)
Creatinine, Ser: 1.06 mg/dL (ref 0.40–1.50)
GFR: 68.31 mL/min (ref >60.00)
Glucose, Bld: 109 mg/dL — ABNORMAL HIGH (ref 70–99)
Potassium: 4.6 mEq/L (ref 3.5–5.1)
Sodium: 139 mEq/L (ref 135–145)

## 2020-06-02 LAB — CBC WITH DIFFERENTIAL/PLATELET
Basophils Absolute: 0.1 10*3/uL (ref 0.0–0.1)
Basophils Relative: 0.9 % (ref 0.0–3.0)
Eosinophils Absolute: 0.2 10*3/uL (ref 0.0–0.7)
Eosinophils Relative: 3.8 % (ref 0.0–5.0)
HCT: 48.7 % (ref 39.0–52.0)
Hemoglobin: 16.5 g/dL (ref 13.0–17.0)
Lymphocytes Relative: 21.2 % (ref 12.0–46.0)
Lymphs Abs: 1.4 10*3/uL (ref 0.7–4.0)
MCHC: 33.8 g/dL (ref 30.0–36.0)
MCV: 88.4 fl (ref 78.0–100.0)
Monocytes Absolute: 0.6 10*3/uL (ref 0.1–1.0)
Monocytes Relative: 8.6 % (ref 3.0–12.0)
Neutro Abs: 4.3 10*3/uL (ref 1.4–7.7)
Neutrophils Relative %: 65.5 % (ref 43.0–77.0)
Platelets: 132 10*3/uL — ABNORMAL LOW (ref 150.0–400.0)
RBC: 5.51 Mil/uL (ref 4.22–5.81)
RDW: 13.3 % (ref 11.5–15.5)
WBC: 6.5 10*3/uL (ref 4.0–10.5)

## 2020-06-02 LAB — LIPID PANEL
Cholesterol: 118 mg/dL (ref 0–200)
HDL: 37.6 mg/dL — ABNORMAL LOW (ref >39.00)
LDL Cholesterol: 56 mg/dL (ref 0–99)
NonHDL: 80.8
Total CHOL/HDL Ratio: 3
Triglycerides: 126 mg/dL (ref 0.0–149.0)
VLDL: 25.2 mg/dL (ref 0.0–40.0)

## 2020-06-02 LAB — URINALYSIS, ROUTINE W REFLEX MICROSCOPIC
Bilirubin Urine: NEGATIVE
Hgb urine dipstick: NEGATIVE
Ketones, ur: NEGATIVE
Leukocytes,Ua: NEGATIVE
Nitrite: NEGATIVE
RBC / HPF: NONE SEEN (ref 0–?)
Specific Gravity, Urine: 1.025 (ref 1.000–1.030)
Total Protein, Urine: NEGATIVE
Urine Glucose: >=1000 — AB
Urobilinogen, UA: 0.2 (ref 0.0–1.0)
pH: 6 (ref 5.0–8.0)

## 2020-06-02 LAB — MICROALBUMIN / CREATININE URINE RATIO
Creatinine,U: 80.9 mg/dL
Microalb Creat Ratio: 1.3 mg/g (ref 0.0–30.0)
Microalb, Ur: 1 mg/dL (ref 0.0–1.9)

## 2020-06-02 LAB — TSH: TSH: 2.09 u[IU]/mL (ref 0.35–4.50)

## 2020-06-02 LAB — HEMOGLOBIN A1C: Hgb A1c MFr Bld: 6.2 % (ref 4.6–6.5)

## 2020-06-02 MED ORDER — ROSUVASTATIN CALCIUM 20 MG PO TABS
20.0000 mg | ORAL_TABLET | Freq: Every day | ORAL | 1 refills | Status: DC
Start: 2020-06-02 — End: 2020-12-08

## 2020-06-02 MED ORDER — SYNJARDY 12.5-500 MG PO TABS: 1.0000 | ORAL_TABLET | Freq: Two times a day (BID) | ORAL | 1 refills | Status: DC

## 2020-06-02 MED ORDER — LEVOCETIRIZINE DIHYDROCHLORIDE 5 MG PO TABS: 5.0000 mg | ORAL_TABLET | Freq: Every evening | ORAL | 1 refills | Status: AC

## 2020-06-02 NOTE — Progress Notes (Signed)
Subjective:  Patient ID: Jerry Palmer, male    DOB: Sep 12, 1945  Age: 75 y.o. MRN: [id number]  CC: Hypertension, Diabetes, and Hyperlipidemia  This visit occurred during the SARS-CoV-2 public health emergency.  Safety protocols were in place, including screening questions prior to the visit, additional usage of staff PPE, and extensive cleaning of exam room while observing appropriate contact time as indicated for disinfecting solutions.    HPI Jerry Palmer presents for f/up -  He tells me that his BP and BS have been well controlled. He is active and denies DOE, CP, palpitations, edema or fatigue.  Outpatient Medications Prior to Visit  Medication Sig Dispense Refill  . aspirin EC 81 MG tablet Take 1 tablet (81 mg total) by mouth daily. 90 tablet 3  . Blood Glucose Monitoring Suppl (CONTOUR NEXT MONITOR) w/Device KIT 1 Act by Does not apply route 2 (two) times a day. 2 kit 0  . glucose blood (CONTOUR NEXT TEST) test strip One Act BID 300 each 1  . Empagliflozin-metFORMIN HCl (SYNJARDY) 07-998 MG TABS Take 1 tablet by mouth 2 (two) times daily. MUST KEEP APPOINTMENT FOR ADDITIONAL REFILLS 30 tablet 0  . rosuvastatin (CRESTOR) 20 MG tablet TAKE 1 TABLET BY MOUTH EVERY DAY 90 tablet 1   No facility-administered medications prior to visit.    ROS Review of Systems  Constitutional: Negative for diaphoresis and fatigue.  HENT: Positive for congestion, postnasal drip and rhinorrhea. Negative for facial swelling, nosebleeds, sinus pressure, tinnitus and trouble swallowing.   Respiratory: Negative.  Negative for cough, chest tightness, shortness of breath and wheezing.   Cardiovascular: Negative for chest pain, palpitations and leg swelling.  Gastrointestinal: Negative for abdominal pain, constipation, diarrhea, nausea and vomiting.  Genitourinary: Negative.  Negative for difficulty urinating and dysuria.  Musculoskeletal: Negative.  Negative for arthralgias and myalgias.  Skin:  Negative.   Neurological: Negative.  Negative for dizziness, weakness and light-headedness.  Hematological: Negative.  Negative for adenopathy. Does not bruise/bleed easily.  Psychiatric/Behavioral: Negative.     Objective:  BP 138/82   Pulse 63   Temp 98.4 F (36.9 C) (Oral)   Resp 16   Ht '5\' 8"'  (1.727 m)   Wt 167 lb (75.8 kg)   SpO2 94%   BMI 25.39 kg/m   BP Readings from Last 3 Encounters:  06/02/20 138/82  07/02/19 132/72  10/17/18 124/80    Wt Readings from Last 3 Encounters:  06/02/20 167 lb (75.8 kg)  07/02/19 160 lb 2 oz (72.6 kg)  10/17/18 158 lb 0.6 oz (71.7 kg)    Physical Exam Vitals reviewed.  HENT:     Nose: Nose normal.     Mouth/Throat:     Mouth: Mucous membranes are moist.  Eyes:     General: No scleral icterus.    Conjunctiva/sclera: Conjunctivae normal.  Cardiovascular:     Rate and Rhythm: Normal rate and regular rhythm.     Heart sounds: No murmur heard.   Pulmonary:     Effort: Pulmonary effort is normal.     Breath sounds: No stridor. No wheezing, rhonchi or rales.  Abdominal:     General: Abdomen is protuberant. Bowel sounds are normal. There is no distension.     Palpations: Abdomen is soft. There is no hepatomegaly, splenomegaly or mass.     Tenderness: There is no abdominal tenderness.  Musculoskeletal:        General: Normal range of motion.     Cervical back: Neck supple.  Right lower leg: No edema.     Left lower leg: No edema.  Lymphadenopathy:     Cervical: No cervical adenopathy.  Skin:    General: Skin is warm and dry.     Coloration: Skin is not pale.  Neurological:     General: No focal deficit present.     Mental Status: He is alert.  Psychiatric:        Mood and Affect: Mood normal.     Lab Results  Component Value Date   WBC 6.5 06/02/2020   HGB 16.5 06/02/2020   HCT 48.7 06/02/2020   PLT 132.0 (L) 06/02/2020   GLUCOSE 109 (H) 06/02/2020   CHOL 118 06/02/2020   TRIG 126.0 06/02/2020   HDL 37.60  (L) 06/02/2020   LDLDIRECT 157.4 12/30/2009   LDLCALC 56 06/02/2020   ALT 17 06/02/2020   AST 18 06/02/2020   NA 139 06/02/2020   K 4.6 06/02/2020   CL 105 06/02/2020   CREATININE 1.06 06/02/2020   BUN 17 06/02/2020   CO2 27 06/02/2020   TSH 2.09 06/02/2020   PSA 3.12 10/17/2018   HGBA1C 6.2 06/02/2020   MICROALBUR 1.0 06/02/2020    No results found.  Assessment & Plan:   Jerry Palmer was seen today for hypertension, diabetes and hyperlipidemia.  Diagnoses and all orders for this visit:  Essential hypertension- His BP is well controlled. -     CBC with Differential/Platelet; Future -     Basic metabolic panel; Future -     TSH; Future -     TSH -     Basic metabolic panel -     CBC with Differential/Platelet  Type II diabetes mellitus with manifestations ([place])- His A1C is at 6.2%. Will increase the dose of the SGLT-2 inh for CV benefits. -     Basic metabolic panel; Future -     Hemoglobin A1c; Future -     Microalbumin / creatinine urine ratio; Future -     HM Diabetes Foot Exam -     Microalbumin / creatinine urine ratio -     Hemoglobin A1c -     Basic metabolic panel  Stage 3a chronic kidney disease ([place])- renal fxn is stable. Will maintain control of BP and BS. -     Basic metabolic panel; Future -     Urinalysis, Routine w reflex microscopic; Future -     Urinalysis, Routine w reflex microscopic -     Basic metabolic panel  Dyslipidemia, goal LDL below 70- He has achieved his LDL goal and is doing well on the statin. -     Lipid panel; Future -     Hepatic function panel; Future -     TSH; Future -     rosuvastatin (CRESTOR) 20 MG tablet; Take 1 tablet (20 mg total) by mouth daily. -     TSH -     Hepatic function panel -     Lipid panel  Seasonal allergic rhinitis due to pollen -     levocetirizine (XYZAL) 5 MG tablet; Take 1 tablet (5 mg total) by mouth every evening.   I have discontinued Jerry Palmer's Synjardy. I have also changed his  rosuvastatin. Additionally, I am having him start on levocetirizine and Synjardy. Lastly, I am having him maintain his aspirin EC, Contour Next Monitor, and Contour Next Test.  Meds ordered this encounter  Medications  . rosuvastatin (CRESTOR) 20 MG tablet    Sig: Take 1  tablet (20 mg total) by mouth daily.    Dispense:  90 tablet    Refill:  1  . levocetirizine (XYZAL) 5 MG tablet    Sig: Take 1 tablet (5 mg total) by mouth every evening.    Dispense:  90 tablet    Refill:  1  . Empagliflozin-metFORMIN HCl (SYNJARDY) 12.5-500 MG TABS    Sig: Take 1 tablet by mouth 2 (two) times daily.    Dispense:  180 tablet    Refill:  1     Follow-up: Return in about 3 months (around 09/02/2020).  Scarlette Calico, MD

## 2020-06-02 NOTE — Patient Instructions (Signed)
Type 2 Diabetes Mellitus, Diagnosis, Adult Type 2 diabetes (type 2 diabetes mellitus) is a long-term, or chronic, disease. In type 2 diabetes, one or both of these problems may be present:  The pancreas does not make enough of a hormone called insulin.  Cells in the body do not respond properly to insulin that the body makes (insulin resistance). Normally, insulin allows blood sugar (glucose) to enter cells in the body. The cells use glucose for energy. Insulin resistance or lack of insulin causes excess glucose to build up in the blood instead of going into cells. This causes high blood glucose (hyperglycemia).  What are the causes? The exact cause of type 2 diabetes is not known. What increases the risk? The following factors may make you more likely to develop this condition:  Having a family member with type 2 diabetes.  Being overweight or obese.  Being inactive (sedentary).  Having been diagnosed with insulin resistance.  Having a history of prediabetes, diabetes when you were pregnant (gestational diabetes), or polycystic ovary syndrome (PCOS). What are the signs or symptoms? In the early stage of this condition, you may not have symptoms. Symptoms develop slowly and may include:  Increased thirst or hunger.  Increased urination.  Unexplained weight loss.  Tiredness (fatigue) or weakness.  Vision changes, such as blurry vision.  Dark patches on the skin. How is this diagnosed? This condition is diagnosed based on your symptoms, your medical history, a physical exam, and your blood glucose level. Your blood glucose may be checked with one or more of the following blood tests:  A fasting blood glucose (FBG) test. You will not be allowed to eat (you will fast) for 8 hours or longer before a blood sample is taken.  A random blood glucose test. This test checks blood glucose at any time of day regardless of when you ate.  An A1C (hemoglobin A1C) blood test. This test  provides information about blood glucose levels over the previous 2-3 months.  An oral glucose tolerance test (OGTT). This test measures your blood glucose at two times: ? After fasting. This is your baseline blood glucose level. ? Two hours after drinking a beverage that contains glucose. You may be diagnosed with type 2 diabetes if:  Your fasting blood glucose level is 126 mg/dL (7.0 mmol/L) or higher.  Your random blood glucose level is 200 mg/dL (11.1 mmol/L) or higher.  Your A1C level is 6.5% or higher.  Your oral glucose tolerance test result is higher than 200 mg/dL (11.1 mmol/L). These blood tests may be repeated to confirm your diagnosis.   How is this treated? Your treatment may be managed by a specialist called an endocrinologist. Type 2 diabetes may be treated by following instructions from your health care provider about:  Making dietary and lifestyle changes. These may include: ? Following a personalized nutrition plan that is developed by a registered dietitian. ? Exercising regularly. ? Finding ways to manage stress.  Checking your blood glucose level as often as told.  Taking diabetes medicines or insulin daily. This helps to keep your blood glucose levels in the healthy range.  Taking medicines to help prevent complications from diabetes. Medicines may include: ? Aspirin. ? Medicine to lower cholesterol. ? Medicine to control blood pressure. Your health care provider will set treatment goals for you. Your goals will be based on your age, other medical conditions you have, and how you respond to diabetes treatment. Generally, the goal of treatment is to maintain the   following blood glucose levels:  Before meals: 80-130 mg/dL (4.4-7.2 mmol/L).  After meals: below 180 mg/dL (10 mmol/L).  A1C level: less than 7%. Follow these instructions at home: Questions to ask your health care provider Consider asking the following questions:  Should I meet with a certified  diabetes care and education specialist?  What diabetes medicines do I need, and when should I take them?  What equipment will I need to manage my diabetes at home?  How often do I need to check my blood glucose?  Where can I find a support group for people with diabetes?  What number can I call if I have questions?  When is my next appointment? General instructions  Take over-the-counter and prescription medicines only as told by your health care provider.  Keep all follow-up visits as told by your health care provider. This is important. Where to find more information  American Diabetes Association (ADA): www.diabetes.org  American Association of Diabetes Care and Education Specialists (ADCES): www.diabeteseducator.org  International Diabetes Federation (IDF): www.idf.org Contact a health care provider if:  Your blood glucose is at or above 240 mg/dL (13.3 mmol/L) for 2 days in a row.  You have been sick or have had a fever for 2 days or longer, and you are not getting better.  You have any of the following problems for more than 6 hours: ? You cannot eat or drink. ? You have nausea and vomiting. ? You have diarrhea. Get help right away if:  You have severe hypoglycemia. This means your blood glucose is lower than 54 mg/dL (3.0 mmol/L).  You become confused or you have trouble thinking clearly.  You have difficulty breathing.  You have moderate or large ketone levels in your urine. These symptoms may represent a serious problem that is an emergency. Do not wait to see if the symptoms will go away. Get medical help right away. Call your local emergency services (911 in the [place] Do not drive yourself to the hospital. Summary  Type 2 diabetes (type 2 diabetes mellitus) is a long-term, or chronic, disease. In type 2 diabetes, the pancreas does not make enough of a hormone called insulin, or cells in the body do not respond properly to insulin that the body makes (insulin  resistance).  This condition is treated by making dietary and lifestyle changes and taking diabetes medicines or insulin.  Your health care provider will set treatment goals for you. Your goals will be based on your age, other medical conditions you have, and how you respond to diabetes treatment.  Keep all follow-up visits as told by your health care provider. This is important. This information is not intended to replace advice given to you by your health care provider. Make sure you discuss any questions you have with your health care provider. Document Revised: 10/08/2019 Document Reviewed: 10/08/2019 Elsevier Patient Education  2021 Elsevier Inc.  

## 2020-06-16 ENCOUNTER — Other Ambulatory Visit: Payer: Self-pay

## 2020-06-16 ENCOUNTER — Ambulatory Visit (INDEPENDENT_AMBULATORY_CARE_PROVIDER_SITE_OTHER): Payer: PPO

## 2020-06-16 VITALS — BP 140/78 | HR 71 | Temp 98.3°F | Resp 16 | Ht 68.0 in | Wt 168.0 lb

## 2020-06-16 DIAGNOSIS — Z0000 Encounter for general adult medical examination without abnormal findings: Secondary | ICD-10-CM | POA: Diagnosis not present

## 2020-06-16 NOTE — Progress Notes (Signed)
Subjective:   Jerry Palmer is a 75 y.o. male who presents for Medicare Annual/Subsequent preventive examination.  Review of Systems    No ROS. Medicare Wellness Visit. Additional risk factors are reflected in social history. Cardiac Risk Factors include: advanced age (>58mn, >>93women);diabetes mellitus;hypertension;male gender;dyslipidemia     Objective:    Today's Vitals   06/16/20 1030  BP: 140/78  Pulse: 71  Resp: 16  Temp: 98.3 F (36.8 C)  SpO2: 99%  Weight: 168 lb (76.2 kg)  Height: '5\' 8"'  (1.727 m)  PainSc: 0-No pain   Body mass index is 25.54 kg/m.  Advanced Directives 06/16/2020 12/04/2018 12/02/2014  Does Patient Have a Medical Advance Directive? Yes Yes Yes  Type of AParamedicof AMarionLiving will - -  Does patient want to make changes to medical advance directive? No - Patient declined No - Patient declined -  [place] of [place]? No - copy requested - Yes    Current Medications (verified) Outpatient Encounter Medications as of 06/16/2020  Medication Sig  . aspirin EC 81 MG tablet Take 1 tablet (81 mg total) by mouth daily.  . Blood Glucose Monitoring Suppl (CONTOUR NEXT MONITOR) w/Device KIT 1 Act by Does not apply route 2 (two) times a day.  . Empagliflozin-metFORMIN HCl (SYNJARDY) 12.5-500 MG TABS Take 1 tablet by mouth 2 (two) times daily.  .Marland Kitchenglucose blood (CONTOUR NEXT TEST) test strip One Act BID  . levocetirizine (XYZAL) 5 MG tablet Take 1 tablet (5 mg total) by mouth every evening.  . rosuvastatin (CRESTOR) 20 MG tablet Take 1 tablet (20 mg total) by mouth daily.   No facility-administered encounter medications on file as of 06/16/2020.    Allergies (verified) Angiotensin receptor blockers, Lisinopril, and Bactrim [sulfamethoxazole-trimethoprim]   History: Past Medical History:  Diagnosis Date  . Cancer (HCC)    Basal Cell  . Diverticulosis of colon   . Fasting hyperglycemia   .  Hematuria    PMH of  . Hyperlipidemia   . Hypertension    Past Surgical History:  Procedure Laterality Date  . CATARACT EXTRACTION  04/06/14   right eye  . cataract left eye  1995  . COLONOSCOPY W/ POLYPECTOMY      polyp x 1; negative X 2;Dr PSharlett Iles . cyst scalp  2005   Family History  Problem Relation Age of Onset  . Cancer Brother        ?spinal mets  . Colon cancer Brother 445 . Colon cancer Brother 42 . Colon cancer Brother 482 . Breast cancer Mother   . Hypertension Mother   . Diabetes Sister   . Stroke Maternal Grandfather         > 55  . Heart disease Neg Hx    Social History   Socioeconomic History  . Marital status: Married    Spouse name: Not on file  . Number of children: 0  . Years of education: 8  . Highest education level: Not on file  Occupational History  . Not on file  Tobacco Use  . Smoking status: Former Smoker    Types: Cigarettes    Quit date: 03/27/1978    Years since quitting: 42.2  . Smokeless tobacco: Never Used  . Tobacco comment: smoked 1[place] up to 1.5 ppd   Vaping Use  . Vaping Use: Never used  Substance and Sexual Activity  . Alcohol use: No  Alcohol/week: 0.0 standard drinks  . Drug use: No  . Sexual activity: Not on file  Other Topics Concern  . Not on file  Social History Narrative   Fun: Camping    Social Determinants of Health   Financial Resource Strain: Low Risk   . Difficulty of Paying Living Expenses: Not hard at all  Food Insecurity: No Food Insecurity  . Worried About Charity fundraiser in the Last Year: Never true  . Ran Out of Food in the Last Year: Never true  Transportation Needs: No Transportation Needs  . Lack of Transportation (Medical): No  . Lack of Transportation (Non-Medical): No  Physical Activity: Inactive  . Days of Exercise per Week: 0 days  . Minutes of Exercise per Session: 0 min  Stress: No Stress Concern Present  . Feeling of Stress : Not at all  Social Connections: Socially  Integrated  . Frequency of Communication with Friends and Family: More than three times a week  . Frequency of Social Gatherings with Friends and Family: More than three times a week  . Attends Religious Services: More than 4 times per year  . Active Member of Clubs or Organizations: No  . Attends Archivist Meetings: More than 4 times per year  . Marital Status: Married    Tobacco Counseling Counseling given: Not Answered Comment: smoked [place], up to 1.5 ppd    Clinical Intake:  Pre-visit preparation completed: Yes  Pain : No/denies pain Pain Score: 0-No pain     BMI - recorded: 25.54 Nutritional Status: BMI 25 -29 Overweight Nutritional Risks: None Diabetes: Yes CBG done?: No Did pt. bring in CBG monitor from home?: No  How often do you need to have someone help you when you read instructions, pamphlets, or other written materials from your doctor or pharmacy?: 1 - Never What is the last grade level you completed in school?: 8th grade education  Diabetic? yes  Interpreter Needed?: No  Information entered by :: Lisette Abu, LPN   Activities of Daily Living In your present state of health, do you have any difficulty performing the following activities: 06/16/2020 06/02/2020  Hearing? N N  Vision? N N  Difficulty concentrating or making decisions? N N  Walking or climbing stairs? N N  Dressing or bathing? N N  Doing errands, shopping? N N  Preparing Food and eating ? N -  Using the Toilet? N -  In the past six months, have you accidently leaked urine? N -  Do you have problems with loss of bowel control? N -  Managing your Medications? N -  Managing your Finances? N -  Housekeeping or managing your Housekeeping? N -  Some recent data might be hidden    Patient Care Team: Janith Lima, MD as PCP - General (Internal Medicine)  Indicate any recent Medical Services you may have received from other than [facility] providers in the past year (date  may be approximate).     Assessment:   This is a routine wellness examination for Jerry Palmer.  Hearing/Vision screen No exam data present  Dietary issues and exercise activities discussed: Current Exercise Habits: The patient has a physically strenuous job, but has no regular exercise apart from work., Exercise limited by: None identified  Goals    . Weight < 170 lb (77.111 kg)     Will stop eating ice cream daily; will monitor portions  Controllable  risk for heart disease reviewed for goal setting:  Excess  body fat around the waist Waist circumference women >35 Waist circumference for Men >40  Triglycerides > 150- good HDL < 50- good BP > 130/85 - good Glucose > 100 (is trending up)  Goals are determined by the physician and based on other relevant data and risk Plan Lifestyle changes  BMI reviewed   Risk Calculator: [email or web address]  BP Education: <120/80; any elevation will warrant further checks   Barriers to successful management: None noted  Stress; (1-5) Risk and reduction techniques reviewed   Google pre-diabetes  Fat free or low fat dairy products Fish high in omega-3 acids ( salmon, tuna, trout) Fruits, such as apples, bananas, oranges, pears, prunes Legumes, such as kidney beans, lentils, checkpeas, black-eyed peas and lima beans Vegetables; broccoli, cabbage, carrots Whole grains;   Plant fats are better; decrease "white" foods as pasta, rice, bread and desserts, sugar; Avoid red meat (limiting) palm and coconut oils; sugary foods and beverages  Two nutrients that raise blood chol levels are saturated fats and trans fat; in hydrogenated oils and fats, as stick margarine, baked goods (cookes, cakes, pies, crackers; frosting; and coffee creamers;   Some Fats lower cholesterol: Monounsaturated and polyunsaturated  Avocados Corn, sunflower, and soybean oils Nuts and seeds, such as walnuts Olive, canola, peanut, safflower, and sesame oils Peanut  butter Salmon and trout Tofu        Depression Screen PHQ 2/9 Scores 06/16/2020 06/02/2020 12/04/2018 05/16/2017 10/06/2015 12/02/2014 05/05/2014  PHQ - 2 Score 0 0 0 0 0 0 0    Fall Risk Fall Risk  06/16/2020 07/02/2019 12/04/2018 05/16/2017 10/06/2015  Falls in the past year? 0 0 0 No No  Number falls in past yr: 0 0 - - -  Injury with Fall? 0 0 - - -  Risk for fall due to : No Fall Risks No Fall Risks - - -  Follow up Falls evaluation completed Falls evaluation completed - - -    FALL RISK PREVENTION PERTAINING TO THE HOME:  Any stairs in or around the home? No  If so, are there any without handrails? No  Home free of loose throw rugs in walkways, pet beds, electrical cords, etc? Yes  Adequate lighting in your home to reduce risk of falls? Yes   ASSISTIVE DEVICES UTILIZED TO PREVENT FALLS:  Life alert? No  Use of a cane, walker or w/c? No  Grab bars in the bathroom? No  Shower chair or bench in shower? No  Elevated toilet seat or a handicapped toilet? No   TIMED UP AND GO:  Was the test performed? No .  Length of time to ambulate 10 feet: 0 sec.   Gait steady and fast without use of assistive device  Cognitive Function: Normal cognitive status assessed by direct observation by this Nurse Health Advisor. No abnormalities found.   MMSE - Mini Mental State Exam 12/02/2014  Not completed: (No Data)        Immunizations Immunization History  Administered Date(s) Administered  . Fluad Quad(high Dose 65+) 12/20/2018  . Influenza Split 03/09/2011  . Influenza, High Dose Seasonal PF 01/15/2013, 12/02/2014, 01/05/2016, 01/13/2017, 01/18/2018  . Influenza, Seasonal, Injecte, Preservative Fre 03/12/2012  . Influenza,inj,Quad PF,6+ Mos 01/15/2014  . Moderna Sars-Covid-2 Vaccination 05/02/2019, 05/28/2019  . Pneumococcal Conjugate-13 12/02/2014  . Pneumococcal Polysaccharide-23 01/07/2010, 05/16/2017  . Td 11/19/2008  . Tdap 07/02/2019  . Zoster 03/17/2014    TDAP status: Up to  date  Flu Vaccine status: Up to date  Pneumococcal vaccine status:  Up to date  Covid-19 vaccine status: Completed vaccines  Qualifies for Shingles Vaccine? Yes   Zostavax completed Yes   Shingrix Completed?: No.    Education has been provided regarding the importance of this vaccine. Patient has been advised to call insurance company to determine out of pocket expense if they have not yet received this vaccine. Advised may also receive vaccine at local pharmacy or Health Dept. Verbalized acceptance and understanding.  Screening Tests Health Maintenance  Topic Date Due  . COLONOSCOPY (Pts 45-50yr Insurance coverage will need to be confirmed)  05/07/2018  . INFLUENZA VACCINE  10/26/2019  . COVID-19 Vaccine (3 - Booster for Moderna series) 11/28/2019  . HEMOGLOBIN A1C  12/03/2020  . OPHTHALMOLOGY EXAM  12/16/2020  . FOOT EXAM  06/02/2021  . URINE MICROALBUMIN  06/02/2021  . TETANUS/TDAP  07/01/2029  . PNA vac Low Risk Adult  Completed  . HPV VACCINES  Aged Out    Health Maintenance  Health Maintenance Due  Topic Date Due  . COLONOSCOPY (Pts 45-459yrInsurance coverage will need to be confirmed)  05/07/2018  . INFLUENZA VACCINE  10/26/2019  . COVID-19 Vaccine (3 - Booster for Moderna series) 11/28/2019    Colorectal cancer screening: Type of screening: Colonoscopy. Completed 05/07/2013. Repeat every 5 years  Lung Cancer Screening: (Low Dose CT Chest recommended if Age 77-80ears, 30 pack-year currently smoking OR have quit w/in 15years.) does not qualify.   Lung Cancer Screening Referral: no  Additional Screening:  Hepatitis C Screening: does not qualify; Completed no  Vision Screening: Recommended annual ophthalmology exams for early detection of glaucoma and other disorders of the eye. Is the patient up to date with their annual eye exam?  Yes  Who is the provider or what is the name of the office in which the patient attends annual eye exams? [facility]  is not established with a provider, would they like to be referred to a provider to establish care? No .   Dental Screening: Recommended annual dental exams for proper oral hygiene  Community Resource Referral / Chronic Care Management: CRR required this visit?  No   CCM required this visit?  No      Plan:     I have personally reviewed and noted the following in the patient's chart:   . Medical and social history . Use of alcohol, tobacco or illicit drugs  . Current medications and supplements . Functional ability and status . Nutritional status . Physical activity . Advanced directives . List of other physicians . Hospitalizations, surgeries, and ER visits in previous 12 months . Vitals . Screenings to include cognitive, depression, and falls . Referrals and appointments  In addition, I have reviewed and discussed with patient certain preventive protocols, quality metrics, and best practice recommendations. A written personalized care plan for preventive services as well as general preventive health recommendations were provided to patient.     ShSheral FlowLPN   05/27/90/4462 Nurse Notes:  Medications reviewed with patient; no opioid use noted.

## 2020-06-16 NOTE — Patient Instructions (Addendum)
Jerry Palmer , Thank you for taking time to come for your Medicare Wellness Visit. I appreciate your ongoing commitment to your health goals. Please review the following plan we discussed and let me know if I can assist you in the future.   Screening recommendations/referrals: Colonoscopy: 05/07/2013; due every 5 years Recommended yearly ophthalmology/optometry visit for glaucoma screening and checkup Recommended yearly dental visit for hygiene and checkup  Vaccinations: Influenza vaccine: 11/2019 Pneumococcal vaccine: 12/02/2014, 05/16/2017 Tdap vaccine: 07/02/2019 Shingles vaccine: Zoster 03/10/2014 Covid-19: 05/02/2019, 05/28/2019  Advanced directives: Please bring a copy of your health care power of attorney and living will to the office at your convenience.  Conditions/risks identified: Yes; Reviewed health maintenance screenings with patient today and relevant education, vaccines, and/or referrals were provided. Please continue to do your personal lifestyle choices by: daily care of teeth and gums, regular physical activity (goal should be 5 days a week for 30 minutes), eat a healthy diet, avoid tobacco and drug use, limiting any alcohol intake, taking a low-dose aspirin (if not allergic or have been advised by your provider otherwise) and taking vitamins and minerals as recommended by your provider. Continue doing brain stimulating activities (puzzles, reading, adult coloring books, staying active) to keep memory sharp. Continue to eat heart healthy diet (full of fruits, vegetables, whole grains, lean protein, water--limit salt, fat, and sugar intake) and increase physical activity as tolerated.  Next appointment: Please schedule your next Medicare Wellness Visit with your Nurse Health Advisor in 1 year by calling [phone]. Preventive Care 90 Years and Older, Male Preventive care refers to lifestyle choices and visits with your health care provider that can promote health and wellness. What does  preventive care include?  A yearly physical exam. This is also called an annual well check.  Dental exams once or twice a year.  Routine eye exams. Ask your health care provider how often you should have your eyes checked.  Personal lifestyle choices, including:  Daily care of your teeth and gums.  Regular physical activity.  Eating a healthy diet.  Avoiding tobacco and drug use.  Limiting alcohol use.  Practicing safe sex.  Taking low doses of aspirin every day.  Taking vitamin and mineral supplements as recommended by your health care provider. What happens during an annual well check? The services and screenings done by your health care provider during your annual well check will depend on your age, overall health, lifestyle risk factors, and family history of disease. Counseling  Your health care provider may ask you questions about your:  Alcohol use.  Tobacco use.  Drug use.  Emotional well-being.  Home and relationship well-being.  Sexual activity.  Eating habits.  History of falls.  Memory and ability to understand (cognition).  Work and work Statistician. Screening  You may have the following tests or measurements:  Height, weight, and BMI.  Blood pressure.  Lipid and cholesterol levels. These may be checked every 5 years, or more frequently if you are over 26 years old.  Skin check.  Lung cancer screening. You may have this screening every year starting at age 55 if you have a 30-pack-year history of smoking and currently smoke or have quit within the past 15 years.  Fecal occult blood test (FOBT) of the stool. You may have this test every year starting at age 61.  Flexible sigmoidoscopy or colonoscopy. You may have a sigmoidoscopy every 5 years or a colonoscopy every 10 years starting at age 35.  Prostate cancer screening. Recommendations  will vary depending on your family history and other risks.  Hepatitis C blood test.  Hepatitis B  blood test.  Sexually transmitted disease (STD) testing.  Diabetes screening. This is done by checking your blood sugar (glucose) after you have not eaten for a while (fasting). You may have this done every 1-3 years.  Abdominal aortic aneurysm (AAA) screening. You may need this if you are a current or former smoker.  Osteoporosis. You may be screened starting at age 69 if you are at high risk. Talk with your health care provider about your test results, treatment options, and if necessary, the need for more tests. Vaccines  Your health care provider may recommend certain vaccines, such as:  Influenza vaccine. This is recommended every year.  Tetanus, diphtheria, and acellular pertussis (Tdap, Td) vaccine. You may need a Td booster every 10 years.  Zoster vaccine. You may need this after age 33.  Pneumococcal 13-valent conjugate (PCV13) vaccine. One dose is recommended after age 16.  Pneumococcal polysaccharide (PPSV23) vaccine. One dose is recommended after age 27. Talk to your health care provider about which screenings and vaccines you need and how often you need them. This information is not intended to replace advice given to you by your health care provider. Make sure you discuss any questions you have with your health care provider. Document Released: 04/09/2015 Document Revised: 12/01/2015 Document Reviewed: 01/12/2015 Elsevier Interactive Patient Education  2017 [place] Prevention in the Home Falls can cause injuries. They can happen to people of all ages. There are many things you can do to make your home safe and to help prevent falls. What can I do on the outside of my home?  Regularly fix the edges of walkways and driveways and fix any cracks.  Remove anything that might make you trip as you walk through a door, such as a raised step or threshold.  Trim any bushes or trees on the path to your home.  Use bright outdoor lighting.  Clear any walking paths  of anything that might make someone trip, such as rocks or tools.  Regularly check to see if handrails are loose or broken. Make sure that both sides of any steps have handrails.  Any raised decks and porches should have guardrails on the edges.  Have any leaves, snow, or ice cleared regularly.  Use sand or salt on walking paths during winter.  Clean up any spills in your garage right away. This includes oil or grease spills. What can I do in the bathroom?  Use night lights.  Install grab bars by the toilet and in the tub and shower. Do not use towel bars as grab bars.  Use non-skid mats or decals in the tub or shower.  If you need to sit down in the shower, use a plastic, non-slip stool.  Keep the floor dry. Clean up any water that spills on the floor as soon as it happens.  Remove soap buildup in the tub or shower regularly.  Attach bath mats securely with double-sided non-slip rug tape.  Do not have throw rugs and other things on the floor that can make you trip. What can I do in the bedroom?  Use night lights.  Make sure that you have a light by your bed that is easy to reach.  Do not use any sheets or blankets that are too big for your bed. They should not hang down onto the floor.  Have a firm chair that  has side arms. You can use this for support while you get dressed.  Do not have throw rugs and other things on the floor that can make you trip. What can I do in the kitchen?  Clean up any spills right away.  Avoid walking on wet floors.  Keep items that you use a lot in easy-to-reach places.  If you need to reach something above you, use a strong step stool that has a grab bar.  Keep electrical cords out of the way.  Do not use floor polish or wax that makes floors slippery. If you must use wax, use non-skid floor wax.  Do not have throw rugs and other things on the floor that can make you trip. What can I do with my stairs?  Do not leave any items on  the stairs.  Make sure that there are handrails on both sides of the stairs and use them. Fix handrails that are broken or loose. Make sure that handrails are as long as the stairways.  Check any carpeting to make sure that it is firmly attached to the stairs. Fix any carpet that is loose or worn.  Avoid having throw rugs at the top or bottom of the stairs. If you do have throw rugs, attach them to the floor with carpet tape.  Make sure that you have a light switch at the top of the stairs and the bottom of the stairs. If you do not have them, ask someone to add them for you. What else can I do to help prevent falls?  Wear shoes that:  Do not have high heels.  Have rubber bottoms.  Are comfortable and fit you well.  Are closed at the toe. Do not wear sandals.  If you use a stepladder:  Make sure that it is fully opened. Do not climb a closed stepladder.  Make sure that both sides of the stepladder are locked into place.  Ask someone to hold it for you, if possible.  Clearly mark and make sure that you can see:  Any grab bars or handrails.  First and last steps.  Where the edge of each step is.  Use tools that help you move around (mobility aids) if they are needed. These include:  Canes.  Walkers.  Scooters.  Crutches.  Turn on the lights when you go into a dark area. Replace any light bulbs as soon as they burn out.  Set up your furniture so you have a clear path. Avoid moving your furniture around.  If any of your floors are uneven, fix them.  If there are any pets around you, be aware of where they are.  Review your medicines with your doctor. Some medicines can make you feel dizzy. This can increase your chance of falling. Ask your doctor what other things that you can do to help prevent falls. This information is not intended to replace advice given to you by your health care provider. Make sure you discuss any questions you have with your health care  provider. Document Released: 01/07/2009 Document Revised: 08/19/2015 Document Reviewed: 04/17/2014 Elsevier Interactive Patient Education  2017 [employer].

## 2020-08-28 ENCOUNTER — Other Ambulatory Visit: Payer: Self-pay | Admitting: Internal Medicine

## 2020-08-28 DIAGNOSIS — E118 Type 2 diabetes mellitus with unspecified complications: Secondary | ICD-10-CM

## 2020-10-14 ENCOUNTER — Telehealth: Payer: Self-pay

## 2020-10-14 NOTE — Telephone Encounter (Addendum)
Pt in office with wife during nurse visit.   Laryngitis x 85months, no pain noted.  Would like to make appt to see PCP.  Appt made for PCP 1st available on Weds Aug 17.  Pt verb understanding, appt card given.

## 2020-11-10 ENCOUNTER — Ambulatory Visit (INDEPENDENT_AMBULATORY_CARE_PROVIDER_SITE_OTHER): Payer: PPO

## 2020-11-10 ENCOUNTER — Other Ambulatory Visit: Payer: Self-pay

## 2020-11-10 ENCOUNTER — Encounter: Payer: Self-pay | Admitting: Internal Medicine

## 2020-11-10 ENCOUNTER — Ambulatory Visit (INDEPENDENT_AMBULATORY_CARE_PROVIDER_SITE_OTHER): Payer: PPO | Admitting: Internal Medicine

## 2020-11-10 VITALS — BP 146/86 | HR 73 | Temp 98.3°F | Ht 68.0 in | Wt 162.0 lb

## 2020-11-10 DIAGNOSIS — J370 Chronic laryngitis: Secondary | ICD-10-CM

## 2020-11-10 DIAGNOSIS — R059 Cough, unspecified: Secondary | ICD-10-CM

## 2020-11-10 DIAGNOSIS — N1831 Chronic kidney disease, stage 3a: Secondary | ICD-10-CM | POA: Diagnosis not present

## 2020-11-10 DIAGNOSIS — I10 Essential (primary) hypertension: Secondary | ICD-10-CM

## 2020-11-10 DIAGNOSIS — Z0001 Encounter for general adult medical examination with abnormal findings: Secondary | ICD-10-CM

## 2020-11-10 DIAGNOSIS — E118 Type 2 diabetes mellitus with unspecified complications: Secondary | ICD-10-CM | POA: Diagnosis not present

## 2020-11-10 DIAGNOSIS — D696 Thrombocytopenia, unspecified: Secondary | ICD-10-CM | POA: Diagnosis not present

## 2020-11-10 DIAGNOSIS — Z125 Encounter for screening for malignant neoplasm of prostate: Secondary | ICD-10-CM | POA: Diagnosis not present

## 2020-11-10 LAB — BASIC METABOLIC PANEL
BUN: 17 mg/dL (ref 6–23)
CO2: 28 mEq/L (ref 19–32)
Calcium: 10.2 mg/dL (ref 8.4–10.5)
Chloride: 106 mEq/L (ref 96–112)
Creatinine, Ser: 1.11 mg/dL (ref 0.40–1.50)
GFR: 64.43 mL/min (ref >60.00)
Glucose, Bld: 91 mg/dL (ref 70–99)
Potassium: 4.8 mEq/L (ref 3.5–5.1)
Sodium: 141 mEq/L (ref 135–145)

## 2020-11-10 LAB — HEMOGLOBIN A1C: Hgb A1c MFr Bld: 6.5 % (ref 4.6–6.5)

## 2020-11-10 LAB — CBC WITH DIFFERENTIAL/PLATELET
Basophils Absolute: 0.1 10*3/uL (ref 0.0–0.1)
Basophils Relative: 0.8 % (ref 0.0–3.0)
Eosinophils Absolute: 0.3 10*3/uL (ref 0.0–0.7)
Eosinophils Relative: 4.2 % (ref 0.0–5.0)
HCT: 48.2 % (ref 39.0–52.0)
Hemoglobin: 16.3 g/dL (ref 13.0–17.0)
Lymphocytes Relative: 19.4 % (ref 12.0–46.0)
Lymphs Abs: 1.3 10*3/uL (ref 0.7–4.0)
MCHC: 33.8 g/dL (ref 30.0–36.0)
MCV: 89 fl (ref 78.0–100.0)
Monocytes Absolute: 0.6 10*3/uL (ref 0.1–1.0)
Monocytes Relative: 9.6 % (ref 3.0–12.0)
Neutro Abs: 4.4 10*3/uL (ref 1.4–7.7)
Neutrophils Relative %: 66 % (ref 43.0–77.0)
Platelets: 134 10*3/uL — ABNORMAL LOW (ref 150.0–400.0)
RBC: 5.41 Mil/uL (ref 4.22–5.81)
RDW: 13.3 % (ref 11.5–15.5)
WBC: 6.7 10*3/uL (ref 4.0–10.5)

## 2020-11-10 LAB — PSA: PSA: 2.53 ng/mL (ref 0.10–4.00)

## 2020-11-10 LAB — FOLATE: Folate: 17.5 ng/mL (ref >5.9)

## 2020-11-10 LAB — VITAMIN B12: Vitamin B-12: 334 pg/mL (ref 211–911)

## 2020-11-10 NOTE — Patient Instructions (Signed)
Health Maintenance, Male Adopting a healthy lifestyle and getting preventive care are important in promoting health and wellness. Ask your health care provider about: The right schedule for you to have regular tests and exams. Things you can do on your own to prevent diseases and keep yourself healthy. What should I know about diet, weight, and exercise? Eat a healthy diet  Eat a diet that includes plenty of vegetables, fruits, low-fat dairy products, and lean protein. Do not eat a lot of foods that are high in solid fats, added sugars, or sodium.  Maintain a healthy weight Body mass index (BMI) is a measurement that can be used to identify possible weight problems. It estimates body fat based on height and weight. Your health care provider can help determine your BMI and help you achieve or maintain ahealthy weight. Get regular exercise Get regular exercise. This is one of the most important things you can do for your health. Most adults should: Exercise for at least 150 minutes each week. The exercise should increase your heart rate and make you sweat (moderate-intensity exercise). Do strengthening exercises at least twice a week. This is in addition to the moderate-intensity exercise. Spend less time sitting. Even light physical activity can be beneficial. Watch cholesterol and blood lipids Have your blood tested for lipids and cholesterol at 77 years of age, then havethis test every 5 years. You may need to have your cholesterol levels checked more often if: Your lipid or cholesterol levels are high. You are older than 77 years of age. You are at high risk for heart disease. What should I know about cancer screening? Many types of cancers can be detected early and may often be prevented. Depending on your health history and family history, you may need to have cancer screening at various ages. This may include screening for: Colorectal cancer. Prostate cancer. Skin cancer. Lung  cancer. What should I know about heart disease, diabetes, and high blood pressure? Blood pressure and heart disease High blood pressure causes heart disease and increases the risk of stroke. This is more likely to develop in people who have high blood pressure readings, are of African descent, or are overweight. Talk with your health care provider about your target blood pressure readings. Have your blood pressure checked: Every 3-5 years if you are 18-39 years of age. Every year if you are 40 years old or older. If you are between the ages of 65 and 75 and are a current or former smoker, ask your health care provider if you should have a one-time screening for abdominal aortic aneurysm (AAA). Diabetes Have regular diabetes screenings. This checks your fasting blood sugar level. Have the screening done: Once every three years after age 45 if you are at a normal weight and have a low risk for diabetes. More often and at a younger age if you are overweight or have a high risk for diabetes. What should I know about preventing infection? Hepatitis B If you have a higher risk for hepatitis B, you should be screened for this virus. Talk with your health care provider to find out if you are at risk forhepatitis B infection. Hepatitis C Blood testing is recommended for: Everyone born from 1945 through 1965. Anyone with known risk factors for hepatitis C. Sexually transmitted infections (STIs) You should be screened each year for STIs, including gonorrhea and chlamydia, if: You are sexually active and are younger than 77 years of age. You are older than 77 years of age   and your health care provider tells you that you are at risk for this type of infection. Your sexual activity has changed since you were last screened, and you are at increased risk for chlamydia or gonorrhea. Ask your health care provider if you are at risk. Ask your health care provider about whether you are at high risk for HIV.  Your health care provider may recommend a prescription medicine to help prevent HIV infection. If you choose to take medicine to prevent HIV, you should first get tested for HIV. You should then be tested every 3 months for as long as you are taking the medicine. Follow these instructions at home: Lifestyle Do not use any products that contain nicotine or tobacco, such as cigarettes, e-cigarettes, and chewing tobacco. If you need help quitting, ask your health care provider. Do not use street drugs. Do not share needles. Ask your health care provider for help if you need support or information about quitting drugs. Alcohol use Do not drink alcohol if your health care provider tells you not to drink. If you drink alcohol: Limit how much you have to 0-2 drinks a day. Be aware of how much alcohol is in your drink. In the [place] one drink equals one 12 oz bottle of beer (355 mL), one 5 oz glass of wine (148 mL), or one 1 oz glass of hard liquor (44 mL). General instructions Schedule regular health, dental, and eye exams. Stay current with your vaccines. Tell your health care provider if: You often feel depressed. You have ever been abused or do not feel safe at home. Summary Adopting a healthy lifestyle and getting preventive care are important in promoting health and wellness. Follow your health care provider's instructions about healthy diet, exercising, and getting tested or screened for diseases. Follow your health care provider's instructions on monitoring your cholesterol and blood pressure. This information is not intended to replace advice given to you by your health care provider. Make sure you discuss any questions you have with your healthcare provider. Document Revised: 03/06/2018 Document Reviewed: 03/06/2018 Elsevier Patient Education  2022 Elsevier Inc.  

## 2020-11-10 NOTE — Progress Notes (Signed)
cough  Subjective:  Patient ID: Jerry Palmer, male    DOB: December 07, 1945  Age: 75 y.o. MRN: [id number]  CC: Annual Exam  This visit occurred during the SARS-CoV-2 public health emergency.  Safety protocols were in place, including screening questions prior to the visit, additional usage of staff PPE, and extensive cleaning of exam room while observing appropriate contact time as indicated for disinfecting solutions.    HPI [name] presents for a CPX and f/up -   He complains of a 12-monthhistory of laryngitis with nonproductive cough and postnasal drip.  He is active and denies chest pain, shortness of breath, diaphoresis, dizziness, or lightheadedness.  Outpatient Medications Prior to Visit  Medication Sig Dispense Refill   aspirin EC 81 MG tablet Take 1 tablet (81 mg total) by mouth daily. 90 tablet 3   Blood Glucose Monitoring Suppl (CONTOUR NEXT MONITOR) w/Device KIT 1 Act by Does not apply route 2 (two) times a day. 2 kit 0   Empagliflozin-metFORMIN HCl (SYNJARDY) 12.5-500 MG TABS Take 1 tablet by mouth 2 (two) times daily. 180 tablet 1   glucose blood (CONTOUR NEXT TEST) test strip USE TO CHECK SUGAR TWICE A DAY. WHAT METER HAVE 200 strip 2   levocetirizine (XYZAL) 5 MG tablet Take 1 tablet (5 mg total) by mouth every evening. 90 tablet 1   rosuvastatin (CRESTOR) 20 MG tablet Take 1 tablet (20 mg total) by mouth daily. 90 tablet 1   No facility-administered medications prior to visit.    ROS Review of Systems  Constitutional:  Negative for appetite change, diaphoresis, fatigue, fever and unexpected weight change.  HENT:  Positive for postnasal drip. Negative for facial swelling, nosebleeds, rhinorrhea, sinus pressure, sneezing, sore throat, tinnitus, trouble swallowing and voice change.   Respiratory:  Positive for cough. Negative for chest tightness, shortness of breath and wheezing.   Cardiovascular:  Negative for chest pain, palpitations and leg swelling.   Gastrointestinal:  Negative for abdominal pain, constipation, diarrhea and nausea.  Genitourinary: Negative.  Negative for difficulty urinating, enuresis and hematuria.  Musculoskeletal: Negative.  Negative for arthralgias and myalgias.  Skin: Negative.  Negative for color change and pallor.  Neurological: Negative.  Negative for dizziness, weakness, light-headedness and headaches.  Hematological:  Negative for adenopathy. Does not bruise/bleed easily.  Psychiatric/Behavioral: Negative.     Objective:  BP (!) 146/86 (BP Location: Right Arm, Patient Position: Sitting, Cuff Size: Large)   Pulse 73   Temp 98.3 F (36.8 C) (Oral)   Ht _0  (1.727 m)   Wt 162 lb (73.5 kg)   SpO2 98%   BMI 24.63 kg/m   BP Readings from Last 3 Encounters:  11/10/20 (!) 146/86  06/16/20 140/78  06/02/20 138/82    Wt Readings from Last 3 Encounters:  11/10/20 162 lb (73.5 kg)  06/16/20 168 lb (76.2 kg)  06/02/20 167 lb (75.8 kg)    Physical Exam Vitals reviewed.  Constitutional:      Appearance: Normal appearance.  HENT:     Nose: Nose normal.     Mouth/Throat:     Mouth: Mucous membranes are moist.  Eyes:     Conjunctiva/sclera: Conjunctivae normal.  Cardiovascular:     Rate and Rhythm: Normal rate and regular rhythm.     Heart sounds: No murmur heard. Pulmonary:     Effort: Pulmonary effort is normal. No respiratory distress.     Breath sounds: No stridor. No wheezing, rhonchi or rales.  Abdominal:  General: Abdomen is flat. There is no distension.     Palpations: Abdomen is soft. There is no hepatomegaly, splenomegaly or mass.     Tenderness: There is no abdominal tenderness. There is no guarding.  Musculoskeletal:        General: Normal range of motion.     Cervical back: Neck supple. No tenderness.     Right lower leg: No edema.     Left lower leg: No edema.  Lymphadenopathy:     Cervical: No cervical adenopathy.  Skin:    General: Skin is warm and dry.  Neurological:      General: No focal deficit present.     Mental Status: He is alert.  Psychiatric:        Mood and Affect: Mood normal.        Behavior: Behavior normal.    Lab Results  Component Value Date   WBC 6.7 11/10/2020   HGB 16.3 11/10/2020   HCT 48.2 11/10/2020   PLT 134.0 (L) 11/10/2020   GLUCOSE 91 11/10/2020   CHOL 118 06/02/2020   TRIG 126.0 06/02/2020   HDL 37.60 (L) 06/02/2020   LDLDIRECT 157.4 12/30/2009   LDLCALC 56 06/02/2020   ALT 17 06/02/2020   AST 18 06/02/2020   NA 141 11/10/2020   K 4.8 11/10/2020   CL 106 11/10/2020   CREATININE 1.11 11/10/2020   BUN 17 11/10/2020   CO2 28 11/10/2020   TSH 2.09 06/02/2020   PSA 2.53 11/10/2020   HGBA1C 6.5 11/10/2020   MICROALBUR 1.0 06/02/2020    DG Chest 2 View  Result Date: 11/11/2020 CLINICAL DATA:  Cough. EXAM: CHEST - 2 VIEW COMPARISON:  July 17, 2005. FINDINGS: The heart size and mediastinal contours are within normal limits. Both lungs are clear. The visualized skeletal structures are unremarkable. IMPRESSION: No active cardiopulmonary disease. Electronically Signed   By: Marijo Conception M.D.   On: 11/11/2020 08:39     Assessment & Plan:   Coran was seen today for annual exam.  Diagnoses and all orders for this visit:  Thrombocytopenia ([place])- His platelet count is stable.  There is no history of bleeding or bruising.  B12 and folate are normal.  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 -     CBC with Differential/Platelet; Future -     Vitamin B12; Future -     Folate; Future -     Folate -     Vitamin B12 -     CBC with Differential/Platelet  Essential hypertension- His blood pressure is adequately well controlled. -     Basic metabolic panel; Future -     Basic metabolic panel  Type II diabetes mellitus with manifestations ([place])- His blood sugar is well controlled. -     Basic metabolic panel; Future -     Hemoglobin A1c; Future -     Hemoglobin A1c -     Basic metabolic panel  Cough -     DG Chest 2 View;  Future  Laryngitis, chronic- His exam and chest x-ray are reassuring.  I recommended that he see ENT to be evaluated for laryngeal disorders. -     DG Chest 2 View; Future -     Ambulatory referral to ENT  Stage 3a chronic kidney disease ([place])- He will avoid nephrotoxic agents.  Will continue to address risk factor modifications.  Encounter for general adult medical examination with abnormal findings- Exam completed, labs reviewed, vaccines are up-to-date, cancer screenings are up-to-date, patient  education was given.  Prostate cancer screening -     PSA; Future -     PSA  I am having Tyberius L. Deguzman maintain his aspirin EC, Contour Next Monitor, rosuvastatin, levocetirizine, Synjardy, and Contour Next Test.  No orders of the defined types were placed in this encounter.    Follow-up: Return in about 3 months (around 02/10/2021).  Scarlette Calico, MD

## 2020-11-12 ENCOUNTER — Encounter: Payer: Self-pay | Admitting: Internal Medicine

## 2020-12-08 ENCOUNTER — Other Ambulatory Visit: Payer: Self-pay | Admitting: Internal Medicine

## 2020-12-08 DIAGNOSIS — E785 Hyperlipidemia, unspecified: Secondary | ICD-10-CM

## 2020-12-15 DIAGNOSIS — J029 Acute pharyngitis, unspecified: Secondary | ICD-10-CM | POA: Diagnosis not present

## 2021-01-06 ENCOUNTER — Other Ambulatory Visit: Payer: Self-pay | Admitting: Otolaryngology

## 2021-01-06 DIAGNOSIS — H6123 Impacted cerumen, bilateral: Secondary | ICD-10-CM | POA: Diagnosis not present

## 2021-01-06 DIAGNOSIS — R490 Dysphonia: Secondary | ICD-10-CM | POA: Diagnosis not present

## 2021-01-06 DIAGNOSIS — D380 Neoplasm of uncertain behavior of larynx: Secondary | ICD-10-CM | POA: Diagnosis not present

## 2021-01-07 ENCOUNTER — Encounter (HOSPITAL_BASED_OUTPATIENT_CLINIC_OR_DEPARTMENT_OTHER): Payer: Self-pay | Admitting: Otolaryngology

## 2021-01-07 ENCOUNTER — Other Ambulatory Visit: Payer: Self-pay

## 2021-01-11 ENCOUNTER — Encounter (HOSPITAL_BASED_OUTPATIENT_CLINIC_OR_DEPARTMENT_OTHER)
Admission: RE | Admit: 2021-01-11 | Discharge: 2021-01-11 | Disposition: A | Discharge status: Home / Self Care | Payer: PPO | Source: Ambulatory Visit | Attending: Otolaryngology | Admitting: Otolaryngology

## 2021-01-11 DIAGNOSIS — Z87891 Personal history of nicotine dependence: Secondary | ICD-10-CM | POA: Diagnosis not present

## 2021-01-11 DIAGNOSIS — I10 Essential (primary) hypertension: Secondary | ICD-10-CM | POA: Diagnosis not present

## 2021-01-11 DIAGNOSIS — Z01812 Encounter for preprocedural laboratory examination: Secondary | ICD-10-CM | POA: Insufficient documentation

## 2021-01-11 DIAGNOSIS — J387 Other diseases of larynx: Secondary | ICD-10-CM | POA: Diagnosis present

## 2021-01-11 DIAGNOSIS — H6123 Impacted cerumen, bilateral: Secondary | ICD-10-CM | POA: Diagnosis not present

## 2021-01-11 DIAGNOSIS — C320 Malignant neoplasm of glottis: Secondary | ICD-10-CM | POA: Diagnosis not present

## 2021-01-11 DIAGNOSIS — Z833 Family history of diabetes mellitus: Secondary | ICD-10-CM | POA: Diagnosis not present

## 2021-01-11 DIAGNOSIS — E1136 Type 2 diabetes mellitus with diabetic cataract: Secondary | ICD-10-CM | POA: Diagnosis not present

## 2021-01-11 DIAGNOSIS — R490 Dysphonia: Secondary | ICD-10-CM | POA: Diagnosis not present

## 2021-01-11 LAB — BASIC METABOLIC PANEL
Anion gap: 7 (ref 5–15)
BUN: 14 mg/dL (ref 8–23)
CO2: 27 mmol/L (ref 22–32)
Calcium: 9.5 mg/dL (ref 8.9–10.3)
Chloride: 106 mmol/L (ref 98–111)
Creatinine, Ser: 0.89 mg/dL (ref 0.61–1.24)
GFR, Estimated: >60 mL/min (ref >60)
Glucose, Bld: 95 mg/dL (ref 70–99)
Potassium: 4.3 mmol/L (ref 3.5–5.1)
Sodium: 140 mmol/L (ref 135–145)

## 2021-01-13 NOTE — Anesthesia Preprocedure Evaluation (Addendum)
Anesthesia Evaluation  Patient identified by MRN, date of birth, ID band Patient awake    Reviewed: Allergy & Precautions, NPO status , Patient's Chart, lab work & pertinent test results  Airway Mallampati: II  TM Distance: >3 FB Neck ROM: Full    Dental  (+) Teeth Intact   Pulmonary neg pulmonary ROS, former smoker,    Pulmonary exam normal        Cardiovascular hypertension,  Rhythm:Regular Rate:Normal     Neuro/Psych negative neurological ROS  negative psych ROS   GI/Hepatic negative GI ROS, Neg liver ROS,   Endo/Other  diabetes  Renal/GU CRFRenal disease  negative genitourinary   Musculoskeletal negative musculoskeletal ROS (+)   Abdominal (+)  Abdomen: soft.    Peds  Hematology negative hematology ROS (+)   Anesthesia Other Findings VC lesion  Reproductive/Obstetrics                            Anesthesia Physical Anesthesia Plan  ASA: 2  Anesthesia Plan: General   Post-op Pain Management:    Induction: Intravenous  PONV Risk Score and Plan: 2 and Ondansetron, Dexamethasone and Treatment may vary due to age or medical condition  Airway Management Planned: Mask and Oral ETT  Additional Equipment: None  Intra-op Plan:   Post-operative Plan: Extubation in OR  Informed Consent: I have reviewed the patients History and Physical, chart, labs and discussed the procedure including the risks, benefits and alternatives for the proposed anesthesia with the patient or authorized representative who has indicated his/her understanding and acceptance.     Dental advisory given  Plan Discussed with: CRNA  Anesthesia Plan Comments: (Lab Results      Component                Value               Date                      WBC                      6.7                 11/10/2020                HGB                      16.3                11/10/2020                HCT                       48.2                11/10/2020                MCV                      89.0                11/10/2020                PLT                      134.0 (L)  11/10/2020           Lab Results      Component                Value               Date                      NA                       140                 01/11/2021                K                        4.3                 01/11/2021                CO2                      27                  01/11/2021                GLUCOSE                  95                  01/11/2021                BUN                      14                  01/11/2021                CREATININE               0.89                01/11/2021                CALCIUM                  9.5                 01/11/2021                GFRNONAA                 >60                 01/11/2021          )       Anesthesia Quick Evaluation

## 2021-01-14 ENCOUNTER — Ambulatory Visit (HOSPITAL_BASED_OUTPATIENT_CLINIC_OR_DEPARTMENT_OTHER): Payer: PPO | Admitting: Anesthesiology

## 2021-01-14 ENCOUNTER — Encounter (HOSPITAL_BASED_OUTPATIENT_CLINIC_OR_DEPARTMENT_OTHER)
Admission: RE | Disposition: A | Discharge status: Home / Self Care | Payer: Self-pay | Source: Home / Self Care | Attending: Otolaryngology

## 2021-01-14 ENCOUNTER — Other Ambulatory Visit: Payer: Self-pay | Admitting: Otolaryngology

## 2021-01-14 ENCOUNTER — Other Ambulatory Visit: Payer: Self-pay

## 2021-01-14 ENCOUNTER — Encounter (HOSPITAL_BASED_OUTPATIENT_CLINIC_OR_DEPARTMENT_OTHER): Payer: Self-pay | Admitting: Otolaryngology

## 2021-01-14 ENCOUNTER — Ambulatory Visit (HOSPITAL_BASED_OUTPATIENT_CLINIC_OR_DEPARTMENT_OTHER)
Admission: RE | Admit: 2021-01-14 | Discharge: 2021-01-14 | Disposition: A | Discharge status: Home / Self Care | Payer: PPO | Attending: Otolaryngology | Admitting: Otolaryngology

## 2021-01-14 DIAGNOSIS — E785 Hyperlipidemia, unspecified: Secondary | ICD-10-CM | POA: Diagnosis not present

## 2021-01-14 DIAGNOSIS — Z87891 Personal history of nicotine dependence: Secondary | ICD-10-CM | POA: Insufficient documentation

## 2021-01-14 DIAGNOSIS — E1136 Type 2 diabetes mellitus with diabetic cataract: Secondary | ICD-10-CM | POA: Insufficient documentation

## 2021-01-14 DIAGNOSIS — Z833 Family history of diabetes mellitus: Secondary | ICD-10-CM | POA: Insufficient documentation

## 2021-01-14 DIAGNOSIS — D380 Neoplasm of uncertain behavior of larynx: Secondary | ICD-10-CM | POA: Diagnosis not present

## 2021-01-14 DIAGNOSIS — C320 Malignant neoplasm of glottis: Secondary | ICD-10-CM | POA: Diagnosis not present

## 2021-01-14 DIAGNOSIS — I10 Essential (primary) hypertension: Secondary | ICD-10-CM | POA: Diagnosis not present

## 2021-01-14 DIAGNOSIS — J302 Other seasonal allergic rhinitis: Secondary | ICD-10-CM | POA: Diagnosis not present

## 2021-01-14 DIAGNOSIS — R490 Dysphonia: Secondary | ICD-10-CM | POA: Insufficient documentation

## 2021-01-14 DIAGNOSIS — J383 Other diseases of vocal cords: Secondary | ICD-10-CM

## 2021-01-14 DIAGNOSIS — C329 Malignant neoplasm of larynx, unspecified: Secondary | ICD-10-CM | POA: Diagnosis not present

## 2021-01-14 DIAGNOSIS — I493 Ventricular premature depolarization: Secondary | ICD-10-CM | POA: Diagnosis not present

## 2021-01-14 DIAGNOSIS — H6123 Impacted cerumen, bilateral: Secondary | ICD-10-CM | POA: Diagnosis not present

## 2021-01-14 HISTORY — DX: Chronic kidney disease, unspecified: N18.9

## 2021-01-14 HISTORY — DX: Type 2 diabetes mellitus without complications: E11.9

## 2021-01-14 HISTORY — PX: MICROLARYNGOSCOPY: SHX5208

## 2021-01-14 HISTORY — DX: Other diseases of vocal cords: J38.3

## 2021-01-14 LAB — GLUCOSE, CAPILLARY
Glucose-Capillary: 116 mg/dL — ABNORMAL HIGH (ref 70–99)
Glucose-Capillary: 123 mg/dL — ABNORMAL HIGH (ref 70–99)

## 2021-01-14 SURGERY — MICROLARYNGOSCOPY
Anesthesia: General | Site: Throat

## 2021-01-14 MED ORDER — EPINEPHRINE PF 1 MG/ML IJ SOLN
INTRAMUSCULAR | Status: AC
  Filled 2021-01-14: qty 1

## 2021-01-14 MED ORDER — PHENYLEPHRINE HCL (PRESSORS) 10 MG/ML IV SOLN
INTRAVENOUS | Status: DC | PRN
Start: 2021-01-14 — End: 2021-01-14
  Administered 2021-01-14 (×2): 120 ug via INTRAVENOUS
  Administered 2021-01-14: 160 ug via INTRAVENOUS

## 2021-01-14 MED ORDER — FENTANYL CITRATE (PF) 100 MCG/2ML IJ SOLN: 25.0000 ug | INTRAMUSCULAR | Status: DC | PRN

## 2021-01-14 MED ORDER — AMOXICILLIN-POT CLAVULANATE 875-125 MG PO TABS: 1.0000 | ORAL_TABLET | Freq: Two times a day (BID) | ORAL | 0 refills | Status: AC

## 2021-01-14 MED ORDER — SUCCINYLCHOLINE CHLORIDE 200 MG/10ML IV SOSY
PREFILLED_SYRINGE | INTRAVENOUS | Status: DC | PRN
  Administered 2021-01-14: 100 mg via INTRAVENOUS

## 2021-01-14 MED ORDER — SUCCINYLCHOLINE CHLORIDE 200 MG/10ML IV SOSY
PREFILLED_SYRINGE | INTRAVENOUS | Status: AC
  Filled 2021-01-14: qty 10

## 2021-01-14 MED ORDER — DEXAMETHASONE SODIUM PHOSPHATE 10 MG/ML IJ SOLN
INTRAMUSCULAR | Status: AC
  Filled 2021-01-14: qty 1

## 2021-01-14 MED ORDER — ACETAMINOPHEN 10 MG/ML IV SOLN: 1000.0000 mg | Freq: Once | INTRAVENOUS | Status: DC | PRN

## 2021-01-14 MED ORDER — LIDOCAINE 2% (20 MG/ML) 5 ML SYRINGE
INTRAMUSCULAR | Status: DC | PRN
  Administered 2021-01-14: 60 mg via INTRAVENOUS

## 2021-01-14 MED ORDER — LIDOCAINE 2% (20 MG/ML) 5 ML SYRINGE
INTRAMUSCULAR | Status: AC
  Filled 2021-01-14: qty 5

## 2021-01-14 MED ORDER — LACTATED RINGERS IV SOLN: INTRAVENOUS | Status: DC

## 2021-01-14 MED ORDER — IPRATROPIUM-ALBUTEROL 0.5-2.5 (3) MG/3ML IN SOLN
RESPIRATORY_TRACT | Status: AC
  Filled 2021-01-14: qty 3

## 2021-01-14 MED ORDER — ALBUTEROL SULFATE (2.5 MG/3ML) 0.083% IN NEBU
2.5000 mg | INHALATION_SOLUTION | Freq: Once | RESPIRATORY_TRACT | Status: AC
  Administered 2021-01-14: 2.5 mg via RESPIRATORY_TRACT

## 2021-01-14 MED ORDER — EPHEDRINE SULFATE 50 MG/ML IJ SOLN
INTRAMUSCULAR | Status: DC | PRN
  Administered 2021-01-14: 15 mg via INTRAVENOUS
  Administered 2021-01-14: 10 mg via INTRAVENOUS

## 2021-01-14 MED ORDER — LIDOCAINE-EPINEPHRINE 1 %-1:100000 IJ SOLN
INTRAMUSCULAR | Status: AC
  Filled 2021-01-14: qty 3

## 2021-01-14 MED ORDER — PROPOFOL 10 MG/ML IV BOLUS
INTRAVENOUS | Status: AC
  Filled 2021-01-14: qty 40

## 2021-01-14 MED ORDER — LABETALOL HCL 5 MG/ML IV SOLN: 5.0000 mg | INTRAVENOUS | Status: DC | PRN

## 2021-01-14 MED ORDER — ALBUTEROL SULFATE HFA 108 (90 BASE) MCG/ACT IN AERS
INHALATION_SPRAY | RESPIRATORY_TRACT | Status: AC
  Filled 2021-01-14: qty 6.7

## 2021-01-14 MED ORDER — FENTANYL CITRATE (PF) 100 MCG/2ML IJ SOLN
INTRAMUSCULAR | Status: AC
  Filled 2021-01-14: qty 2

## 2021-01-14 MED ORDER — BACITRACIN ZINC 500 UNIT/GM EX OINT
TOPICAL_OINTMENT | CUTANEOUS | Status: AC
  Filled 2021-01-14: qty 0.9

## 2021-01-14 MED ORDER — PROPOFOL 10 MG/ML IV BOLUS
INTRAVENOUS | Status: DC | PRN
  Administered 2021-01-14: 140 mg via INTRAVENOUS

## 2021-01-14 MED ORDER — EPINEPHRINE PF 1 MG/ML IJ SOLN
INTRAMUSCULAR | Status: DC | PRN
  Administered 2021-01-14: .5 mL

## 2021-01-14 MED ORDER — ALBUTEROL SULFATE (2.5 MG/3ML) 0.083% IN NEBU
INHALATION_SOLUTION | RESPIRATORY_TRACT | Status: AC
  Filled 2021-01-14: qty 3

## 2021-01-14 MED ORDER — ALBUTEROL SULFATE HFA 108 (90 BASE) MCG/ACT IN AERS
INHALATION_SPRAY | RESPIRATORY_TRACT | Status: DC | PRN
  Administered 2021-01-14: 12 via RESPIRATORY_TRACT

## 2021-01-14 MED ORDER — LABETALOL HCL 5 MG/ML IV SOLN
INTRAVENOUS | Status: AC
  Filled 2021-01-14: qty 4

## 2021-01-14 MED ORDER — LABETALOL HCL 5 MG/ML IV SOLN
INTRAVENOUS | Status: DC | PRN
  Administered 2021-01-14 (×3): 5 mg via INTRAVENOUS

## 2021-01-14 MED ORDER — DEXAMETHASONE SODIUM PHOSPHATE 10 MG/ML IJ SOLN
INTRAMUSCULAR | Status: DC | PRN
  Administered 2021-01-14 (×2): 5 mg via INTRAVENOUS

## 2021-01-14 MED ORDER — ONDANSETRON HCL 4 MG/2ML IJ SOLN
INTRAMUSCULAR | Status: AC
  Filled 2021-01-14: qty 2

## 2021-01-14 MED ORDER — IPRATROPIUM-ALBUTEROL 0.5-2.5 (3) MG/3ML IN SOLN
3.0000 mL | Freq: Once | RESPIRATORY_TRACT | Status: AC
  Administered 2021-01-14: 3 mL via RESPIRATORY_TRACT

## 2021-01-14 MED ORDER — ONDANSETRON HCL 4 MG/2ML IJ SOLN
INTRAMUSCULAR | Status: DC | PRN
Start: 2021-01-14 — End: 2021-01-14
  Administered 2021-01-14: 4 mg via INTRAVENOUS

## 2021-01-14 MED ORDER — ROCURONIUM BROMIDE 10 MG/ML (PF) SYRINGE
PREFILLED_SYRINGE | INTRAVENOUS | Status: AC
  Filled 2021-01-14: qty 10

## 2021-01-14 MED ORDER — PHENYLEPHRINE 40 MCG/ML (10ML) SYRINGE FOR IV PUSH (FOR BLOOD PRESSURE SUPPORT)
PREFILLED_SYRINGE | INTRAVENOUS | Status: AC
  Filled 2021-01-14: qty 10

## 2021-01-14 MED ORDER — FENTANYL CITRATE (PF) 100 MCG/2ML IJ SOLN
INTRAMUSCULAR | Status: DC | PRN
  Administered 2021-01-14: 100 ug via INTRAVENOUS

## 2021-01-14 MED ORDER — EPHEDRINE 5 MG/ML INJ
INTRAVENOUS | Status: AC
  Filled 2021-01-14: qty 5

## 2021-01-14 SURGICAL SUPPLY — 21 items
CANISTER SUCT 1200ML W/VALVE (MISCELLANEOUS) ×2 IMPLANT
DEFOGGER MIRROR 1QT (MISCELLANEOUS) ×2 IMPLANT
GAUZE SPONGE 4X4 12PLY STRL LF (GAUZE/BANDAGES/DRESSINGS) ×2 IMPLANT
GLOVE SURG ENC MOIS LTX SZ7.5 (GLOVE) ×2 IMPLANT
GOWN STRL REUS W/ TWL LRG LVL3 (GOWN DISPOSABLE) ×1 IMPLANT
GOWN STRL REUS W/TWL LRG LVL3 (GOWN DISPOSABLE)
GUARD TEETH (MISCELLANEOUS) ×2 IMPLANT
MARKER SKIN DUAL TIP RULER LAB (MISCELLANEOUS) ×1 IMPLANT
NDL SAFETY ECLIPSE 18X1.5 (NEEDLE) ×1 IMPLANT
NDL SPNL 22GX7 QUINCKE BK (NEEDLE) IMPLANT
NEEDLE HYPO 18GX1.5 SHARP (NEEDLE)
NEEDLE SPNL 22GX7 QUINCKE BK (NEEDLE) IMPLANT
NS IRRIG 1000ML POUR BTL (IV SOLUTION) ×2 IMPLANT
PACK BASIN DAY SURGERY FS (CUSTOM PROCEDURE TRAY) ×1 IMPLANT
PATTIES SURGICAL .5 X3 (DISPOSABLE) ×2 IMPLANT
SHEET MEDIUM DRAPE 40X70 STRL (DRAPES) ×2 IMPLANT
SLEEVE SCD COMPRESS KNEE MED (STOCKING) ×2 IMPLANT
SYR CONTROL 10ML LL (SYRINGE) ×1 IMPLANT
SYR TB 1ML LL NO SAFETY (SYRINGE) ×1 IMPLANT
TOWEL GREEN STERILE FF (TOWEL DISPOSABLE) ×2 IMPLANT
TUBE CONNECTING 20X1/4 (TUBING) ×2 IMPLANT

## 2021-01-14 NOTE — Op Note (Signed)
DATE OF PROCEDURE:  01/14/2021                              OPERATIVE REPORT  SURGEON:  Leta Baptist, MD  PREOPERATIVE DIAGNOSES: 1. Left vocal cord mass. 2. Chronic hoarseness.  POSTOPERATIVE DIAGNOSES: 1. Left transglottic vocal cord mass. 2. Chronic hoarseness.  PROCEDURE PERFORMED: MicroDirect laryngoscopy with biopsy.  ANESTHESIA:  General endotracheal tube anesthesia.  COMPLICATIONS:  None.  ESTIMATED BLOOD LOSS:  Minimal.  INDICATION FOR PROCEDURE:  Jerry Palmer is a 75 y.o. male with a history of chronic hoarseness since June of this year.  On his laryngoscopy examination, the patient was noted to have a large fungating mass on the left vocal cord.  The right vocal cord was noted to be mobile.  The appearance was suggestive of squamous of carcinoma. Based on the above findings, the decision was made for the patient to undergo the MicroDirect laryngoscopy and biopsy procedure. The risks, benefits, alternatives, and details of the procedure were discussed with the patient.  Questions were invited and answered.  Informed consent was obtained.  DESCRIPTION:  The patient was taken to the operating room and placed supine on the operating table.  General endotracheal tube anesthesia was administered by the anesthesiologist.  The patient was positioned and prepped and draped in a standard fashion for direct laryngoscopy.  A Dedo laryngoscope was used for examination.  The laryngoscope was inserted via the oral cavity into the pharynx.  Examination of the epiglottis, vallecula, and piriform sinuses were all normal.  Examination of the laryngeal opening revealed a large left vocal cord mass.  The mass was noted to be extending past the vocal cord into the subglottic region.  The Dedo laryngoscope was suspended with the Lewy suspender.  An operating microscope was brought into the field.  Multiple biopsy specimens were obtained from the left vocal cord mass.  Hemostasis was achieved with  pledgets soaked with epinephrine.  The Dedo laryngoscope was withdrawn.  The care of the patient was turned over to the anesthesiologist.  The patient was awakened from anesthesia without difficulty.  The patient was extubated and transferred to the recovery room in good condition.  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  SPECIMEN: Left vocal cord mass biopsy specimens.  FOLLOWUP CARE:  The patient will be discharged home once awake and alert.   The patient will follow up in my office in approximately 1 week.  [name] 01/14/2021 8:07 AM

## 2021-01-14 NOTE — Anesthesia Procedure Notes (Signed)
Procedure Name: Intubation Date/Time: 01/14/2021 7:42 AM Performed by: Lavonia Dana, CRNA Pre-anesthesia Checklist: Patient identified, Emergency Drugs available, Suction available and Patient being monitored Patient Re-evaluated:Patient Re-evaluated prior to induction Oxygen Delivery Method: Circle system utilized Preoxygenation: Pre-oxygenation with 100% oxygen Induction Type: IV induction Ventilation: Mask ventilation without difficulty Laryngoscope Size: Glidescope and 4 Grade View: Grade I Tube type: Oral Tube size: 6.0 mm Number of attempts: 1 Airway Equipment and Method: Stylet and Oral airway Placement Confirmation: ETT inserted through vocal cords under direct vision, positive ETCO2 and breath sounds checked- equal and bilateral Secured at: 24 cm Tube secured with: Tape Dental Injury: Teeth and Oropharynx as per pre-operative assessment

## 2021-01-14 NOTE — Transfer of Care (Signed)
Immediate Anesthesia Transfer of Care Note  Patient: Jerry Palmer  Procedure(s) Performed: MICROLARYNGOSCOPY WITH BIOPSY OF VOCAL CORD MASS (Throat)  Patient Location: PACU  Anesthesia Type:General  Level of Consciousness: awake, alert  and oriented  Airway & Oxygen Therapy: Patient Spontanous Breathing and Patient connected to face mask oxygen  Post-op Assessment: Report given to RN and Post -op Vital signs reviewed and stable  Post vital signs: Reviewed and stable  Last Vitals:  Vitals Value Taken Time  BP 176/103 01/14/21 0845  Temp 36.6 C 01/14/21 0840  Pulse 92 01/14/21 0849  Resp 20 01/14/21 0849  SpO2 100 % 01/14/21 0849  Vitals shown include unvalidated device data.  Last Pain:  Vitals:   01/14/21 0639  TempSrc: Oral  PainSc: 0-No pain      Patients Stated Pain Goal: 5 (08/65/78 4696)  Complications: No notable events documented.

## 2021-01-14 NOTE — Discharge Instructions (Addendum)
The patient may resume all his previous activities and diet.  He has no postop restriction.  He has a follow-up appointment in 1 week.   Post Anesthesia Home Care Instructions  Activity: Get plenty of rest for the remainder of the day. A responsible individual must stay with you for 24 hours following the procedure.  For the next 24 hours, DO NOT: -Drive a car -Paediatric nurse -Drink alcoholic beverages -Take any medication unless instructed by your physician -Make any legal decisions or sign important papers.  Meals: Start with liquid foods such as gelatin or soup. Progress to regular foods as tolerated. Avoid greasy, spicy, heavy foods. If nausea and/or vomiting occur, drink only clear liquids until the nausea and/or vomiting subsides. Call your physician if vomiting continues.  Special Instructions/Symptoms: Your throat may feel dry or sore from the anesthesia or the breathing tube placed in your throat during surgery. If this causes discomfort, gargle with warm salt water. The discomfort should disappear within 24 hours.  If you had a scopolamine patch placed behind your ear for the management of post- operative nausea and/or vomiting:  1. The medication in the patch is effective for 72 hours, after which it should be removed.  Wrap patch in a tissue and discard in the trash. Wash hands thoroughly with soap and water. 2. You may remove the patch earlier than 72 hours if you experience unpleasant side effects which may include dry mouth, dizziness or visual disturbances. 3. Avoid touching the patch. Wash your hands with soap and water after contact with the patch.      Take home Albuterol Sulfate Inhalation 7mcq. Take 2 puffs every 4-6 hours as needed for shortness of breath.

## 2021-01-14 NOTE — H&P (Signed)
Cc: Chronic hoarseness  HPI: The patient is a 75 y/o male who presents today with his family for evaluation of chronic hoarseness. The patient noted onset of hoarseness in June but his voice has progressively gotten worse over the past several months. The patient is barely able to talk above a whisper. He denies dysphagia or odynophagia. He has noted some throat constriction recently. The patient also noted left ear and throat pain a week ago. He was seen at urgent care and diagnosed with an ear infection. The patient took a course of antibiotics and his pain has resolved. The patient has occasional reflux. He quit smoking over 40 years ago. Previous ENT surgery is denied.   The patient's review of systems (constitutional, eyes, ENT, cardiovascular, respiratory, GI, musculoskeletal, skin, neurologic, psychiatric, endocrine, hematologic, allergic) is noted in the ROS questionnaire.  It is reviewed with the patient and his family.   Major events: None.  Ongoing medical problems: Cataracts, hypertension, diabetes, seasonal allergies.  Family health history: Diabetes.  Social history: The patient is married. He is a former smoker. He denies the use of alcohol or illegal drugs.   Exam: General: Communicates with difficulty, well nourished, no acute distress. Head: Normocephalic, no evidence injury, no tenderness, facial buttresses intact without stepoff. Eyes: PERRL, EOMI.  No scleral icterus, conjunctivae clear. Ears: Bilateral cerumen impaction. Nose: Normal skin and external support.  Anterior rhinoscopy reveals healthy pink mucosa over the septum and turbinates.  No lesions or polyps were seen. Oral cavity: Lips without lesions, oral mucosa moist, no masses or lesions seen. Indirect  mirror laryngoscopy could not be tolerated. Pharynx: Clear, no erythema. Neck: Supple, full range of motion, no lymphadenopathy, no masses palpable. Salivary: Parotid and submandibular glands without mass. Neuro:  CN 2-12  grossly intact. Gait normal. Vestibular: No nystagmus at any point of gaze. A flexible scope was inserted into the right nasal cavity and advanced towards the nasopharynx.  Visualized mucosa over the turbinates and septum were normal.  The nasopharynx was clear.  Oropharyngeal walls were symmetric and mobile without lesion, mass, or edema.  Hypopharynx was also without  lesion or edema.  Larynx was mobile without lesions.  No lesions or asymmetry in the supraglottic larynx.  Arytenoid mucosa was edematous with slight erythema.  Large ulcerative left vocal cord mass. The right vocal cord is mobile.  Base of tongue was within normal limits. The patient tolerated the procedure well.   Assessment  1. Bilateral cerumen impaction. The patient's ear canals, tympanic membranes and middle ear spaces are all normal.  2. 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 No other lesion is noted on today's fiberoptic laryngoscopy exam.  Plan  1. Otomicroscopy with bilateral cerumen removal. 2. The laryngoscopy findings are reviewed with the patient and his family. 3. The patient will need Mirco DL with biopsy as soon as possible to confirm his diagnosis. The risks, benefits, alternatives, and details of the procedure are reviewed with the patient. Questions are invited and answered. 4. The procedure will be scheduled as soon as possible.

## 2021-01-14 NOTE — Anesthesia Postprocedure Evaluation (Signed)
Anesthesia Post Note  Patient: Jerry Palmer  Procedure(s) Performed: MICROLARYNGOSCOPY WITH BIOPSY OF VOCAL CORD MASS (Throat)     Patient location during evaluation: PACU Anesthesia Type: General Level of consciousness: awake and alert Pain management: pain level controlled Vital Signs Assessment: post-procedure vital signs reviewed and stable Respiratory status: spontaneous breathing, nonlabored ventilation, respiratory function stable and patient connected to face mask oxygen Cardiovascular status: blood pressure returned to baseline and stable Postop Assessment: no apparent nausea or vomiting Comments: Patient with post-operative stridor 2/2 to known VC lesion with swelling. Vitals stable. Albuterol MDI given intraoperatively, dexamethasone as well. In PACU duoneb administered with improvement in patient's pulmonary function back to baseline. Vitals stable throughout.    No notable events documented.  Last Vitals:  Vitals:   01/14/21 0945 01/14/21 1027  BP: (!) 156/90 (!) 163/104  Pulse: 90 100  Resp: 17 18  Temp:  36.8 C  SpO2: 95% 100%    Last Pain:  Vitals:   01/14/21 1013  TempSrc:   PainSc: 0-No pain                 Belenda Cruise P Semone Orlov

## 2021-01-17 ENCOUNTER — Other Ambulatory Visit (HOSPITAL_COMMUNITY): Payer: Self-pay | Admitting: Otolaryngology

## 2021-01-17 ENCOUNTER — Encounter (HOSPITAL_BASED_OUTPATIENT_CLINIC_OR_DEPARTMENT_OTHER): Payer: Self-pay | Admitting: Otolaryngology

## 2021-01-17 ENCOUNTER — Other Ambulatory Visit: Payer: Self-pay | Admitting: Otolaryngology

## 2021-01-17 DIAGNOSIS — C320 Malignant neoplasm of glottis: Secondary | ICD-10-CM

## 2021-01-17 LAB — SURGICAL PATHOLOGY

## 2021-01-20 ENCOUNTER — Telehealth: Payer: Self-pay | Admitting: Oncology

## 2021-01-20 ENCOUNTER — Ambulatory Visit (INDEPENDENT_AMBULATORY_CARE_PROVIDER_SITE_OTHER): Payer: PPO

## 2021-01-20 DIAGNOSIS — Z23 Encounter for immunization: Secondary | ICD-10-CM | POA: Diagnosis not present

## 2021-01-20 NOTE — Progress Notes (Signed)
Pt was given HD flu vacc w/o any complications. 

## 2021-01-20 NOTE — Telephone Encounter (Signed)
Pateint referred by Dr Kasandra Knudsen Raynelle Bring for Laryngeal CA.  Appt made for 02/03/21 Labs 2:45 pm - Consult 3:15 pm

## 2021-01-21 DIAGNOSIS — C328 Malignant neoplasm of overlapping sites of larynx: Secondary | ICD-10-CM | POA: Diagnosis not present

## 2021-01-31 ENCOUNTER — Encounter (HOSPITAL_COMMUNITY)
Admission: RE | Admit: 2021-01-31 | Discharge: 2021-01-31 | Disposition: A | Discharge status: Home / Self Care | Payer: PPO | Source: Ambulatory Visit | Attending: Otolaryngology | Admitting: Otolaryngology

## 2021-01-31 DIAGNOSIS — C320 Malignant neoplasm of glottis: Secondary | ICD-10-CM | POA: Diagnosis not present

## 2021-01-31 DIAGNOSIS — N400 Enlarged prostate without lower urinary tract symptoms: Secondary | ICD-10-CM | POA: Diagnosis not present

## 2021-01-31 DIAGNOSIS — I2510 Atherosclerotic heart disease of native coronary artery without angina pectoris: Secondary | ICD-10-CM | POA: Diagnosis not present

## 2021-01-31 DIAGNOSIS — I700 Atherosclerosis of aorta: Secondary | ICD-10-CM | POA: Diagnosis not present

## 2021-01-31 LAB — GLUCOSE, CAPILLARY: Glucose-Capillary: 100 mg/dL — ABNORMAL HIGH (ref 70–99)

## 2021-01-31 MED ORDER — FLUDEOXYGLUCOSE F - 18 (FDG) INJECTION
7.4200 | Freq: Once | INTRAVENOUS | Status: AC
  Administered 2021-01-31: 7.42 via INTRAVENOUS

## 2021-02-01 ENCOUNTER — Other Ambulatory Visit: Payer: Self-pay | Admitting: Internal Medicine

## 2021-02-01 ENCOUNTER — Encounter: Payer: Self-pay | Admitting: Internal Medicine

## 2021-02-01 DIAGNOSIS — F418 Other specified anxiety disorders: Secondary | ICD-10-CM

## 2021-02-01 MED ORDER — CLONAZEPAM 0.5 MG PO TABS: 0.5000 mg | ORAL_TABLET | Freq: Two times a day (BID) | ORAL | 0 refills | Status: DC | PRN

## 2021-02-02 ENCOUNTER — Ambulatory Visit
Admission: RE | Admit: 2021-02-02 | Discharge: 2021-02-02 | Disposition: A | Discharge status: Home / Self Care | Payer: PPO | Source: Ambulatory Visit | Attending: Otolaryngology | Admitting: Otolaryngology

## 2021-02-02 DIAGNOSIS — J383 Other diseases of vocal cords: Secondary | ICD-10-CM

## 2021-02-02 DIAGNOSIS — J387 Other diseases of larynx: Secondary | ICD-10-CM | POA: Diagnosis not present

## 2021-02-02 DIAGNOSIS — R221 Localized swelling, mass and lump, neck: Secondary | ICD-10-CM | POA: Diagnosis not present

## 2021-02-02 DIAGNOSIS — Q313 Laryngocele: Secondary | ICD-10-CM | POA: Diagnosis not present

## 2021-02-02 DIAGNOSIS — M47812 Spondylosis without myelopathy or radiculopathy, cervical region: Secondary | ICD-10-CM | POA: Diagnosis not present

## 2021-02-02 MED ORDER — IOPAMIDOL (ISOVUE-300) INJECTION 61%
75.0000 mL | Freq: Once | INTRAVENOUS | Status: AC | PRN
  Administered 2021-02-02: 75 mL via INTRAVENOUS

## 2021-02-02 NOTE — Progress Notes (Signed)
[facility]  [address] [place],  [place]  [address] [phone]  Clinic Day:  02/03/2021  Referring physician: Raylene Miyamoto, MD   HISTORY OF PRESENT ILLNESS:  The patient is a 75 y.o. male  who I was asked to consult upon for newly diagnosed laryngeal cancer.  The patient claims he dealt with a hoarse voice for 4 months that never improved with allergy or other other-the-counter medications.  He was sent to ENT for further evaluation.  A laryngoscopy was done in October 2022, which revealed a left laryngeal mass.  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  He also underwent a PET scan earlier this week, which showed this mass to be hypermetabolic.  Also seen was a 6 mm hypermetabolic left level 2A cervical lymph node, consistent with nodal metastasis.  He comes in today to go over his biopsy and scan results, as well as their implications.  He claims he has lost 15 pounds over the past 6 weeks.  He did smoke heavily for 20 years, but has not smoked in over 40 years.    PAST MEDICAL HISTORY:   Past Medical History:  Diagnosis Date   Cancer ([place])    Basal Cell   Chronic kidney disease    CKD stg 3   Diabetes mellitus without complication ([place])    Diverticulosis of colon    Fasting hyperglycemia    Hematuria    PMH of   Hyperlipidemia    Hypertension    no meds now   Vocal cord mass     PAST SURGICAL HISTORY:   Past Surgical History:  Procedure Laterality Date   CATARACT EXTRACTION  04/06/14   right eye   cataract left eye  1995   COLONOSCOPY W/ POLYPECTOMY      polyp x 1; negative X 2;Dr Sharlett Iles   cyst scalp  2005   MICROLARYNGOSCOPY N/A 01/14/2021   Procedure: MICROLARYNGOSCOPY WITH BIOPSY OF VOCAL CORD MASS;  Surgeon: Leta Baptist, MD;  Location: [facility];  Service: ENT;  Laterality: N/A;    CURRENT MEDICATIONS:   Current Outpatient  Medications  Medication Sig Dispense Refill   aspirin EC 81 MG tablet Take 1 tablet (81 mg total) by mouth daily. 90 tablet 3   Blood Glucose Monitoring Suppl (CONTOUR NEXT MONITOR) w/Device KIT 1 Act by Does not apply route 2 (two) times a day. 2 kit 0   clonazePAM (KLONOPIN) 0.5 MG tablet Take 1 tablet (0.5 mg total) by mouth 2 (two) times daily as needed for anxiety. 180 tablet 0   Empagliflozin-metFORMIN HCl (SYNJARDY) 12.5-500 MG TABS Take 1 tablet by mouth 2 (two) times daily. 180 tablet 1   glucose blood (CONTOUR NEXT TEST) test strip USE TO CHECK SUGAR TWICE A DAY. WHAT METER HAVE 200 strip 2   levocetirizine (XYZAL) 5 MG tablet Take 1 tablet (5 mg total) by mouth every evening. 90 tablet 1   rosuvastatin (CRESTOR) 20 MG tablet TAKE 1 TABLET BY MOUTH EVERY DAY 90 tablet 1   No current facility-administered medications for this visit.    ALLERGIES:   Allergies  Allergen Reactions   Angiotensin Receptor Blockers     Past medical history of angioedema with ACE inhibitor   Lisinopril     Lip swelling   Bactrim [Sulfamethoxazole-Trimethoprim] Rash    FAMILY HISTORY:   Family History  Problem Relation Age of Onset   Cancer  Brother        ?spinal mets   Colon cancer Brother 72   Colon cancer Brother 4   Colon cancer Brother 6   Breast cancer Mother    Hypertension Mother    Diabetes Sister    Stroke Maternal Grandfather         > 63   Heart disease Neg Hx     SOCIAL HISTORY:  The patient was born and raised in [place].  He lives in [place] with his wife of 64 years.  He worked in [employer] for 44 years.  He was also a Physicist, medical.  He smoked as much as 2 packs of cigarettes daily for 21 years before quitting 41 years ago.  There is no history of alcohol abuse.    REVIEW OF SYSTEMS:  Review of Systems  Constitutional:  Positive for fatigue. Negative for fever and unexpected weight change.  Respiratory:  Positive for shortness of breath. Negative for  chest tightness, cough and hemoptysis.   Cardiovascular:  Negative for chest pain and palpitations.  Gastrointestinal:  Negative for abdominal distention, abdominal pain, blood in stool, constipation, diarrhea, nausea and vomiting.  Genitourinary:  Negative for dysuria, frequency and hematuria.   Musculoskeletal:  Negative for arthralgias, back pain and myalgias.  Skin:  Negative for itching and rash.  Neurological:  Negative for dizziness, headaches and light-headedness.  Psychiatric/Behavioral:  Negative for depression and suicidal ideas. The patient is nervous/anxious.     PHYSICAL EXAM:  Blood pressure (!) 173/85, pulse (!) 101, temperature 98.3 F (36.8 C), resp. rate 16, height 5' 8" (1.727 m), weight 142 lb 14.4 oz (64.8 kg), SpO2 96 %. Wt Readings from Last 3 Encounters:  02/03/21 142 lb 14.4 oz (64.8 kg)  01/14/21 149 lb 0.5 oz (67.6 kg)  11/10/20 162 lb (73.5 kg)   Body mass index is 21.73 kg/m. Performance status (ECOG): 1 - Symptomatic but completely ambulatory Physical Exam Constitutional:      Appearance: Normal appearance. He is not ill-appearing.     Comments: He does appear moderately short of breath from his underlying tumor  HENT:     Mouth/Throat:     Mouth: Mucous membranes are moist.     Pharynx: Oropharynx is clear. No oropharyngeal exudate or posterior oropharyngeal erythema.  Cardiovascular:     Rate and Rhythm: Normal rate and regular rhythm.     Heart sounds: No murmur heard.   No friction rub. No gallop.  Pulmonary:     Effort: Pulmonary effort is normal. No respiratory distress.     Breath sounds: Normal breath sounds. No wheezing, rhonchi or rales.  Abdominal:     General: Bowel sounds are normal. There is no distension.     Palpations: Abdomen is soft. There is no mass.     Tenderness: There is no abdominal tenderness.  Musculoskeletal:        General: No swelling.     Right lower leg: No edema.     Left lower leg: No edema.  Lymphadenopathy:      Cervical: No cervical adenopathy.     Upper Body:     Right upper body: No supraclavicular or axillary adenopathy.     Left upper body: No supraclavicular or axillary adenopathy.     Lower Body: No right inguinal adenopathy. No left inguinal adenopathy.  Skin:    General: Skin is warm.     Coloration: Skin is not jaundiced.     Findings: No lesion  or rash.  Neurological:     General: No focal deficit present.     Mental Status: He is alert and oriented to person, place, and time. Mental status is at baseline.  Psychiatric:        Mood and Affect: Mood normal.        Behavior: Behavior normal.        Thought Content: Thought content normal.   LABS:   CBC Latest Ref Rng & Units 02/03/2021 11/10/2020 06/02/2020  WBC - 10.5 6.7 6.5  Hemoglobin 13.5 - 17.5 16.1 16.3 16.5  Hematocrit 41 - 53 48 48.2 48.7  Platelets 150 - 399 161 134.0(L) 132.0(L)   CMP Latest Ref Rng & Units 02/03/2021 01/11/2021 11/10/2020  Glucose 70 - 99 mg/dL - 95 91  BUN 4 - 21 24(A) 14 17  Creatinine 0.6 - 1.3 0.8 0.89 1.11  Sodium 137 - 147 141 140 141  Potassium 3.4 - 5.3 4.2 4.3 4.8  Chloride 99 - 108 102 106 106  CO2 13 - 22 30(A) 27 28  Calcium 8.7 - 10.7 9.4 9.5 10.2  Total Protein 6.0 - 8.3 g/dL - - -  Total Bilirubin 0.2 - 1.2 mg/dL - - -  Alkaline Phos 25 - 125 94 - -  AST 14 - 40 26 - -  ALT 10 - 40 22 - -   STUDIES:  CT SOFT TISSUE NECK W CONTRAST  Result Date: 02/03/2021 CLINICAL DATA:  Vocal cord mass, and new diagnosis. EXAM: CT NECK WITH CONTRAST TECHNIQUE: Multidetector CT imaging of the neck was performed using the standard protocol following the bolus administration of intravenous contrast. CONTRAST:  43m ISOVUE-300 IOPAMIDOL (ISOVUE-300) INJECTION 61% COMPARISON:  None. FINDINGS: Pharynx and larynx: Left laryngeal mass at the level of the glottis and supraglottic larynx with tumor measuring approximately 22 x 29 mm on axial images and extending from the anterior commissure to posterior  commissure. There is left paraglottic fat invasion. No cartilage transgression. The left arytenoid is sclerotic and smaller than the right. Small internal laryngocele on the left. Salivary glands: No inflammation, mass, or stone. Thyroid: Normal. Lymph nodes: None enlarged or abnormal density. Vascular: Scattered atheromatous calcification Limited intracranial: Negative Visualized orbits: Bilateral cataract resection Mastoids and visualized paranasal sinuses: Clear Skeleton: Generalized cervical spine degeneration. Upper chest: Azygous fissure.  No nodules. 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 No adenopathy. Electronically Signed   By: JJorje GuildM.D.   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 EXAM: NUCLEAR MEDICINE PET SKULL BASE TO THIGH TECHNIQUE: 7.2 mCi F-18 FDG was injected intravenously. Full-ring PET imaging was performed from the skull base to thigh after the radiotracer. CT data was obtained and used for attenuation correction and anatomic localization. Fasting blood glucose: 100 mg/dl COMPARISON:  None. FINDINGS: Mediastinal blood-pool activity (background): SUV max = 2.8 Liver activity (reference): SUV max = N/A NECK: Irregular soft tissue thickening is seen involving the left vocal cord, which shows intense hypermetabolic activity, with SUV max 21.0. A 6 mm left level 2A lymph node (image 40/4) shows hypermetabolic activity, with SUV max of 3.7. No other hypermetabolic nodes are seen within the neck. Incidental CT findings:  None. CHEST: No hypermetabolic masses or lymphadenopathy. No hypermetabolic pulmonary nodules seen on  CT images. A 3 mm nodule is seen in the right upper lobe on image 88/4, and a 4 mm nodule is seen in the anterior right lower  lobe on image 90/4 is. Neither of these show FDG uptake, but they are too small to reliably characterize by PET. Incidental CT findings: Aortic and coronary atherosclerotic calcification noted. Azygous fissure incidentally noted. ABDOMEN/PELVIS: No abnormal hypermetabolic activity within the liver, pancreas, adrenal glands, or spleen. No hypermetabolic lymph nodes in the abdomen or pelvis. Incidental CT findings: Aortic atherosclerotic calcification noted. Mild to moderately enlarged prostate. SKELETON: No focal hypermetabolic bone lesions to suggest skeletal metastasis. Incidental CT findings:  None. IMPRESSION: Hypermetabolic left vocal cord mass, consistent with known primary carcinoma. 6 mm hypermetabolic left level 2A cervical lymph node, consistent with metastatic disease. No definite evidence of metastatic disease within the chest, abdomen, or pelvis. Two tiny less than 5 mm right lung nodules show no FDG uptake, but are too small to characterize by PET. Recommend continued attention on follow-up CT. Electronically Signed   By: Marlaine Hind M.D.   On: 01/31/2021 15:49     ASSESSMENT & PLAN:  A 75 y.o. male who I was asked to consult upon for what clinically appears to be stage III (T1 N1 M0) laryngeal cancer.  Based upon having nodal metastasis, he understands he will need combined modality therapy for his disease management.  As he and his family expressed concerns with how well he could tolerate platinum-based chemoradiation, the mutual decision was made to give him cetuximab, in conjunction with radiation, to treat his disease, with the overall goal still being cure.  The patient was made aware of the side effects that can go along with cetuximab, including severe skin reactions and potential allergic infusional reactions.  I will arrange for him to have a port and feeding tube placed before he commences with therapy.  He will receive a loading dose of cetuximab before his combination therapy commences  a week later.  I will see him back in approximately 1 month to see how he is doing with his definitive therapy.  The patient and his wife understand all the plans discussed today and are in agreement with them.  I do appreciate Raylene Miyamoto, MD for his new consult.   Carmelo Reidel Macarthur Critchley, MD

## 2021-02-03 ENCOUNTER — Encounter: Payer: Self-pay | Admitting: Oncology

## 2021-02-03 ENCOUNTER — Inpatient Hospital Stay: Payer: PPO | Admitting: Oncology

## 2021-02-03 ENCOUNTER — Inpatient Hospital Stay: Payer: PPO | Attending: Oncology

## 2021-02-03 DIAGNOSIS — C329 Malignant neoplasm of larynx, unspecified: Secondary | ICD-10-CM | POA: Diagnosis not present

## 2021-02-03 DIAGNOSIS — J387 Other diseases of larynx: Secondary | ICD-10-CM | POA: Diagnosis not present

## 2021-02-03 DIAGNOSIS — D649 Anemia, unspecified: Secondary | ICD-10-CM | POA: Diagnosis not present

## 2021-02-03 LAB — CBC AND DIFFERENTIAL
HCT: 48 (ref 41–53)
Hemoglobin: 16.1 (ref 13.5–17.5)
Neutrophils Absolute: 7.67
Platelets: 161 (ref 150–399)
WBC: 10.5

## 2021-02-03 LAB — BASIC METABOLIC PANEL
BUN: 24 — AB (ref 4–21)
CO2: 30 — AB (ref 13–22)
Chloride: 102 (ref 99–108)
Creatinine: 0.8 (ref 0.6–1.3)
Glucose: 98
Potassium: 4.2 (ref 3.4–5.3)
Sodium: 141 (ref 137–147)

## 2021-02-03 LAB — COMPREHENSIVE METABOLIC PANEL
Albumin: 4.6 (ref 3.5–5.0)
Calcium: 9.4 (ref 8.7–10.7)

## 2021-02-03 LAB — HEPATIC FUNCTION PANEL
ALT: 22 (ref 10–40)
AST: 26 (ref 14–40)
Alkaline Phosphatase: 94 (ref 25–125)
Bilirubin, Total: 0.7

## 2021-02-03 LAB — CBC: RBC: 5.41 — AB (ref 3.87–5.11)

## 2021-02-03 NOTE — Progress Notes (Signed)
START ON PATHWAY REGIMEN - Head and Neck     Cycle 1: A cycle is 7 days:     Cetuximab    Cycles 2 and beyond: A cycle is every 7 days:     Cetuximab   **Always confirm dose/schedule in your pharmacy ordering system**  Patient Characteristics: Larynx, Preoperative or Nonsurgical Candidate, Stage III - IVB Disease Classification: Larynx AJCC T Category: T1 AJCC 8 Stage Grouping: III Therapeutic Status: Preoperative or Nonsurgical Candidate AJCC N Category: cN1 AJCC M Category: M0 Intent of Therapy: Curative Intent, Discussed with Patient

## 2021-02-06 ENCOUNTER — Encounter: Payer: Self-pay | Admitting: Oncology

## 2021-02-07 ENCOUNTER — Telehealth: Payer: Self-pay

## 2021-02-07 DIAGNOSIS — R490 Dysphonia: Secondary | ICD-10-CM | POA: Diagnosis not present

## 2021-02-07 DIAGNOSIS — R748 Abnormal levels of other serum enzymes: Secondary | ICD-10-CM | POA: Diagnosis not present

## 2021-02-07 DIAGNOSIS — Z20822 Contact with and (suspected) exposure to covid-19: Secondary | ICD-10-CM | POA: Diagnosis not present

## 2021-02-07 DIAGNOSIS — C320 Malignant neoplasm of glottis: Secondary | ICD-10-CM | POA: Diagnosis not present

## 2021-02-07 DIAGNOSIS — R059 Cough, unspecified: Secondary | ICD-10-CM | POA: Diagnosis not present

## 2021-02-07 DIAGNOSIS — Z79899 Other long term (current) drug therapy: Secondary | ICD-10-CM | POA: Diagnosis not present

## 2021-02-07 DIAGNOSIS — R109 Unspecified abdominal pain: Secondary | ICD-10-CM | POA: Diagnosis not present

## 2021-02-07 DIAGNOSIS — Z452 Encounter for adjustment and management of vascular access device: Secondary | ICD-10-CM | POA: Diagnosis not present

## 2021-02-07 DIAGNOSIS — R061 Stridor: Secondary | ICD-10-CM | POA: Diagnosis not present

## 2021-02-07 DIAGNOSIS — C322 Malignant neoplasm of subglottis: Secondary | ICD-10-CM | POA: Diagnosis not present

## 2021-02-07 DIAGNOSIS — I10 Essential (primary) hypertension: Secondary | ICD-10-CM | POA: Diagnosis not present

## 2021-02-07 DIAGNOSIS — C779 Secondary and unspecified malignant neoplasm of lymph node, unspecified: Secondary | ICD-10-CM | POA: Diagnosis not present

## 2021-02-07 DIAGNOSIS — R634 Abnormal weight loss: Secondary | ICD-10-CM | POA: Diagnosis not present

## 2021-02-07 DIAGNOSIS — K449 Diaphragmatic hernia without obstruction or gangrene: Secondary | ICD-10-CM | POA: Diagnosis not present

## 2021-02-07 DIAGNOSIS — N400 Enlarged prostate without lower urinary tract symptoms: Secondary | ICD-10-CM | POA: Diagnosis not present

## 2021-02-07 DIAGNOSIS — R509 Fever, unspecified: Secondary | ICD-10-CM | POA: Diagnosis not present

## 2021-02-07 DIAGNOSIS — I129 Hypertensive chronic kidney disease with stage 1 through stage 4 chronic kidney disease, or unspecified chronic kidney disease: Secondary | ICD-10-CM | POA: Diagnosis not present

## 2021-02-07 DIAGNOSIS — Z7982 Long term (current) use of aspirin: Secondary | ICD-10-CM | POA: Diagnosis not present

## 2021-02-07 DIAGNOSIS — E1122 Type 2 diabetes mellitus with diabetic chronic kidney disease: Secondary | ICD-10-CM | POA: Diagnosis not present

## 2021-02-07 DIAGNOSIS — E119 Type 2 diabetes mellitus without complications: Secondary | ICD-10-CM | POA: Diagnosis not present

## 2021-02-07 DIAGNOSIS — R079 Chest pain, unspecified: Secondary | ICD-10-CM | POA: Diagnosis not present

## 2021-02-07 DIAGNOSIS — R0602 Shortness of breath: Secondary | ICD-10-CM | POA: Diagnosis not present

## 2021-02-07 DIAGNOSIS — R918 Other nonspecific abnormal finding of lung field: Secondary | ICD-10-CM | POA: Diagnosis not present

## 2021-02-07 DIAGNOSIS — E876 Hypokalemia: Secondary | ICD-10-CM | POA: Diagnosis not present

## 2021-02-07 DIAGNOSIS — Z930 Tracheostomy status: Secondary | ICD-10-CM | POA: Diagnosis not present

## 2021-02-07 DIAGNOSIS — R221 Localized swelling, mass and lump, neck: Secondary | ICD-10-CM | POA: Diagnosis not present

## 2021-02-07 DIAGNOSIS — Z888 Allergy status to other drugs, medicaments and biological substances status: Secondary | ICD-10-CM | POA: Diagnosis not present

## 2021-02-07 DIAGNOSIS — J9801 Acute bronchospasm: Secondary | ICD-10-CM | POA: Diagnosis not present

## 2021-02-07 DIAGNOSIS — K3189 Other diseases of stomach and duodenum: Secondary | ICD-10-CM | POA: Diagnosis not present

## 2021-02-07 DIAGNOSIS — T380X5A Adverse effect of glucocorticoids and synthetic analogues, initial encounter: Secondary | ICD-10-CM | POA: Diagnosis not present

## 2021-02-07 DIAGNOSIS — N1831 Chronic kidney disease, stage 3a: Secondary | ICD-10-CM | POA: Diagnosis not present

## 2021-02-07 DIAGNOSIS — D72829 Elevated white blood cell count, unspecified: Secondary | ICD-10-CM | POA: Diagnosis not present

## 2021-02-07 DIAGNOSIS — J383 Other diseases of vocal cords: Secondary | ICD-10-CM | POA: Diagnosis not present

## 2021-02-07 DIAGNOSIS — C329 Malignant neoplasm of larynx, unspecified: Secondary | ICD-10-CM | POA: Diagnosis not present

## 2021-02-07 DIAGNOSIS — I708 Atherosclerosis of other arteries: Secondary | ICD-10-CM | POA: Diagnosis not present

## 2021-02-07 DIAGNOSIS — I700 Atherosclerosis of aorta: Secondary | ICD-10-CM | POA: Diagnosis not present

## 2021-02-07 DIAGNOSIS — R911 Solitary pulmonary nodule: Secondary | ICD-10-CM | POA: Diagnosis not present

## 2021-02-07 DIAGNOSIS — R0989 Other specified symptoms and signs involving the circulatory and respiratory systems: Secondary | ICD-10-CM | POA: Diagnosis not present

## 2021-02-07 DIAGNOSIS — R000 Tachycardia, unspecified: Secondary | ICD-10-CM | POA: Diagnosis not present

## 2021-02-07 DIAGNOSIS — R1310 Dysphagia, unspecified: Secondary | ICD-10-CM | POA: Diagnosis not present

## 2021-02-07 DIAGNOSIS — Z87891 Personal history of nicotine dependence: Secondary | ICD-10-CM | POA: Diagnosis not present

## 2021-02-07 DIAGNOSIS — Z7984 Long term (current) use of oral hypoglycemic drugs: Secondary | ICD-10-CM | POA: Diagnosis not present

## 2021-02-07 DIAGNOSIS — E1165 Type 2 diabetes mellitus with hyperglycemia: Secondary | ICD-10-CM | POA: Diagnosis not present

## 2021-02-07 NOTE — Telephone Encounter (Signed)
@  1625- I asked Dr Bobby Rumpf if he called the Er physician. He replied No. Pt has been transferred from [facility] ED to [facility] ED.  @ 1210- I reported below to Dr Bobby Rumpf.  @ 1200 - Pt's sister-in-law, Marlowe Kays, called reporting they have had "Dayna in emergency room since 8am with major difficulty breathing. The emergency room doctor has called Dr Benjamine Mola (ENT), who has recommended pt needing surgery to remove tumor, and place permanent trach. Before they do that, we want Dr Bobby Rumpf' opinion. I don't think he could handle radiation, he is too tired. Dr Sabra Heck (the ED physician) is talking about transferring him to [facility]".

## 2021-02-09 ENCOUNTER — Telehealth: Payer: Self-pay | Admitting: Oncology

## 2021-02-09 ENCOUNTER — Other Ambulatory Visit: Payer: Self-pay | Admitting: Pharmacist

## 2021-02-09 ENCOUNTER — Telehealth: Payer: Self-pay

## 2021-02-09 DIAGNOSIS — C329 Malignant neoplasm of larynx, unspecified: Secondary | ICD-10-CM

## 2021-02-09 NOTE — Telephone Encounter (Signed)
Per 11/10 LOS, patient scheduled for weekly Labs, 12/7 Follow UP - Per Katrina patient is currently at [facility].  Per Caregiver, he will be there for five days.  She will call to get patient rescheduled

## 2021-02-10 ENCOUNTER — Other Ambulatory Visit: Payer: Self-pay | Admitting: Oncology

## 2021-02-10 ENCOUNTER — Other Ambulatory Visit: Payer: PPO

## 2021-02-15 ENCOUNTER — Other Ambulatory Visit: Payer: PPO

## 2021-02-16 DIAGNOSIS — C323 Malignant neoplasm of laryngeal cartilage: Secondary | ICD-10-CM | POA: Diagnosis not present

## 2021-02-18 ENCOUNTER — Encounter: Payer: Self-pay | Admitting: Oncology

## 2021-02-22 ENCOUNTER — Telehealth: Payer: Self-pay | Admitting: Oncology

## 2021-02-22 NOTE — Telephone Encounter (Signed)
Per Dr Bobby Rumpf, patient will not need 11/30 Labs since he had Labs drawn on 11/22.  Relayed Information to Ms Bullins

## 2021-02-23 ENCOUNTER — Other Ambulatory Visit: Payer: Self-pay

## 2021-02-23 ENCOUNTER — Other Ambulatory Visit: Payer: PPO

## 2021-02-23 NOTE — Progress Notes (Incomplete)
[facility]  [address] [place],  [place]  [address] [phone]  Clinic Day:  03/02/2021  Referring physician: Janith Lima, MD  This document serves as a record of services personally performed by Dequincy Macarthur Critchley, MD. It was created on their behalf by [facility] E, a trained medical scribe. The creation of this record is based on the scribe's personal observations and the provider's statements to them.  HISTORY OF PRESENT ILLNESS:  The patient is a 75 y.o. male  who I recently began seeing for newly diagnosed laryngeal cancer.  The patient claims he dealt with a hoarse voice for 4 months that never improved with allergy or other other-the-counter medications.  He was sent to ENT for further evaluation.  A laryngoscopy was done in October 2022, which revealed a left laryngeal mass.  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  He also underwent a PET scan earlier this week, which showed this mass to be hypermetabolic.  Also seen was a 6 mm hypermetabolic left level 2A cervical lymph node, consistent with nodal metastasis.  He comes in today to go over his biopsy and scan results, as well as their implications.  He claims he has lost 15 pounds over the past 6 weeks.  He did smoke heavily for 20 years, but has not smoked in over 40 years.    PHYSICAL EXAM:  There were no vitals taken for this visit. Wt Readings from Last 3 Encounters:  02/03/21 142 lb 14.4 oz (64.8 kg)  01/14/21 149 lb 0.5 oz (67.6 kg)  11/10/20 162 lb (73.5 kg)   There is no height or weight on file to calculate BMI. Performance status (ECOG): 1 - Symptomatic but completely ambulatory Physical Exam Constitutional:      Appearance: Normal appearance. He is not ill-appearing.     Comments: He does appear moderately short of breath from his underlying tumor  HENT:     Mouth/Throat:     Mouth: Mucous  membranes are moist.     Pharynx: Oropharynx is clear. No oropharyngeal exudate or posterior oropharyngeal erythema.  Cardiovascular:     Rate and Rhythm: Normal rate and regular rhythm.     Heart sounds: No murmur heard.   No friction rub. No gallop.  Pulmonary:     Effort: Pulmonary effort is normal. No respiratory distress.     Breath sounds: Normal breath sounds. No wheezing, rhonchi or rales.  Abdominal:     General: Bowel sounds are normal. There is no distension.     Palpations: Abdomen is soft. There is no mass.     Tenderness: There is no abdominal tenderness.  Musculoskeletal:        General: No swelling.     Right lower leg: No edema.     Left lower leg: No edema.  Lymphadenopathy:     Cervical: No cervical adenopathy.     Upper Body:     Right upper body: No supraclavicular or axillary adenopathy.     Left upper body: No supraclavicular or axillary adenopathy.     Lower Body: No right inguinal adenopathy. No left inguinal adenopathy.  Skin:    General: Skin is warm.     Coloration: Skin is not jaundiced.     Findings: No lesion or rash.  Neurological:     General: No focal deficit present.     Mental Status: He is alert and oriented to person,  place, and time. Mental status is at baseline.  Psychiatric:        Mood and Affect: Mood normal.        Behavior: Behavior normal.        Thought Content: Thought content normal.   LABS:   CBC Latest Ref Rng & Units 02/03/2021 11/10/2020 06/02/2020  WBC - 10.5 6.7 6.5  Hemoglobin 13.5 - 17.5 16.1 16.3 16.5  Hematocrit 41 - 53 48 48.2 48.7  Platelets 150 - 399 161 134.0(L) 132.0(L)   CMP Latest Ref Rng & Units 02/03/2021 01/11/2021 11/10/2020  Glucose 70 - 99 mg/dL - 95 91  BUN 4 - 21 24(A) 14 17  Creatinine 0.6 - 1.3 0.8 0.89 1.11  Sodium 137 - 147 141 140 141  Potassium 3.4 - 5.3 4.2 4.3 4.8  Chloride 99 - 108 102 106 106  CO2 13 - 22 30(A) 27 28  Calcium 8.7 - 10.7 9.4 9.5 10.2  Total Protein 6.0 - 8.3 g/dL - - -   Total Bilirubin 0.2 - 1.2 mg/dL - - -  Alkaline Phos 25 - 125 94 - -  AST 14 - 40 26 - -  ALT 10 - 40 22 - -   STUDIES:  CT SOFT TISSUE NECK W CONTRAST  Result Date: 02/03/2021 CLINICAL DATA:  Vocal cord mass, and new diagnosis. EXAM: CT NECK WITH CONTRAST TECHNIQUE: Multidetector CT imaging of the neck was performed using the standard protocol following the bolus administration of intravenous contrast. CONTRAST:  42mL ISOVUE-300 IOPAMIDOL (ISOVUE-300) INJECTION 61% COMPARISON:  None. FINDINGS: Pharynx and larynx: Left laryngeal mass at the level of the glottis and supraglottic larynx with tumor measuring approximately 22 x 29 mm on axial images and extending from the anterior commissure to posterior commissure. There is left paraglottic fat invasion. No cartilage transgression. The left arytenoid is sclerotic and smaller than the right. Small internal laryngocele on the left. Salivary glands: No inflammation, mass, or stone. Thyroid: Normal. Lymph nodes: None enlarged or abnormal density. Vascular: Scattered atheromatous calcification Limited intracranial: Negative Visualized orbits: Bilateral cataract resection Mastoids and visualized paranasal sinuses: Clear Skeleton: Generalized cervical spine degeneration. Upper chest: Azygous fissure.  No nodules. 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 No adenopathy. Electronically Signed   By: Jorje Guild M.D.   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 EXAM: NUCLEAR MEDICINE PET SKULL BASE TO THIGH TECHNIQUE: 7.2 mCi F-18 FDG was injected intravenously. Full-ring PET imaging was performed from the skull base to thigh after the radiotracer. CT data was obtained and used for attenuation correction and anatomic  localization. Fasting blood glucose: 100 mg/dl COMPARISON:  None. FINDINGS: Mediastinal blood-pool activity (background): SUV max = 2.8 Liver activity (reference): SUV max = N/A NECK: Irregular soft tissue thickening is seen involving the left vocal cord, which shows intense hypermetabolic activity, with SUV max 21.0. A 6 mm left level 2A lymph node (image 40/4) shows hypermetabolic activity, with SUV max of 3.7. No other hypermetabolic nodes are seen within the neck. Incidental CT findings:  None. CHEST: No hypermetabolic masses or lymphadenopathy. No hypermetabolic pulmonary nodules seen on CT images. A 3 mm nodule is seen in the right upper lobe on image 88/4, and a 4 mm nodule is seen in the  anterior right lower lobe on image 90/4 is. Neither of these show FDG uptake, but they are too small to reliably characterize by PET. Incidental CT findings: Aortic and coronary atherosclerotic calcification noted. Azygous fissure incidentally noted. ABDOMEN/PELVIS: No abnormal hypermetabolic activity within the liver, pancreas, adrenal glands, or spleen. No hypermetabolic lymph nodes in the abdomen or pelvis. Incidental CT findings: Aortic atherosclerotic calcification noted. Mild to moderately enlarged prostate. SKELETON: No focal hypermetabolic bone lesions to suggest skeletal metastasis. Incidental CT findings:  None. IMPRESSION: Hypermetabolic left vocal cord mass, consistent with known primary carcinoma. 6 mm hypermetabolic left level 2A cervical lymph node, consistent with metastatic disease. No definite evidence of metastatic disease within the chest, abdomen, or pelvis. Two tiny less than 5 mm right lung nodules show no FDG uptake, but are too small to characterize by PET. Recommend continued attention on follow-up CT. Electronically Signed   By: Marlaine Hind M.D.   On: 01/31/2021 15:49     ASSESSMENT & PLAN:  A 75 y.o. male with what clinically appears to be stage III (T1 N1 M0) laryngeal cancer.  Based upon  having nodal metastasis, he understands he will need combined modality therapy for his disease management.  As he and his family expressed concerns with how well he could tolerate platinum-based chemoradiation, the mutual decision was made to give him cetuximab, in conjunction with radiation, to treat his disease, with the overall goal still being cure.  The patient was made aware of the side effects that can go along with cetuximab, including severe skin reactions and potential allergic infusional reactions.  I will arrange for him to have a port and feeding tube placed before he commences with therapy.  He will receive a loading dose of cetuximab before his combination therapy commences a week later.  I will see him back in approximately 1 month to see how he is doing with his definitive therapy.  The patient and his wife understand all the plans discussed today and are in agreement with them.  I, Rita Ohara, am acting as scribe for Marice Potter, MD    I have reviewed this report as typed by the medical scribe, and it is complete and accurate.  Dequincy Macarthur Critchley, MD

## 2021-02-24 ENCOUNTER — Ambulatory Visit: Payer: PPO | Admitting: Internal Medicine

## 2021-02-24 ENCOUNTER — Other Ambulatory Visit: Payer: Self-pay | Admitting: Hematology and Oncology

## 2021-02-24 ENCOUNTER — Inpatient Hospital Stay: Payer: PPO | Attending: Hematology and Oncology | Admitting: Hematology and Oncology

## 2021-02-24 ENCOUNTER — Encounter: Payer: Self-pay | Admitting: Hematology and Oncology

## 2021-02-24 ENCOUNTER — Inpatient Hospital Stay: Payer: PPO

## 2021-02-24 VITALS — BP 135/74 | HR 84 | Temp 98.6°F | Resp 18 | Ht 68.0 in | Wt 134.9 lb

## 2021-02-24 DIAGNOSIS — K1230 Oral mucositis (ulcerative), unspecified: Secondary | ICD-10-CM | POA: Insufficient documentation

## 2021-02-24 DIAGNOSIS — C329 Malignant neoplasm of larynx, unspecified: Secondary | ICD-10-CM

## 2021-02-24 DIAGNOSIS — K5900 Constipation, unspecified: Secondary | ICD-10-CM | POA: Insufficient documentation

## 2021-02-24 DIAGNOSIS — G629 Polyneuropathy, unspecified: Secondary | ICD-10-CM | POA: Insufficient documentation

## 2021-02-24 DIAGNOSIS — C328 Malignant neoplasm of overlapping sites of larynx: Secondary | ICD-10-CM | POA: Diagnosis not present

## 2021-02-24 DIAGNOSIS — R197 Diarrhea, unspecified: Secondary | ICD-10-CM | POA: Insufficient documentation

## 2021-02-24 DIAGNOSIS — Z5112 Encounter for antineoplastic immunotherapy: Secondary | ICD-10-CM | POA: Insufficient documentation

## 2021-02-24 LAB — COMPREHENSIVE METABOLIC PANEL
Albumin: 4 (ref 3.5–5.0)
Calcium: 8.9 (ref 8.7–10.7)

## 2021-02-24 LAB — CBC AND DIFFERENTIAL
HCT: 42 (ref 41–53)
Hemoglobin: 14 (ref 13.5–17.5)
Neutrophils Absolute: 6.94
Platelets: 208 (ref 150–399)
WBC: 8.9

## 2021-02-24 LAB — HEPATIC FUNCTION PANEL
ALT: 21 (ref 10–40)
AST: 25 (ref 14–40)
Alkaline Phosphatase: 101 (ref 25–125)
Bilirubin, Total: 0.8

## 2021-02-24 LAB — MAGNESIUM: Magnesium: 2.5 — AB (ref 1.6–2.3)

## 2021-02-24 LAB — BASIC METABOLIC PANEL
BUN: 12 (ref 4–21)
CO2: 30 — AB (ref 13–22)
Chloride: 106 (ref 99–108)
Creatinine: 0.7 (ref 0.6–1.3)
Glucose: 98
Potassium: 3.5 (ref 3.4–5.3)
Sodium: 143 (ref 137–147)

## 2021-02-24 LAB — CBC
MCV: 89 (ref 80–94)
RBC: 4.77 (ref 3.87–5.11)

## 2021-02-24 MED ORDER — PROCHLORPERAZINE MALEATE 10 MG PO TABS
10.0000 mg | ORAL_TABLET | Freq: Four times a day (QID) | ORAL | 3 refills | Status: AC | PRN
Start: 2021-02-24 — End: ?

## 2021-02-24 MED ORDER — ONDANSETRON HCL 4 MG PO TABS: 4.0000 mg | ORAL_TABLET | ORAL | 3 refills | Status: AC | PRN

## 2021-02-24 NOTE — Progress Notes (Signed)
[place] NOTE  Patient Care Team: Janith Lima, MD as PCP - General (Internal Medicine) Raylene Miyamoto, MD as Referring Physician (Otolaryngology) Marice Potter, MD as Consulting Physician (Oncology)   Name of the patient: Jerry Palmer  [id number]  1945-10-26   Date of visit: 02/24/21  Diagnosis- Laryngeal cancer  Chief complaint/Reason for visit- Initial Meeting for [facility], preparing for starting chemotherapy   Heme/Onc history:  Oncology History  Laryngeal cancer ([place])  02/03/2021 Initial Diagnosis   Laryngeal cancer ([place])   02/03/2021 Cancer Staging   Staging form: Larynx - Glottis, AJCC 8th Edition - Clinical stage from 02/03/2021: Stage III (cT1a, cN1, cM0) - Signed by Marice Potter, MD on 02/03/2021 Histopathologic type: Squamous cell carcinoma, NOS Stage prefix: Initial diagnosis    02/25/2021 -  Chemotherapy   Patient is on Treatment Plan : HEAD/NECK Cetuximab q7d       Interval history-  Patient presents to chemo care clinic today for initial meeting in preparation for starting chemotherapy. I introduced the chemo care clinic and we discussed that the role of the clinic is to assist those who are at an increased risk of emergency room visits and/or complications during the course of chemotherapy treatment. We discussed that the increased risk takes into account factors such as age, performance status, and co-morbidities. We also discussed that for some, this might include barriers to care such as not having a primary care provider, lack of insurance/transportation, or not being able to afford medications. We discussed that the goal of the program is to help prevent unplanned ER visits and help reduce complications during chemotherapy. We do this by discussing specific risk factors to each individual and identifying ways that we can help improve these risk factors and reduce barriers to care.   Allergies  Allergen Reactions    Angiotensin Receptor Blockers     Past medical history of angioedema with ACE inhibitor   Lisinopril     Lip swelling   Bactrim [Sulfamethoxazole-Trimethoprim] Rash    Past Medical History:  Diagnosis Date   Cancer ([place])    Basal Cell   Chronic kidney disease    CKD stg 3   Diabetes mellitus without complication (HCC)    Diverticulosis of colon    Fasting hyperglycemia    Hematuria    PMH of   Hyperlipidemia    Hypertension    no meds now   Vocal cord mass     Past Surgical History:  Procedure Laterality Date   CATARACT EXTRACTION  04/06/14   right eye   cataract left eye  1995   COLONOSCOPY W/ POLYPECTOMY      polyp x 1; negative X 2;Dr Sharlett Iles   cyst scalp  2005   MICROLARYNGOSCOPY N/A 01/14/2021   Procedure: MICROLARYNGOSCOPY WITH BIOPSY OF VOCAL CORD MASS;  Surgeon: Leta Baptist, MD;  Location: [place];  Service: ENT;  Laterality: N/A;    Social History   Socioeconomic History   Marital status: Married    Spouse name: Not on file   Number of children: 0   Years of education: 8   Highest education level: Not on file  Occupational History   Not on file  Tobacco Use   Smoking status: Former    Types: Cigarettes    Quit date: 03/27/1978    Years since quitting: 42.9   Smokeless tobacco: Never   Tobacco comments:    smoked [place], up to 1.5  ppd   Vaping Use   Vaping Use: Never used  Substance and Sexual Activity   Alcohol use: No    Alcohol/week: 0.0 standard drinks   Drug use: No   Sexual activity: Not on file  Other Topics Concern   Not on file  Social History Narrative   Fun: Camping    Social Determinants of Health   Financial Resource Strain: Low Risk    Difficulty of Paying Living Expenses: Not hard at all  Food Insecurity: No Food Insecurity   Worried About Charity fundraiser in the Last Year: Never true   Arboriculturist in the Last Year: Never true  Transportation Needs: No Transportation Needs   Lack of  Transportation (Medical): No   Lack of Transportation (Non-Medical): No  Physical Activity: Inactive   Days of Exercise per Week: 0 days   Minutes of Exercise per Session: 0 min  Stress: No Stress Concern Present   Feeling of Stress : Not at all  Social Connections: Socially Integrated   Frequency of Communication with Friends and Family: More than three times a week   Frequency of Social Gatherings with Friends and Family: More than three times a week   Attends Religious Services: More than 4 times per year   Active Member of [employer] or Organizations: No   Attends Music therapist: More than 4 times per year   Marital Status: Married  Human resources officer Violence: Not on file    Family History  Problem Relation Age of Onset   Cancer Brother        ?spinal mets   Colon cancer Brother 73   Colon cancer Brother 79   Colon cancer Brother 53   Breast cancer Mother    Hypertension Mother    Diabetes Sister    Stroke Maternal Grandfather         > 55   Heart disease Neg Hx      Current Outpatient Medications:    aspirin EC 81 MG tablet, Take 1 tablet (81 mg total) by mouth daily., Disp: 90 tablet, Rfl: 3   Blood Glucose Monitoring Suppl (CONTOUR NEXT MONITOR) w/Device KIT, 1 Act by Does not apply route 2 (two) times a day., Disp: 2 kit, Rfl: 0   clonazePAM (KLONOPIN) 0.5 MG tablet, Take by mouth., Disp: , Rfl:    Empagliflozin-metFORMIN HCl (SYNJARDY) 12.5-500 MG TABS, Take 1 tablet by mouth 2 (two) times daily., Disp: 180 tablet, Rfl: 1   glucose blood (CONTOUR NEXT TEST) test strip, USE TO CHECK SUGAR TWICE A DAY. WHAT METER HAVE, Disp: 200 strip, Rfl: 2   levocetirizine (XYZAL) 5 MG tablet, Take 1 tablet (5 mg total) by mouth every evening., Disp: 90 tablet, Rfl: 1   rosuvastatin (CRESTOR) 20 MG tablet, TAKE 1 TABLET BY MOUTH EVERY DAY, Disp: 90 tablet, Rfl: 1  CMP Latest Ref Rng & Units 02/03/2021  Glucose 70 - 99 mg/dL -  BUN 4 - 21 24(A)  Creatinine 0.6 - 1.3 0.8   Sodium 137 - 147 141  Potassium 3.4 - 5.3 4.2  Chloride 99 - 108 102  CO2 13 - 22 30(A)  Calcium 8.7 - 10.7 9.4  Total Protein 6.0 - 8.3 g/dL -  Total Bilirubin 0.2 - 1.2 mg/dL -  Alkaline Phos 25 - 125 94  AST 14 - 40 26  ALT 10 - 40 22   CBC Latest Ref Rng & Units 02/03/2021  WBC - 10.5  Hemoglobin  13.5 - 17.5 16.1  Hematocrit 41 - 53 48  Platelets 150 - 399 161    No images are attached to the encounter.  CT SOFT TISSUE NECK W CONTRAST  Result Date: 02/03/2021 CLINICAL DATA:  Vocal cord mass, and new diagnosis. EXAM: CT NECK WITH CONTRAST TECHNIQUE: Multidetector CT imaging of the neck was performed using the standard protocol following the bolus administration of intravenous contrast. CONTRAST:  64m ISOVUE-300 IOPAMIDOL (ISOVUE-300) INJECTION 61% COMPARISON:  None. FINDINGS: Pharynx and larynx: Left laryngeal mass at the level of the glottis and supraglottic larynx with tumor measuring approximately 22 x 29 mm on axial images and extending from the anterior commissure to posterior commissure. There is left paraglottic fat invasion. No cartilage transgression. The left arytenoid is sclerotic and smaller than the right. Small internal laryngocele on the left. Salivary glands: No inflammation, mass, or stone. Thyroid: Normal. Lymph nodes: None enlarged or abnormal density. Vascular: Scattered atheromatous calcification Limited intracranial: Negative Visualized orbits: Bilateral cataract resection Mastoids and visualized paranasal sinuses: Clear Skeleton: Generalized cervical spine degeneration. Upper chest: Azygous fissure.  No nodules. 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 No adenopathy. Electronically Signed   By: JJorje GuildM.D.   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 EXAM: NUCLEAR MEDICINE PET SKULL BASE TO THIGH TECHNIQUE: 7.2 mCi F-18 FDG was injected intravenously. Full-ring PET imaging was performed from the skull base to thigh after the radiotracer. CT data was obtained and used for attenuation correction and anatomic localization. Fasting blood glucose: 100 mg/dl COMPARISON:  None. FINDINGS: Mediastinal blood-pool activity (background): SUV max = 2.8 Liver activity (reference): SUV max = N/A NECK: Irregular soft tissue thickening is seen involving the left vocal cord, which shows intense hypermetabolic activity, with SUV max 21.0. A 6 mm left level 2A lymph node (image 40/4) shows hypermetabolic activity, with SUV max of 3.7. No other hypermetabolic nodes are seen within the neck. Incidental CT findings:  None. CHEST: No hypermetabolic masses or lymphadenopathy. No hypermetabolic pulmonary nodules seen on CT images. A 3 mm nodule is seen in the right upper lobe on image 88/4, and a 4 mm nodule is seen in the anterior right lower lobe on image 90/4 is. Neither of these show FDG uptake, but they are too small to reliably characterize by PET. Incidental CT findings: Aortic and coronary atherosclerotic calcification noted. Azygous fissure incidentally noted. ABDOMEN/PELVIS: No abnormal hypermetabolic activity within the liver, pancreas, adrenal glands, or spleen. No hypermetabolic lymph nodes in the abdomen or pelvis. Incidental CT findings: Aortic atherosclerotic calcification noted. Mild to moderately enlarged prostate. SKELETON: No focal hypermetabolic bone lesions to suggest skeletal metastasis. Incidental CT findings:  None. IMPRESSION: Hypermetabolic left vocal cord mass, consistent with known primary carcinoma. 6 mm hypermetabolic left level 2A cervical lymph node, consistent with metastatic disease. No definite evidence of metastatic disease within the chest, abdomen, or pelvis. Two tiny less than 5 mm right lung nodules show no  FDG uptake, but are too small to characterize by PET. Recommend continued attention on follow-up CT. Electronically Signed   By: JMarlaine HindM.D.   On: 01/31/2021 15:49     Assessment and plan- [facility] is a 75y.[facility]. [facility] starting chemotherapy for the treatment of laryngeal cancer.   Chemo Care Clinic/High Risk for ER/Hospitalization during chemotherapy- We discussed the role of the chemo care clinic and identified patient specific risk factors. I discussed that patient was identified as high risk primarily based on:  Patient has past medical history positive for: Past Medical History:  Diagnosis Date   Cancer ([place])    Basal Cell   Chronic kidney disease    CKD stg 3   Diabetes mellitus without complication ([place])    Diverticulosis of colon    Fasting hyperglycemia    Hematuria    PMH of   Hyperlipidemia    Hypertension    no meds now   Vocal cord mass     Patient has past surgical history positive for: Past Surgical History:  Procedure Laterality Date   CATARACT EXTRACTION  04/06/14   right eye   cataract left eye  1995   COLONOSCOPY W/ POLYPECTOMY      polyp x 1; negative X 2;Dr Sharlett Iles   cyst scalp  2005   MICROLARYNGOSCOPY N/A 01/14/2021   Procedure: MICROLARYNGOSCOPY WITH BIOPSY OF VOCAL CORD MASS;  Surgeon: Leta Baptist, MD;  Location: [place];  Service: ENT;  Laterality: N/A;    Provided general information including the following: 1.  Date of education: 02/24/2021 2.  Physician name: Dr. Bobby Rumpf 3.  Diagnosis: Laryngeal Cancer 4.  Stage: stage III 5.  Curative 6.  Chemotherapy plan including drugs and how often: Cetuximab IV every 7 days 7.  Start date: 03/02/2021 8.  Other referrals: None at this time 9.  The patient is to call our office with any questions or concerns.  Our office number [phone], if after hours or on the weekend, call the same number and wait for the  answering service.  There is always an oncologist on call 10.  Medications prescribed: 11.  The patient has verbalized understanding of the treatment plan and has no barriers to adherence or understanding.  Obtained signed consent from patient.  Discussed symptoms including 1.  Low blood counts including red blood cells, white blood cells and platelets. 2. Infection including to avoid large crowds, wash hands frequently, and stay away from people who were sick.  If fever develops of 100.4 or higher, call our office. 3.  Mucositis-given instructions on mouth rinse (baking soda and salt mixture).  Keep mouth clean.  Use soft bristle toothbrush.  If mouth sores develop, call our clinic. 4.  Nausea/vomiting-gave prescriptions for ondansetron 4 mg every 4 hours as needed for nausea, may take around the clock if persistent.  Compazine 10 mg every 6 hours, may take around the clock if persistent. 5.  Diarrhea-use over-the-counter Imodium.  Call clinic if not controlled. 6.  Constipation-use senna, 1 to 2 tablets twice a day.  If no BM in 2 to 3 days call the clinic. 7.  Loss of appetite-try to eat small meals every 2-3 hours.  Call clinic if not eating. 8.  Taste changes-zinc 500 mg daily.  If becomes severe call clinic. 9.  Alcoholic beverages. 10.  Drink 2 to 3 quarts of water per day. 11.  Peripheral neuropathy-patient to call if numbness or tingling in hands or feet is persistent   Answered questions to patient satisfaction.  Patient is to call with any further questions or concerns.   The medication prescribed to the patient will be printed out from [email or web address] This will give the following information: Name of your  medication Approved uses Dose and schedule Storage and handling Handling body fluids and waste Drug and food interactions Possible side effects and management Pregnancy, sexual activity, and contraception Obtaining medication   We discussed that social  determinants of health may have significant impacts on health and outcomes for cancer patients.  Today we discussed specific social determinants of performance status, alcohol use, depression, financial needs, food insecurity, housing, interpersonal violence, social connections, stress, tobacco use, and transportation.    After lengthy discussion the following were identified as areas of need:   Outpatient services: We discussed options including home based and outpatient services, DME and care program. We discusssed that patients who participate in regular physical activity report fewer negative impacts of cancer and treatments and report less fatigue.   Financial Concerns: We discussed that living with cancer can create tremendous financial burden.  We discussed options for assistance. I asked that if assistance is needed in affording medications or paying bills to please let us know so that we can provide assistance.   Referral to Social work: Introduced Education officer, museum Mort Sawyers and the services she can provide such as support with utility bill, cell phone and gas vouchers.   Support groups: We discussed options for support groups at the cancer center. If interested, please notify nurse navigator to enroll. We discussed options for managing stress including healthy eating, exercise as well as participating in no charge counseling services at the cancer center and support groups.  If these are of interest, patient can notify either myself or primary nursing team.We discussed options for management including medications and referral to quit Smart program  Transportation: We discussed options for transportation.  I have notified primary oncology team who will help assist with arranging Lucianne Lei transportation for appointments when/if needed. We also discussed options for transportation on short notice/acute visits.  Palliative care services: We have palliative care services available in the cancer center  to discuss goals of care and advanced care planning.  Please let us know if you have any questions or would like to speak to our palliative nurse practitioner.  Symptom Management Clinic: We discussed our symptom management clinic which is available for acute concerns while receiving treatment such as nausea, vomiting or diarrhea.  We can be reached via telephone at [phone] or through my chart.  We are available for virtual or in person visits on the same day from 830 to 4 PM Monday through Friday. He denies needing specific assistance at this time and He will be followed by Dr. Bobby Rumpf clinical team.  Plan: Discussed symptom management clinic. Discussed palliative care services. Discussed resources that are available here at the cancer center. Discussed medications and new prescriptions to begin treatment such as anti-nausea or steroids.   Disposition: RTC on   Visit Diagnosis 1. Laryngeal cancer [facility])     Patient expressed understanding and was in agreement with this plan. He also understands that He can call clinic at any time with any questions, concerns, or complaints.   I provided 30 minutes of  face to face  during this encounter, and > 50% was spent counseling as documented under my assessment & plan.   Dayton Scrape, FNP- [facility]

## 2021-02-25 ENCOUNTER — Telehealth: Payer: Self-pay | Admitting: Hematology and Oncology

## 2021-02-25 NOTE — Telephone Encounter (Signed)
Notified patient's POA of scheduled Dec/Jan Appt's.  She has access to MyChart - She can see the Appt's

## 2021-02-28 LAB — MISC LABCORP TEST (SEND OUT): Labcorp test code: 650001

## 2021-03-01 ENCOUNTER — Encounter: Payer: Self-pay | Admitting: Oncology

## 2021-03-01 MED FILL — Famotidine in NaCl 0.9% IV Soln 20 MG/50ML: INTRAVENOUS | Qty: 100 | Status: AC

## 2021-03-02 ENCOUNTER — Other Ambulatory Visit: Payer: PPO

## 2021-03-02 ENCOUNTER — Ambulatory Visit: Payer: PPO | Admitting: Oncology

## 2021-03-02 ENCOUNTER — Other Ambulatory Visit: Payer: Self-pay

## 2021-03-02 ENCOUNTER — Inpatient Hospital Stay: Payer: PPO

## 2021-03-02 VITALS — BP 131/71 | HR 83 | Temp 98.1°F | Resp 18 | Ht 68.0 in | Wt 136.0 lb

## 2021-03-02 DIAGNOSIS — C329 Malignant neoplasm of larynx, unspecified: Secondary | ICD-10-CM

## 2021-03-02 DIAGNOSIS — K1230 Oral mucositis (ulcerative), unspecified: Secondary | ICD-10-CM | POA: Diagnosis not present

## 2021-03-02 DIAGNOSIS — G629 Polyneuropathy, unspecified: Secondary | ICD-10-CM | POA: Diagnosis not present

## 2021-03-02 DIAGNOSIS — R197 Diarrhea, unspecified: Secondary | ICD-10-CM | POA: Diagnosis not present

## 2021-03-02 DIAGNOSIS — Z5112 Encounter for antineoplastic immunotherapy: Secondary | ICD-10-CM | POA: Diagnosis not present

## 2021-03-02 DIAGNOSIS — K5900 Constipation, unspecified: Secondary | ICD-10-CM | POA: Diagnosis not present

## 2021-03-02 MED ORDER — SODIUM CHLORIDE 0.9% FLUSH
10.0000 mL | INTRAVENOUS | Status: DC | PRN
  Administered 2021-03-02: 10 mL

## 2021-03-02 MED ORDER — SODIUM CHLORIDE 0.9 % IV SOLN: Freq: Once | INTRAVENOUS | Status: AC

## 2021-03-02 MED ORDER — METHYLPREDNISOLONE SODIUM SUCC 125 MG IJ SOLR
125.0000 mg | Freq: Once | INTRAMUSCULAR | Status: AC | PRN
  Administered 2021-03-02: 62.5 mg via INTRAVENOUS

## 2021-03-02 MED ORDER — HEPARIN SOD (PORK) LOCK FLUSH 100 UNIT/ML IV SOLN
500.0000 [IU] | Freq: Once | INTRAVENOUS | Status: AC | PRN
  Administered 2021-03-02: 500 [IU]

## 2021-03-02 MED ORDER — FAMOTIDINE IN NACL 20-0.9 MG/50ML-% IV SOLN
20.0000 mg | Freq: Once | INTRAVENOUS | Status: AC | PRN
  Administered 2021-03-02: 20 mg via INTRAVENOUS

## 2021-03-02 MED ORDER — FAMOTIDINE 20 MG IN NS 100 ML IVPB
20.0000 mg | Freq: Once | INTRAVENOUS | Status: AC
  Administered 2021-03-02: 20 mg via INTRAVENOUS
  Filled 2021-03-02: qty 20

## 2021-03-02 MED ORDER — DIPHENHYDRAMINE HCL 50 MG/ML IJ SOLN
50.0000 mg | Freq: Once | INTRAMUSCULAR | Status: AC
  Administered 2021-03-02: 50 mg via INTRAVENOUS
  Filled 2021-03-02: qty 1

## 2021-03-02 MED ORDER — EMPTY CONTAINERS FLEXIBLE MISC
400.0000 mg/m2 | Freq: Once | Status: AC
  Administered 2021-03-02: 700 mg via INTRAVENOUS
  Filled 2021-03-02: qty 300

## 2021-03-02 NOTE — Patient Instructions (Signed)
Cetuximab injection What is this medication? CETUXIMAB (se TUX i mab) is a monoclonal antibody. It is used to treat colorectal cancer and head and neck cancer. This medicine may be used for other purposes; ask your health care provider or pharmacist if you have questions. COMMON BRAND NAME(S): Erbitux What should I tell my care team before I take this medication? They need to know if you have any of these conditions: heart disease history of irregular heartbeat history of low levels of calcium, magnesium, or potassium in the blood history of tick bites lung or breathing disease, like asthma red meat allergy an unusual or allergic reaction to cetuximab, other medicines, foods, dyes, or preservatives pregnant or trying to get pregnant breast-feeding How should I use this medication? This drug is given as an infusion into a vein. It is administered in a hospital or clinic by a specially trained health care professional. Talk to your pediatrician regarding the use of this medicine in children. Special care may be needed. Overdosage: If you think you have taken too much of this medicine contact a poison control center or emergency room at once. NOTE: This medicine is only for you. Do not share this medicine with others. What if I miss a dose? It is important not to miss your dose. Call your doctor or health care professional if you are unable to keep an appointment. What may interact with this medication? Interactions are not expected. This list may not describe all possible interactions. Give your health care provider a list of all the medicines, herbs, non-prescription drugs, or dietary supplements you use. Also tell them if you smoke, drink alcohol, or use illegal drugs. Some items may interact with your medicine. What should I watch for while using this medication? Visit your doctor or health care professional for regular checks on your progress. This drug may make you feel generally  unwell. This is not uncommon, as chemotherapy can affect healthy cells as well as cancer cells. Report any side effects. Continue your course of treatment even though you feel ill unless your doctor tells you to stop. This medicine can make you more sensitive to the sun. Keep out of the sun while taking this medicine and for 2 months after the last dose. If you cannot avoid being in the sun, wear protective clothing and use sunscreen. Do not use sun lamps or tanning beds/booths. You may need blood work done while you are taking this medicine. In some cases, you may be given additional medicines to help with side effects. Follow all directions for their use. Call your doctor or health care professional for advice if you get a fever, chills or sore throat, or other symptoms of a cold or flu. Do not treat yourself. This drug decreases your body's ability to fight infections. Try to avoid being around people who are sick. Avoid taking products that contain aspirin, acetaminophen, ibuprofen, naproxen, or ketoprofen unless instructed by your doctor. These medicines may hide a fever. Do not become pregnant while taking this medicine. Women should inform their doctor if they wish to become pregnant or think they might be pregnant. There is a potential for serious side effects to an unborn child. Use adequate birth control methods. Avoid pregnancy for at least 2 months after your last dose. Talk to your health care professional or pharmacist for more information. Do not breast-feed an infant while taking this medicine or during the 2 months after your last dose. What side effects may I notice from  receiving this medication? Side effects that you should report to your doctor or health care professional as soon as possible: allergic reactions like skin rash, itching or hives, swelling of the face, lips, or tongue breathing problems changes in vision fast, irregular heartbeat feeling faint or lightheaded,  falls fever, chills mouth sores redness, blistering, peeling or loosening of the skin, including inside the mouth trouble passing urine or change in the amount of urine unusually weak or tired Side effects that usually do not require medical attention (report to your doctor or health care professional if they continue or are bothersome): changes in skin like acne, cracks, skin dryness constipation diarrhea headache nail changes nausea, vomiting stomach upset weight loss This list may not describe all possible side effects. Call your doctor for medical advice about side effects. You may report side effects to FDA at 1-800-FDA-1088. Where should I keep my medication? This drug is given in a hospital or clinic and will not be stored at home. NOTE: This sheet is a summary. It may not cover all possible information. If you have questions about this medicine, talk to your doctor, pharmacist, or health care provider.  2022 Elsevier/Gold Standard (2020-11-30 00:00:00)

## 2021-03-02 NOTE — Progress Notes (Signed)
Hypersensitivity Reaction note  Date of event: 03/02/21 Time of event: 1020 Generic name of drug involved: cetuximab Name of provider notified of the hypersensitivity reaction: SUSAN PHY, PHARMD Was agent that likely caused hypersensitivity reaction added to Allergies List within EMR? YES Chain of events including reaction signs/symptoms, treatment administered, and outcome (e.g., drug resumed; drug discontinued; sent to Emergency Department; etc.) 20 MINUTES INTO INFUSION, PT DEVELOPED REDNESS IN FACE AND RED ITCHY EYES.  ERBITUX STOPPED AND ADDITIONAL PEPCID AND SOLUMEDROL GIVEN.  COMPLETE RESOLUTION OF SYMPTOMS BY 1047.  [place] RESUMED AND COMPLETED WITHOUT FURTHER INCIDENT  Jerry Ahmadi, RN 03/02/2021 4:01 PM

## 2021-03-03 ENCOUNTER — Encounter: Payer: Self-pay | Admitting: Oncology

## 2021-03-03 ENCOUNTER — Telehealth: Payer: Self-pay

## 2021-03-03 NOTE — Telephone Encounter (Addendum)
03/03/21 - I spoke with pt to see how he is doing after first infusion yesterday. He denies  N/V, fever, diarrhea, headache, SOB, or sore mouth. I reminded him that skin & nail changes may occur (usually occur in 2 weeks). It is important that he doesn't apply any any acne medication to those areas. ERBITUX can make pt very sensitive to light, so when he is outside he needs to wear a large brimmed hat, long sleeve shirt, and/or sun screen. Pt states that someone had told him about the light sensitivity, which is great. Pt encouraged to call us if he develops temp of 100.4 or higher, DAY OR NIGHT. He verbalized understanding.

## 2021-03-04 ENCOUNTER — Encounter: Payer: Self-pay | Admitting: Oncology

## 2021-03-04 ENCOUNTER — Other Ambulatory Visit: Payer: PPO

## 2021-03-04 ENCOUNTER — Ambulatory Visit: Payer: PPO | Admitting: Oncology

## 2021-03-07 ENCOUNTER — Encounter: Payer: Self-pay | Admitting: Hematology and Oncology

## 2021-03-07 ENCOUNTER — Inpatient Hospital Stay: Payer: PPO

## 2021-03-07 ENCOUNTER — Inpatient Hospital Stay (INDEPENDENT_AMBULATORY_CARE_PROVIDER_SITE_OTHER): Payer: PPO | Admitting: Hematology and Oncology

## 2021-03-07 DIAGNOSIS — C329 Malignant neoplasm of larynx, unspecified: Secondary | ICD-10-CM

## 2021-03-07 DIAGNOSIS — D649 Anemia, unspecified: Secondary | ICD-10-CM | POA: Diagnosis not present

## 2021-03-07 DIAGNOSIS — J387 Other diseases of larynx: Secondary | ICD-10-CM | POA: Diagnosis not present

## 2021-03-07 LAB — BASIC METABOLIC PANEL
BUN: 13 (ref 4–21)
CO2: 29 — AB (ref 13–22)
Chloride: 108 (ref 99–108)
Creatinine: 0.7 (ref 0.6–1.3)
Glucose: 105
Potassium: 3.5 (ref 3.4–5.3)
Sodium: 141 (ref 137–147)

## 2021-03-07 LAB — CBC AND DIFFERENTIAL
HCT: 40 — AB (ref 41–53)
Hemoglobin: 13.4 — AB (ref 13.5–17.5)
Neutrophils Absolute: 4.15
Platelets: 164 (ref 150–399)
WBC: 6.1

## 2021-03-07 LAB — MAGNESIUM: Magnesium: 2.4

## 2021-03-07 LAB — COMPREHENSIVE METABOLIC PANEL
Albumin: 3.9 (ref 3.5–5.0)
Calcium: 8.2 — AB (ref 8.7–10.7)

## 2021-03-07 LAB — HEPATIC FUNCTION PANEL
ALT: 34 (ref 10–40)
AST: 30 (ref 14–40)
Alkaline Phosphatase: 90 (ref 25–125)
Bilirubin, Total: 0.8

## 2021-03-07 LAB — CBC: RBC: 4.53 (ref 3.87–5.11)

## 2021-03-07 NOTE — Assessment & Plan Note (Addendum)
He is tolerating treatment well with minimal side effects. He reports noticing that his breathing has improved since his first treatment. CBC and CMP are unremarkable. He will proceed with cycle 2 this week and return to clinic in one week for repeat evaluation prior to cycle 3.

## 2021-03-07 NOTE — Progress Notes (Signed)
Patient Care Team: Janith Lima, MD as PCP - General (Internal Medicine) Raylene Miyamoto, MD as Referring Physician (Otolaryngology) Marice Potter, MD as Consulting Physician (Oncology)  Clinic Day:  03/07/2021  Referring physician: Janith Lima, MD  ASSESSMENT & PLAN:   Assessment & Plan: Laryngeal cancer [facility]) He is tolerating treatment well with minimal side effects. He reports noticing that his breathing has improved since his first treatment. CBC and CMP are unremarkable. He will proceed with cycle 2 this week and return to clinic in one week for repeat evaluation prior to cycle 3.     The patient understands the plans discussed today and is in agreement with them.  He knows to contact our office if he develops concerns prior to his next appointment.    Melodye Ped, NP  [facility] [address] [place] [address] Dept: [phone] Dept Fax: [phone]   Orders Placed This Encounter  Procedures   CBC and differential    This external order was created through the Results Console.   CBC    This external order was created through the Results Console.   Basic metabolic panel    This external order was created through the Results Console.   Comprehensive metabolic panel    This external order was created through the Results Console.   Hepatic function panel    This external order was created through the Results Console.   Magnesium    This order was created through External Result Entry      CHIEF COMPLAINT:  CC: A 75 year old male with history of laryngeal cancer here for 1 week evaluation prior to cycle 2 treatment  Current Treatment:  Cetuximab  INTERVAL HISTORY:  Jerry Palmer is here today for repeat clinical assessment. He denies fevers or chills. He denies pain. His appetite is good. His weight has been stable.  I have reviewed the past medical history, past surgical history,  social history and family history with the patient and they are unchanged from previous note.  ALLERGIES:  is allergic to angiotensin receptor blockers, lisinopril, bactrim [sulfamethoxazole-trimethoprim], and cetuximab.  MEDICATIONS:  Current Outpatient Medications  Medication Sig Dispense Refill   aspirin EC 81 MG tablet Take 1 tablet (81 mg total) by mouth daily. 90 tablet 3   Blood Glucose Monitoring Suppl (CONTOUR NEXT MONITOR) w/Device KIT 1 Act by Does not apply route 2 (two) times a day. 2 kit 0   clonazePAM (KLONOPIN) 0.5 MG tablet Take by mouth.     Empagliflozin-metFORMIN HCl (SYNJARDY) 12.5-500 MG TABS Take 1 tablet by mouth 2 (two) times daily. 180 tablet 1   glucose blood (CONTOUR NEXT TEST) test strip USE TO CHECK SUGAR TWICE A DAY. WHAT METER HAVE 200 strip 2   levocetirizine (XYZAL) 5 MG tablet Take 1 tablet (5 mg total) by mouth every evening. 90 tablet 1   ondansetron (ZOFRAN) 4 MG tablet Take 1 tablet (4 mg total) by mouth every 4 (four) hours as needed for nausea. 90 tablet 3   prochlorperazine (COMPAZINE) 10 MG tablet Take 1 tablet (10 mg total) by mouth every 6 (six) hours as needed for nausea or vomiting. 90 tablet 3   rosuvastatin (CRESTOR) 20 MG tablet TAKE 1 TABLET BY MOUTH EVERY DAY 90 tablet 1   No current facility-administered medications for this visit.    HISTORY OF PRESENT ILLNESS:   Oncology History  Laryngeal cancer ([facility])  02/03/2021 Initial  Diagnosis   Laryngeal cancer ([facility])   02/03/2021 Cancer Staging   Staging form: Larynx - Glottis, AJCC 8th Edition - Clinical stage from 02/03/2021: Stage III (cT1a, cN1, cM0) - Signed by Marice Potter, MD on 02/03/2021 Histopathologic type: Squamous cell carcinoma, NOS Stage prefix: Initial diagnosis    03/02/2021 -  Chemotherapy   Patient is on Treatment Plan : HEAD/NECK Cetuximab q7d         REVIEW OF SYSTEMS:   Constitutional: Denies fevers, chills or abnormal weight loss Eyes: Denies blurriness  of vision Ears, nose, mouth, throat, and face: Denies mucositis or sore throat Respiratory: Denies cough, dyspnea or wheezes Cardiovascular: Denies palpitation, chest discomfort or lower extremity swelling Gastrointestinal:  Denies nausea, heartburn or change in bowel habits Skin: Denies abnormal skin rashes Lymphatics: Denies new lymphadenopathy or easy bruising Neurological:Denies numbness, tingling or new weaknesses Behavioral/Psych: Mood is stable, no new changes  All other systems were reviewed with the patient and are negative.   VITALS:  Blood pressure (!) 152/72, pulse 76, temperature 98.3 F (36.8 C), resp. rate 16, height '5\' 8"'  (1.727 m), weight 137 lb 8 oz (62.4 kg), SpO2 100 %.  Wt Readings from Last 3 Encounters:  03/07/21 137 lb 8 oz (62.4 kg)  03/02/21 136 lb (61.7 kg)  02/24/21 134 lb 14.4 oz (61.2 kg)    Body mass index is 20.91 kg/m.  Performance status (ECOG): 1 - Symptomatic but completely ambulatory  PHYSICAL EXAM:   GENERAL:alert, no distress and comfortable SKIN: skin color, texture, turgor are normal, no rashes or significant lesions EYES: normal, Conjunctiva are pink and non-injected, sclera clear OROPHARYNX:no exudate, no erythema and lips, buccal mucosa, and tongue normal  NECK: supple, thyroid normal size, non-tender, without nodularity LYMPH:  no palpable lymphadenopathy in the cervical, axillary or inguinal LUNGS: clear to auscultation and percussion with normal breathing effort HEART: regular rate & rhythm and no murmurs and no lower extremity edema ABDOMEN:abdomen soft, non-tender and normal bowel sounds Musculoskeletal:no cyanosis of digits and no clubbing  NEURO: alert & oriented x 3 with fluent speech, no focal motor/sensory deficits  LABORATORY DATA:  I have reviewed the data as listed    Component Value Date/Time   NA 141 03/07/2021 0000   K 3.5 03/07/2021 0000   CL 108 03/07/2021 0000   CO2 29 (A) 03/07/2021 0000   GLUCOSE 95  01/11/2021 1307   BUN 13 03/07/2021 0000   CREATININE 0.7 03/07/2021 0000   CREATININE 0.89 01/11/2021 1307   CALCIUM 8.2 (A) 03/07/2021 0000   PROT 7.2 06/02/2020 1102   ALBUMIN 3.9 03/07/2021 0000   AST 30 03/07/2021 0000   ALT 34 03/07/2021 0000   ALKPHOS 90 03/07/2021 0000   BILITOT 0.9 06/02/2020 1102   GFRNONAA >60 01/11/2021 1307   GFRAA 97 03/08/2007 0000    No results found for: SPEP, UPEP  Lab Results  Component Value Date   WBC 6.1 03/07/2021   NEUTROABS 4.15 03/07/2021   HGB 13.4 (A) 03/07/2021   HCT 40 (A) 03/07/2021   MCV 89 02/24/2021   PLT 164 03/07/2021      Chemistry      Component Value Date/Time   NA 141 03/07/2021 0000   K 3.5 03/07/2021 0000   CL 108 03/07/2021 0000   CO2 29 (A) 03/07/2021 0000   BUN 13 03/07/2021 0000   CREATININE 0.7 03/07/2021 0000   CREATININE 0.89 01/11/2021 1307   GLU 105 03/07/2021 0000  Component Value Date/Time   CALCIUM 8.2 (A) 03/07/2021 0000   ALKPHOS 90 03/07/2021 0000   AST 30 03/07/2021 0000   ALT 34 03/07/2021 0000   BILITOT 0.9 06/02/2020 1102       RADIOGRAPHIC STUDIES: I have personally reviewed the radiological images as listed and agreed with the findings in the report. No results found.

## 2021-03-08 DIAGNOSIS — Z510 Encounter for antineoplastic radiation therapy: Secondary | ICD-10-CM | POA: Diagnosis not present

## 2021-03-08 DIAGNOSIS — C328 Malignant neoplasm of overlapping sites of larynx: Secondary | ICD-10-CM | POA: Diagnosis not present

## 2021-03-08 MED FILL — Famotidine in NaCl 0.9% IV Soln 20 MG/50ML: INTRAVENOUS | Qty: 100 | Status: AC

## 2021-03-08 MED FILL — Cetuximab IV Soln 200 MG/100ML (2 MG/ML): INTRAVENOUS | Qty: 200 | Status: AC

## 2021-03-09 ENCOUNTER — Inpatient Hospital Stay: Payer: PPO

## 2021-03-09 ENCOUNTER — Other Ambulatory Visit: Payer: Self-pay

## 2021-03-09 VITALS — BP 119/64 | HR 72 | Temp 98.2°F | Resp 18 | Ht 68.0 in | Wt 139.0 lb

## 2021-03-09 DIAGNOSIS — Z5112 Encounter for antineoplastic immunotherapy: Secondary | ICD-10-CM | POA: Diagnosis not present

## 2021-03-09 DIAGNOSIS — C328 Malignant neoplasm of overlapping sites of larynx: Secondary | ICD-10-CM | POA: Diagnosis not present

## 2021-03-09 DIAGNOSIS — C329 Malignant neoplasm of larynx, unspecified: Secondary | ICD-10-CM | POA: Diagnosis not present

## 2021-03-09 DIAGNOSIS — Z510 Encounter for antineoplastic radiation therapy: Secondary | ICD-10-CM | POA: Diagnosis not present

## 2021-03-09 DIAGNOSIS — J387 Other diseases of larynx: Secondary | ICD-10-CM | POA: Diagnosis not present

## 2021-03-09 MED ORDER — SODIUM CHLORIDE 0.9% FLUSH
10.0000 mL | INTRAVENOUS | Status: DC | PRN
  Administered 2021-03-09: 10 mL

## 2021-03-09 MED ORDER — HEPARIN SOD (PORK) LOCK FLUSH 100 UNIT/ML IV SOLN
500.0000 [IU] | Freq: Once | INTRAVENOUS | Status: AC | PRN
  Administered 2021-03-09: 500 [IU]

## 2021-03-09 MED ORDER — METHYLPREDNISOLONE SODIUM SUCC 125 MG IJ SOLR
62.5000 mg | Freq: Once | INTRAMUSCULAR | Status: AC
  Administered 2021-03-09: 62.5 mg via INTRAVENOUS
  Filled 2021-03-09: qty 2

## 2021-03-09 MED ORDER — EMPTY CONTAINERS FLEXIBLE MISC
250.0000 mg/m2 | Freq: Once | Status: AC
  Administered 2021-03-09: 400 mg via INTRAVENOUS
  Filled 2021-03-09: qty 200

## 2021-03-09 MED ORDER — DIPHENHYDRAMINE HCL 50 MG/ML IJ SOLN
25.0000 mg | Freq: Once | INTRAMUSCULAR | Status: AC
  Administered 2021-03-09: 25 mg via INTRAVENOUS
  Filled 2021-03-09: qty 1

## 2021-03-09 MED ORDER — FAMOTIDINE 20 MG IN NS 100 ML IVPB
20.0000 mg | Freq: Once | INTRAVENOUS | Status: AC
  Administered 2021-03-09: 20 mg via INTRAVENOUS
  Filled 2021-03-09: qty 20

## 2021-03-09 MED ORDER — SODIUM CHLORIDE 0.9 % IV SOLN: Freq: Once | INTRAVENOUS | Status: AC

## 2021-03-09 NOTE — Patient Instructions (Signed)
Cetuximab injection What is this medication? CETUXIMAB (se TUX i mab) is a monoclonal antibody. It is used to treat colorectal cancer and head and neck cancer. This medicine may be used for other purposes; ask your health care provider or pharmacist if you have questions. COMMON BRAND NAME(S): Erbitux What should I tell my care team before I take this medication? They need to know if you have any of these conditions: heart disease history of irregular heartbeat history of low levels of calcium, magnesium, or potassium in the blood history of tick bites lung or breathing disease, like asthma red meat allergy an unusual or allergic reaction to cetuximab, other medicines, foods, dyes, or preservatives pregnant or trying to get pregnant breast-feeding How should I use this medication? This drug is given as an infusion into a vein. It is administered in a hospital or clinic by a specially trained health care professional. Talk to your pediatrician regarding the use of this medicine in children. Special care may be needed. Overdosage: If you think you have taken too much of this medicine contact a poison control center or emergency room at once. NOTE: This medicine is only for you. Do not share this medicine with others. What if I miss a dose? It is important not to miss your dose. Call your doctor or health care professional if you are unable to keep an appointment. What may interact with this medication? Interactions are not expected. This list may not describe all possible interactions. Give your health care provider a list of all the medicines, herbs, non-prescription drugs, or dietary supplements you use. Also tell them if you smoke, drink alcohol, or use illegal drugs. Some items may interact with your medicine. What should I watch for while using this medication? Visit your doctor or health care professional for regular checks on your progress. This drug may make you feel generally  unwell. This is not uncommon, as chemotherapy can affect healthy cells as well as cancer cells. Report any side effects. Continue your course of treatment even though you feel ill unless your doctor tells you to stop. This medicine can make you more sensitive to the sun. Keep out of the sun while taking this medicine and for 2 months after the last dose. If you cannot avoid being in the sun, wear protective clothing and use sunscreen. Do not use sun lamps or tanning beds/booths. You may need blood work done while you are taking this medicine. In some cases, you may be given additional medicines to help with side effects. Follow all directions for their use. Call your doctor or health care professional for advice if you get a fever, chills or sore throat, or other symptoms of a cold or flu. Do not treat yourself. This drug decreases your body's ability to fight infections. Try to avoid being around people who are sick. Avoid taking products that contain aspirin, acetaminophen, ibuprofen, naproxen, or ketoprofen unless instructed by your doctor. These medicines may hide a fever. Do not become pregnant while taking this medicine. Women should inform their doctor if they wish to become pregnant or think they might be pregnant. There is a potential for serious side effects to an unborn child. Use adequate birth control methods. Avoid pregnancy for at least 2 months after your last dose. Talk to your health care professional or pharmacist for more information. Do not breast-feed an infant while taking this medicine or during the 2 months after your last dose. What side effects may I notice from  receiving this medication? Side effects that you should report to your doctor or health care professional as soon as possible: allergic reactions like skin rash, itching or hives, swelling of the face, lips, or tongue breathing problems changes in vision fast, irregular heartbeat feeling faint or lightheaded,  falls fever, chills mouth sores redness, blistering, peeling or loosening of the skin, including inside the mouth trouble passing urine or change in the amount of urine unusually weak or tired Side effects that usually do not require medical attention (report to your doctor or health care professional if they continue or are bothersome): changes in skin like acne, cracks, skin dryness constipation diarrhea headache nail changes nausea, vomiting stomach upset weight loss This list may not describe all possible side effects. Call your doctor for medical advice about side effects. You may report side effects to FDA at 1-800-FDA-1088. Where should I keep my medication? This drug is given in a hospital or clinic and will not be stored at home. NOTE: This sheet is a summary. It may not cover all possible information. If you have questions about this medicine, talk to your doctor, pharmacist, or health care provider.  2022 Elsevier/Gold Standard (2020-11-30 00:00:00)

## 2021-03-10 ENCOUNTER — Telehealth: Payer: Self-pay | Admitting: Oncology

## 2021-03-10 ENCOUNTER — Other Ambulatory Visit: Payer: Self-pay | Admitting: Internal Medicine

## 2021-03-10 DIAGNOSIS — C328 Malignant neoplasm of overlapping sites of larynx: Secondary | ICD-10-CM | POA: Diagnosis not present

## 2021-03-10 DIAGNOSIS — E118 Type 2 diabetes mellitus with unspecified complications: Secondary | ICD-10-CM

## 2021-03-10 NOTE — Progress Notes (Signed)
[facility]  [address] [place],  [place]  [address] [phone]  Clinic Day:  03/11/2021  Referring physician: Janith Lima, MD  This document serves as a record of services personally performed by Gerad Cornelio Macarthur Critchley, MD. It was created on their behalf by [facility] E, a trained medical scribe. The creation of this record is based on the scribe's personal observations and the provider's statements to them.  HISTORY OF PRESENT ILLNESS:  The patient is a 75 y.o. male with stage III (T1 N1 M0) laryngeal cancer.   He is in the early stages of his definitive therapy, which includes weekly cetuximab/daily radiation.  Of note, since his last visit, the patient needed an emergent tracheostomy, which was done in [place].  Since this was placed, he no longer complains of shortness of breath.  He has already received his loading dose and first week of cetuximab.  Unsurprisingly, he has developed an acneiform rash over his face due to this agent.  Otherwise, the patient has been doing fine with both his cetuximab and radiation.  PHYSICAL EXAM:  Blood pressure 124/67, pulse 75, temperature 98.3 F (36.8 C), resp. rate 16, height 5\' 8"  (1.727 m), weight 139 lb 9.6 oz (63.3 kg), SpO2 99 %. Wt Readings from Last 3 Encounters:  03/14/21 139 lb 9.6 oz (63.3 kg)  03/09/21 139 lb (63 kg)  03/07/21 137 lb 8 oz (62.4 kg)   Body mass index is 21.23 kg/m. Performance status (ECOG): 1 - Symptomatic but completely ambulatory Physical Exam Constitutional:      Appearance: Normal appearance. He is not ill-appearing.  HENT:     Mouth/Throat:     Mouth: Mucous membranes are moist.     Pharynx: Oropharynx is clear. No oropharyngeal exudate or posterior oropharyngeal erythema.  Neck:     Trachea: Tracheostomy present.  Cardiovascular:     Rate and Rhythm: Normal rate and regular rhythm.     Heart sounds: No murmur heard.   No friction rub. No gallop.  Pulmonary:      Effort: Pulmonary effort is normal. No respiratory distress.     Breath sounds: Normal breath sounds. No wheezing, rhonchi or rales.  Abdominal:     General: Bowel sounds are normal. There is no distension.     Palpations: Abdomen is soft. There is no mass.     Tenderness: There is no abdominal tenderness.  Musculoskeletal:        General: No swelling.     Right lower leg: No edema.     Left lower leg: No edema.  Lymphadenopathy:     Cervical: No cervical adenopathy.     Upper Body:     Right upper body: No supraclavicular or axillary adenopathy.     Left upper body: No supraclavicular or axillary adenopathy.     Lower Body: No right inguinal adenopathy. No left inguinal adenopathy.  Skin:    General: Skin is warm.     Coloration: Skin is not jaundiced.     Findings: Acne (primarily over his cheeks and nose) present. No lesion or rash.  Neurological:     General: No focal deficit present.     Mental Status: He is alert and oriented to person, place, and time. Mental status is at baseline.  Psychiatric:        Mood and Affect: Mood normal.        Behavior: Behavior normal.        Thought Content: Thought  content normal.   LABS:   CBC Latest Ref Rng & Units 03/14/2021 03/07/2021 02/24/2021  WBC - 10.5 6.1 8.9  Hemoglobin 13.5 - 17.5 13.7 13.4(A) 14.0  Hematocrit 41 - 53 42 40(A) 42  Platelets 150 - 399 138(A) 164 208   CMP Latest Ref Rng & Units 03/14/2021 03/07/2021 02/24/2021  Glucose 70 - 99 mg/dL - - -  BUN 4 - 21 11 13 12   Creatinine 0.6 - 1.3 0.7 0.7 0.7  Sodium 137 - 147 140 141 143  Potassium 3.4 - 5.3 3.1(A) 3.5 3.5  Chloride 99 - 108 106 108 106  CO2 13 - 22 27(A) 29(A) 30(A)  Calcium 8.7 - 10.7 8.5(A) 8.2(A) 8.9  Total Protein 6.0 - 8.3 g/dL - - -  Total Bilirubin 0.2 - 1.2 mg/dL - - -  Alkaline Phos 25 - 125 85 90 101  AST 14 - 40 22 30 25   ALT 10 - 40 27 34 21   ASSESSMENT & PLAN:  A 75 y.o. male with stage III (T1 N1 M0) laryngeal cancer.  He will  proceed with his weekly dose of cetuximab this week, which he appears to be tolerating fairly well.  As he does have an acneiform rash from his cetuximab, he will be placed on doxycycline 100 mg twice daily.  He also knows to use urea-based lotion to keep his skin adequately moisturized.  Clinically, he appears to be doing well.  I will see him back in 2 weeks for repeat clinical assessment.  The patient and his wife understand all the plans discussed today and are in agreement with them.  I, Rita Ohara, am acting as scribe for Marice Potter, MD    I have reviewed this report as typed by the medical scribe, and it is complete and accurate.  Dequavious Harshberger Macarthur Critchley, MD

## 2021-03-10 NOTE — Telephone Encounter (Signed)
Patient's caregiver called to rescheduled 12/19 Labs, Follow Up closer to Radiation Appt

## 2021-03-11 ENCOUNTER — Other Ambulatory Visit: Payer: Self-pay | Admitting: Oncology

## 2021-03-11 ENCOUNTER — Encounter: Payer: Self-pay | Admitting: Oncology

## 2021-03-11 DIAGNOSIS — C328 Malignant neoplasm of overlapping sites of larynx: Secondary | ICD-10-CM | POA: Diagnosis not present

## 2021-03-14 ENCOUNTER — Other Ambulatory Visit: Payer: Self-pay | Admitting: Oncology

## 2021-03-14 ENCOUNTER — Inpatient Hospital Stay (INDEPENDENT_AMBULATORY_CARE_PROVIDER_SITE_OTHER): Payer: PPO | Admitting: Oncology

## 2021-03-14 ENCOUNTER — Ambulatory Visit: Payer: PPO | Admitting: Oncology

## 2021-03-14 ENCOUNTER — Other Ambulatory Visit: Payer: Self-pay | Admitting: Hematology and Oncology

## 2021-03-14 ENCOUNTER — Other Ambulatory Visit: Payer: PPO

## 2021-03-14 ENCOUNTER — Inpatient Hospital Stay: Payer: PPO

## 2021-03-14 VITALS — BP 124/67 | HR 75 | Temp 98.3°F | Resp 16 | Ht 68.0 in | Wt 139.6 lb

## 2021-03-14 DIAGNOSIS — Z930 Tracheostomy status: Secondary | ICD-10-CM | POA: Diagnosis not present

## 2021-03-14 DIAGNOSIS — C329 Malignant neoplasm of larynx, unspecified: Secondary | ICD-10-CM

## 2021-03-14 DIAGNOSIS — C328 Malignant neoplasm of overlapping sites of larynx: Secondary | ICD-10-CM | POA: Diagnosis not present

## 2021-03-14 DIAGNOSIS — J387 Other diseases of larynx: Secondary | ICD-10-CM | POA: Diagnosis not present

## 2021-03-14 LAB — COMPREHENSIVE METABOLIC PANEL
Albumin: 3.8 (ref 3.5–5.0)
Calcium: 8.5 — AB (ref 8.7–10.7)

## 2021-03-14 LAB — CBC AND DIFFERENTIAL
HCT: 42 (ref 41–53)
Hemoglobin: 13.7 (ref 13.5–17.5)
Neutrophils Absolute: 8.72
Platelets: 138 — AB (ref 150–399)
WBC: 10.5

## 2021-03-14 LAB — BASIC METABOLIC PANEL
BUN: 11 (ref 4–21)
CO2: 27 — AB (ref 13–22)
Chloride: 106 (ref 99–108)
Creatinine: 0.7 (ref 0.6–1.3)
Glucose: 141
Potassium: 3.1 — AB (ref 3.4–5.3)
Sodium: 140 (ref 137–147)

## 2021-03-14 LAB — MAGNESIUM: Magnesium: 2.4

## 2021-03-14 LAB — HEPATIC FUNCTION PANEL
ALT: 27 (ref 10–40)
AST: 22 (ref 14–40)
Alkaline Phosphatase: 85 (ref 25–125)
Bilirubin, Total: 1

## 2021-03-14 LAB — CBC: RBC: 4.73 (ref 3.87–5.11)

## 2021-03-14 MED ORDER — DOXYCYCLINE HYCLATE 100 MG PO TABS: 100.0000 mg | ORAL_TABLET | Freq: Two times a day (BID) | ORAL | 1 refills | Status: AC

## 2021-03-15 ENCOUNTER — Other Ambulatory Visit: Payer: Self-pay | Admitting: Pharmacist

## 2021-03-15 DIAGNOSIS — E876 Hypokalemia: Secondary | ICD-10-CM

## 2021-03-15 DIAGNOSIS — C328 Malignant neoplasm of overlapping sites of larynx: Secondary | ICD-10-CM | POA: Diagnosis not present

## 2021-03-15 MED ORDER — POTASSIUM CHLORIDE CRYS ER 20 MEQ PO TBCR
20.0000 meq | EXTENDED_RELEASE_TABLET | Freq: Every day | ORAL | 0 refills | Status: AC
Start: 2021-03-15 — End: 2021-03-20

## 2021-03-15 NOTE — Progress Notes (Signed)
Left vm at [facility] for Klor-Con 45meq tablets x 5 days.

## 2021-03-16 ENCOUNTER — Other Ambulatory Visit: Payer: Self-pay

## 2021-03-16 ENCOUNTER — Inpatient Hospital Stay: Payer: PPO

## 2021-03-16 DIAGNOSIS — C329 Malignant neoplasm of larynx, unspecified: Secondary | ICD-10-CM

## 2021-03-16 DIAGNOSIS — C328 Malignant neoplasm of overlapping sites of larynx: Secondary | ICD-10-CM | POA: Diagnosis not present

## 2021-03-16 DIAGNOSIS — J387 Other diseases of larynx: Secondary | ICD-10-CM | POA: Diagnosis not present

## 2021-03-16 DIAGNOSIS — Z5112 Encounter for antineoplastic immunotherapy: Secondary | ICD-10-CM | POA: Diagnosis not present

## 2021-03-16 MED ORDER — METHYLPREDNISOLONE SODIUM SUCC 125 MG IJ SOLR
62.5000 mg | Freq: Once | INTRAMUSCULAR | Status: AC
  Administered 2021-03-16: 62.5 mg via INTRAVENOUS
  Filled 2021-03-16: qty 2

## 2021-03-16 MED ORDER — SODIUM CHLORIDE 0.9% FLUSH
10.0000 mL | INTRAVENOUS | Status: DC | PRN
  Administered 2021-03-16: 10 mL

## 2021-03-16 MED ORDER — EMPTY CONTAINERS FLEXIBLE MISC
250.0000 mg/m2 | Freq: Once | Status: AC
  Administered 2021-03-16: 400 mg via INTRAVENOUS
  Filled 2021-03-16: qty 200

## 2021-03-16 MED ORDER — DIPHENHYDRAMINE HCL 50 MG/ML IJ SOLN
25.0000 mg | Freq: Once | INTRAMUSCULAR | Status: AC
  Administered 2021-03-16: 25 mg via INTRAVENOUS
  Filled 2021-03-16: qty 1

## 2021-03-16 MED ORDER — HEPARIN SOD (PORK) LOCK FLUSH 100 UNIT/ML IV SOLN
500.0000 [IU] | Freq: Once | INTRAVENOUS | Status: AC | PRN
  Administered 2021-03-16: 500 [IU]

## 2021-03-16 MED ORDER — SODIUM CHLORIDE 0.9 % IV SOLN: Freq: Once | INTRAVENOUS | Status: AC

## 2021-03-16 MED ORDER — FAMOTIDINE 20 MG IN NS 100 ML IVPB
20.0000 mg | Freq: Once | INTRAVENOUS | Status: AC
  Administered 2021-03-16: 20 mg via INTRAVENOUS
  Filled 2021-03-16: qty 20

## 2021-03-16 NOTE — Patient Instructions (Signed)
Cetuximab injection What is this medication? CETUXIMAB (se TUX i mab) is a monoclonal antibody. It is used to treat colorectal cancer and head and neck cancer. This medicine may be used for other purposes; ask your health care provider or pharmacist if you have questions. COMMON BRAND NAME(S): Erbitux What should I tell my care team before I take this medication? They need to know if you have any of these conditions: heart disease history of irregular heartbeat history of low levels of calcium, magnesium, or potassium in the blood history of tick bites lung or breathing disease, like asthma red meat allergy an unusual or allergic reaction to cetuximab, other medicines, foods, dyes, or preservatives pregnant or trying to get pregnant breast-feeding How should I use this medication? This drug is given as an infusion into a vein. It is administered in a hospital or clinic by a specially trained health care professional. Talk to your pediatrician regarding the use of this medicine in children. Special care may be needed. Overdosage: If you think you have taken too much of this medicine contact a poison control center or emergency room at once. NOTE: This medicine is only for you. Do not share this medicine with others. What if I miss a dose? It is important not to miss your dose. Call your doctor or health care professional if you are unable to keep an appointment. What may interact with this medication? Interactions are not expected. This list may not describe all possible interactions. Give your health care provider a list of all the medicines, herbs, non-prescription drugs, or dietary supplements you use. Also tell them if you smoke, drink alcohol, or use illegal drugs. Some items may interact with your medicine. What should I watch for while using this medication? Visit your doctor or health care professional for regular checks on your progress. This drug may make you feel generally  unwell. This is not uncommon, as chemotherapy can affect healthy cells as well as cancer cells. Report any side effects. Continue your course of treatment even though you feel ill unless your doctor tells you to stop. This medicine can make you more sensitive to the sun. Keep out of the sun while taking this medicine and for 2 months after the last dose. If you cannot avoid being in the sun, wear protective clothing and use sunscreen. Do not use sun lamps or tanning beds/booths. You may need blood work done while you are taking this medicine. In some cases, you may be given additional medicines to help with side effects. Follow all directions for their use. Call your doctor or health care professional for advice if you get a fever, chills or sore throat, or other symptoms of a cold or flu. Do not treat yourself. This drug decreases your body's ability to fight infections. Try to avoid being around people who are sick. Avoid taking products that contain aspirin, acetaminophen, ibuprofen, naproxen, or ketoprofen unless instructed by your doctor. These medicines may hide a fever. Do not become pregnant while taking this medicine. Women should inform their doctor if they wish to become pregnant or think they might be pregnant. There is a potential for serious side effects to an unborn child. Use adequate birth control methods. Avoid pregnancy for at least 2 months after your last dose. Talk to your health care professional or pharmacist for more information. Do not breast-feed an infant while taking this medicine or during the 2 months after your last dose. What side effects may I notice from  receiving this medication? Side effects that you should report to your doctor or health care professional as soon as possible: allergic reactions like skin rash, itching or hives, swelling of the face, lips, or tongue breathing problems changes in vision fast, irregular heartbeat feeling faint or lightheaded,  falls fever, chills mouth sores redness, blistering, peeling or loosening of the skin, including inside the mouth trouble passing urine or change in the amount of urine unusually weak or tired Side effects that usually do not require medical attention (report to your doctor or health care professional if they continue or are bothersome): changes in skin like acne, cracks, skin dryness constipation diarrhea headache nail changes nausea, vomiting stomach upset weight loss This list may not describe all possible side effects. Call your doctor for medical advice about side effects. You may report side effects to FDA at 1-800-FDA-1088. Where should I keep my medication? This drug is given in a hospital or clinic and will not be stored at home. NOTE: This sheet is a summary. It may not cover all possible information. If you have questions about this medicine, talk to your doctor, pharmacist, or health care provider.  2022 Elsevier/Gold Standard (2020-11-30 00:00:00)

## 2021-03-17 DIAGNOSIS — C328 Malignant neoplasm of overlapping sites of larynx: Secondary | ICD-10-CM | POA: Diagnosis not present

## 2021-03-18 DIAGNOSIS — C323 Malignant neoplasm of laryngeal cartilage: Secondary | ICD-10-CM | POA: Diagnosis not present

## 2021-03-18 DIAGNOSIS — C328 Malignant neoplasm of overlapping sites of larynx: Secondary | ICD-10-CM | POA: Diagnosis not present

## 2021-03-22 ENCOUNTER — Inpatient Hospital Stay: Payer: PPO

## 2021-03-22 ENCOUNTER — Encounter: Payer: Self-pay | Admitting: Oncology

## 2021-03-22 DIAGNOSIS — C329 Malignant neoplasm of larynx, unspecified: Secondary | ICD-10-CM

## 2021-03-22 DIAGNOSIS — J387 Other diseases of larynx: Secondary | ICD-10-CM | POA: Diagnosis not present

## 2021-03-23 ENCOUNTER — Other Ambulatory Visit: Payer: Self-pay

## 2021-03-23 ENCOUNTER — Telehealth: Payer: Self-pay | Admitting: Dietician

## 2021-03-23 ENCOUNTER — Inpatient Hospital Stay: Payer: PPO

## 2021-03-23 ENCOUNTER — Telehealth: Payer: Self-pay | Admitting: Oncology

## 2021-03-23 VITALS — BP 142/61 | HR 83 | Temp 98.2°F | Resp 18 | Ht 68.0 in | Wt 142.0 lb

## 2021-03-23 DIAGNOSIS — C328 Malignant neoplasm of overlapping sites of larynx: Secondary | ICD-10-CM | POA: Diagnosis not present

## 2021-03-23 DIAGNOSIS — Z5112 Encounter for antineoplastic immunotherapy: Secondary | ICD-10-CM | POA: Diagnosis not present

## 2021-03-23 DIAGNOSIS — C329 Malignant neoplasm of larynx, unspecified: Secondary | ICD-10-CM

## 2021-03-23 MED ORDER — SODIUM CHLORIDE 0.9 % IV SOLN: Freq: Once | INTRAVENOUS | Status: AC

## 2021-03-23 MED ORDER — EMPTY CONTAINERS FLEXIBLE MISC
250.0000 mg/m2 | Freq: Once | Status: AC
  Administered 2021-03-23: 400 mg via INTRAVENOUS
  Filled 2021-03-23: qty 100

## 2021-03-23 MED ORDER — FAMOTIDINE 20 MG IN NS 100 ML IVPB
20.0000 mg | Freq: Once | INTRAVENOUS | Status: AC
  Administered 2021-03-23: 20 mg via INTRAVENOUS
  Filled 2021-03-23: qty 20

## 2021-03-23 MED ORDER — DIPHENHYDRAMINE HCL 50 MG/ML IJ SOLN
25.0000 mg | Freq: Once | INTRAMUSCULAR | Status: AC
  Administered 2021-03-23: 25 mg via INTRAVENOUS
  Filled 2021-03-23: qty 1

## 2021-03-23 MED ORDER — METHYLPREDNISOLONE SODIUM SUCC 125 MG IJ SOLR
62.5000 mg | Freq: Once | INTRAMUSCULAR | Status: AC
  Administered 2021-03-23: 62.5 mg via INTRAVENOUS
  Filled 2021-03-23: qty 2

## 2021-03-23 MED ORDER — HEPARIN SOD (PORK) LOCK FLUSH 100 UNIT/ML IV SOLN
500.0000 [IU] | Freq: Once | INTRAVENOUS | Status: AC | PRN
  Administered 2021-03-23: 500 [IU]

## 2021-03-23 MED ORDER — SODIUM CHLORIDE 0.9% FLUSH
10.0000 mL | INTRAVENOUS | Status: DC | PRN
  Administered 2021-03-23: 10 mL

## 2021-03-23 NOTE — Telephone Encounter (Signed)
Tried calling patient at his home number after treatments today.  Reached his voice mail. Introduced my self and asked for him to return the call.  April Manson, RDN, LDN Registered Dietitian, [facility] Part Time Remote (Usual office hours: Tuesday-Thursday) Cell: [phone]

## 2021-03-23 NOTE — Patient Instructions (Signed)
Cetuximab injection What is this medication? CETUXIMAB (se TUX i mab) is a monoclonal antibody. It is used to treat colorectal cancer and head and neck cancer. This medicine may be used for other purposes; ask your health care provider or pharmacist if you have questions. COMMON BRAND NAME(S): Erbitux What should I tell my care team before I take this medication? They need to know if you have any of these conditions: heart disease history of irregular heartbeat history of low levels of calcium, magnesium, or potassium in the blood history of tick bites lung or breathing disease, like asthma red meat allergy an unusual or allergic reaction to cetuximab, other medicines, foods, dyes, or preservatives pregnant or trying to get pregnant breast-feeding How should I use this medication? This drug is given as an infusion into a vein. It is administered in a hospital or clinic by a specially trained health care professional. Talk to your pediatrician regarding the use of this medicine in children. Special care may be needed. Overdosage: If you think you have taken too much of this medicine contact a poison control center or emergency room at once. NOTE: This medicine is only for you. Do not share this medicine with others. What if I miss a dose? It is important not to miss your dose. Call your doctor or health care professional if you are unable to keep an appointment. What may interact with this medication? Interactions are not expected. This list may not describe all possible interactions. Give your health care provider a list of all the medicines, herbs, non-prescription drugs, or dietary supplements you use. Also tell them if you smoke, drink alcohol, or use illegal drugs. Some items may interact with your medicine. What should I watch for while using this medication? Visit your doctor or health care professional for regular checks on your progress. This drug may make you feel generally  unwell. This is not uncommon, as chemotherapy can affect healthy cells as well as cancer cells. Report any side effects. Continue your course of treatment even though you feel ill unless your doctor tells you to stop. This medicine can make you more sensitive to the sun. Keep out of the sun while taking this medicine and for 2 months after the last dose. If you cannot avoid being in the sun, wear protective clothing and use sunscreen. Do not use sun lamps or tanning beds/booths. You may need blood work done while you are taking this medicine. In some cases, you may be given additional medicines to help with side effects. Follow all directions for their use. Call your doctor or health care professional for advice if you get a fever, chills or sore throat, or other symptoms of a cold or flu. Do not treat yourself. This drug decreases your body's ability to fight infections. Try to avoid being around people who are sick. Avoid taking products that contain aspirin, acetaminophen, ibuprofen, naproxen, or ketoprofen unless instructed by your doctor. These medicines may hide a fever. Do not become pregnant while taking this medicine. Women should inform their doctor if they wish to become pregnant or think they might be pregnant. There is a potential for serious side effects to an unborn child. Use adequate birth control methods. Avoid pregnancy for at least 2 months after your last dose. Talk to your health care professional or pharmacist for more information. Do not breast-feed an infant while taking this medicine or during the 2 months after your last dose. What side effects may I notice from  receiving this medication? Side effects that you should report to your doctor or health care professional as soon as possible: allergic reactions like skin rash, itching or hives, swelling of the face, lips, or tongue breathing problems changes in vision fast, irregular heartbeat feeling faint or lightheaded,  falls fever, chills mouth sores redness, blistering, peeling or loosening of the skin, including inside the mouth trouble passing urine or change in the amount of urine unusually weak or tired Side effects that usually do not require medical attention (report to your doctor or health care professional if they continue or are bothersome): changes in skin like acne, cracks, skin dryness constipation diarrhea headache nail changes nausea, vomiting stomach upset weight loss This list may not describe all possible side effects. Call your doctor for medical advice about side effects. You may report side effects to FDA at 1-800-FDA-1088. Where should I keep my medication? This drug is given in a hospital or clinic and will not be stored at home. NOTE: This sheet is a summary. It may not cover all possible information. If you have questions about this medicine, talk to your doctor, pharmacist, or health care provider.  2022 Elsevier/Gold Standard (2020-11-30 00:00:00)

## 2021-03-23 NOTE — Telephone Encounter (Signed)
I contacted pt to verify insurance for 2023; LVM to contact us back.

## 2021-03-23 NOTE — Telephone Encounter (Signed)
Spoke with sister Marlowe Kays. She relayed best to contact patient on home phone after treatments. He has hard time hearing over his cell.  She stated that she is medical RP his wife has dementia and she can't be relied upon for messages. She stated patient has feeding tube placed but he is only currently using it for FWF at this time. Provided contact information and asked her to let patient know I would try to reach him today.  April Manson, RDN, LDN Registered Dietitian, [facility] Part Time Remote (Usual office hours: Tuesday-Thursday) Cell: [phone]

## 2021-03-24 ENCOUNTER — Encounter: Payer: Self-pay | Admitting: Oncology

## 2021-03-24 ENCOUNTER — Ambulatory Visit: Payer: PPO | Admitting: Oncology

## 2021-03-24 DIAGNOSIS — C328 Malignant neoplasm of overlapping sites of larynx: Secondary | ICD-10-CM | POA: Diagnosis not present

## 2021-03-25 DIAGNOSIS — C328 Malignant neoplasm of overlapping sites of larynx: Secondary | ICD-10-CM | POA: Diagnosis not present

## 2021-03-28 DIAGNOSIS — C328 Malignant neoplasm of overlapping sites of larynx: Secondary | ICD-10-CM | POA: Diagnosis not present

## 2021-03-28 NOTE — Progress Notes (Signed)
[facility]  [address] [place],  [place]  [address] [phone]  Clinic Day:  03/29/2021  Referring physician: Janith Lima, MD  This document serves as a record of services personally performed by Kasten Leveque Macarthur Critchley, MD. It was created on their behalf by [facility] E, a trained medical scribe. The creation of this record is based on the scribe's personal observations and the provider's statements to them.  HISTORY OF PRESENT ILLNESS:  The patient is a 76 y.o. male with stage III (T1 N1 M0) laryngeal cancer.   He is doing definitive therapy, which consists of weekly cetuximab/daily radiation.  Thus far, the patient has tolerated this regimen fairly well.  Although he has redness over his face from his cetuximab therapy, his acne has dissipated in this area since starting his doxycycline.  However, he has noticed more acne over his upper back.  Otherwise, the patient is clinically doing well.  He denies having any new symptoms or findings which concern him with disease progression while on his cetuximab/radiation.  PHYSICAL EXAM:  Blood pressure 134/71, pulse 72, temperature 98 F (36.7 C), resp. rate 16, height 5\' 8"  (1.727 m), weight 132 lb 12.8 oz (60.2 kg), SpO2 97 %. Wt Readings from Last 3 Encounters:  03/29/21 132 lb 12.8 oz (60.2 kg)  03/23/21 142 lb (64.4 kg)  03/14/21 139 lb 9.6 oz (63.3 kg)   Body mass index is 20.19 kg/m. Performance status (ECOG): 1 - Symptomatic but completely ambulatory Physical Exam Constitutional:      Appearance: Normal appearance. He is not ill-appearing.  HENT:     Mouth/Throat:     Mouth: Mucous membranes are moist.     Pharynx: Oropharynx is clear. No oropharyngeal exudate or posterior oropharyngeal erythema.  Neck:     Trachea: Tracheostomy present.  Cardiovascular:     Rate and Rhythm: Normal rate and regular rhythm.     Heart sounds: No murmur heard.   No friction rub. No gallop.  Pulmonary:      Effort: Pulmonary effort is normal. No respiratory distress.     Breath sounds: Normal breath sounds. No wheezing, rhonchi or rales.  Abdominal:     General: Bowel sounds are normal. There is no distension.     Palpations: Abdomen is soft. There is no mass.     Tenderness: There is no abdominal tenderness.  Musculoskeletal:        General: No swelling.     Right lower leg: No edema.     Left lower leg: No edema.  Lymphadenopathy:     Cervical: No cervical adenopathy.     Upper Body:     Right upper body: No supraclavicular or axillary adenopathy.     Left upper body: No supraclavicular or axillary adenopathy.     Lower Body: No right inguinal adenopathy. No left inguinal adenopathy.  Skin:    General: Skin is warm.     Coloration: Skin is not jaundiced.     Findings: Acne (upper back) and erythema (facial) present. No lesion or rash.  Neurological:     General: No focal deficit present.     Mental Status: He is alert and oriented to person, place, and time. Mental status is at baseline.  Psychiatric:        Mood and Affect: Mood normal.        Behavior: Behavior normal.        Thought Content: Thought content normal.   LABS:  CBC Latest Ref Rng & Units 03/29/2021 03/14/2021 03/07/2021  WBC - 7.2 10.5 6.1  Hemoglobin 13.5 - 17.5 14.3 13.7 13.4(A)  Hematocrit 41 - 53 44 42 40(A)  Platelets 150 - 399 168 138(A) 164   CMP Latest Ref Rng & Units 03/29/2021 03/14/2021 03/07/2021  Glucose 70 - 99 mg/dL - - -  BUN 4 - 21 10 11 13   Creatinine 0.6 - 1.3 0.5(A) 0.7 0.7  Sodium 137 - 147 139 140 141  Potassium 3.4 - 5.3 4.2 3.1(A) 3.5  Chloride 99 - 108 109(A) 106 108  CO2 13 - 22 25(A) 27(A) 29(A)  Calcium 8.7 - 10.7 8.3(A) 8.5(A) 8.2(A)  Total Protein 6.0 - 8.3 g/dL - - -  Total Bilirubin 0.2 - 1.2 mg/dL - - -  Alkaline Phos 25 - 125 100 85 90  AST 14 - 40 23 22 30   ALT 10 - 40 21 27 34   ASSESSMENT & PLAN:  A 76 y.o. male with stage III (T1 N1 M0) laryngeal cancer.  He will  proceed with his weekly dose of cetuximab this week, which he appears to be tolerating fairly well.  This is being given in conjunction with daily radiation he knows to continue taking his doxycycline 100 mg twice daily to address his acne.  He also knows to use urea-based lotion to keep his skin adequately moisturized.  Clinically, he appears to be doing well.  I will see him back in3  weeks for repeat clinical assessment.  The patient and his wife understand all the plans discussed today and are in agreement with them.  I, Rita Ohara, am acting as scribe for Marice Potter, MD    I have reviewed this report as typed by the medical scribe, and it is complete and accurate.  Earma Nicolaou Macarthur Critchley, MD

## 2021-03-29 ENCOUNTER — Inpatient Hospital Stay: Payer: PPO

## 2021-03-29 ENCOUNTER — Other Ambulatory Visit: Payer: Self-pay

## 2021-03-29 ENCOUNTER — Other Ambulatory Visit: Payer: Self-pay | Admitting: Hematology and Oncology

## 2021-03-29 ENCOUNTER — Other Ambulatory Visit: Payer: PPO

## 2021-03-29 ENCOUNTER — Inpatient Hospital Stay: Payer: PPO | Attending: Oncology | Admitting: Oncology

## 2021-03-29 VITALS — BP 134/71 | HR 72 | Temp 98.0°F | Resp 16 | Ht 68.0 in | Wt 132.8 lb

## 2021-03-29 DIAGNOSIS — Z510 Encounter for antineoplastic radiation therapy: Secondary | ICD-10-CM | POA: Diagnosis not present

## 2021-03-29 DIAGNOSIS — C328 Malignant neoplasm of overlapping sites of larynx: Secondary | ICD-10-CM | POA: Diagnosis not present

## 2021-03-29 DIAGNOSIS — C329 Malignant neoplasm of larynx, unspecified: Secondary | ICD-10-CM | POA: Diagnosis not present

## 2021-03-29 DIAGNOSIS — Z5112 Encounter for antineoplastic immunotherapy: Secondary | ICD-10-CM | POA: Insufficient documentation

## 2021-03-29 DIAGNOSIS — Z79899 Other long term (current) drug therapy: Secondary | ICD-10-CM | POA: Insufficient documentation

## 2021-03-29 LAB — CBC AND DIFFERENTIAL
HCT: 44 (ref 41–53)
Hemoglobin: 14.3 (ref 13.5–17.5)
Neutrophils Absolute: 5.69
Platelets: 168 (ref 150–399)
WBC: 7.2

## 2021-03-29 LAB — HEPATIC FUNCTION PANEL
ALT: 21 (ref 10–40)
AST: 23 (ref 14–40)
Alkaline Phosphatase: 100 (ref 25–125)
Bilirubin, Total: 0.7

## 2021-03-29 LAB — BASIC METABOLIC PANEL
BUN: 10 (ref 4–21)
CO2: 25 — AB (ref 13–22)
Chloride: 109 — AB (ref 99–108)
Creatinine: 0.5 — AB (ref 0.6–1.3)
Glucose: 95
Potassium: 4.2 (ref 3.4–5.3)
Sodium: 139 (ref 137–147)

## 2021-03-29 LAB — CBC: RBC: 4.85 (ref 3.87–5.11)

## 2021-03-29 LAB — MAGNESIUM: Magnesium: 2.5

## 2021-03-29 LAB — COMPREHENSIVE METABOLIC PANEL
Albumin: 3.8 (ref 3.5–5.0)
Calcium: 8.3 — AB (ref 8.7–10.7)

## 2021-03-29 MED FILL — Cetuximab IV Soln 200 MG/100ML (2 MG/ML): INTRAVENOUS | Qty: 200 | Status: AC

## 2021-03-30 ENCOUNTER — Inpatient Hospital Stay: Payer: PPO

## 2021-03-30 VITALS — BP 151/70 | HR 86 | Temp 98.5°F | Resp 18 | Ht 68.0 in | Wt 141.0 lb

## 2021-03-30 DIAGNOSIS — C329 Malignant neoplasm of larynx, unspecified: Secondary | ICD-10-CM

## 2021-03-30 DIAGNOSIS — C328 Malignant neoplasm of overlapping sites of larynx: Secondary | ICD-10-CM | POA: Diagnosis not present

## 2021-03-30 DIAGNOSIS — Z79899 Other long term (current) drug therapy: Secondary | ICD-10-CM | POA: Diagnosis not present

## 2021-03-30 DIAGNOSIS — Z5112 Encounter for antineoplastic immunotherapy: Secondary | ICD-10-CM | POA: Diagnosis not present

## 2021-03-30 MED ORDER — METHYLPREDNISOLONE SODIUM SUCC 125 MG IJ SOLR
62.5000 mg | Freq: Once | INTRAMUSCULAR | Status: AC
  Administered 2021-03-30: 62.5 mg via INTRAVENOUS
  Filled 2021-03-30: qty 2

## 2021-03-30 MED ORDER — EMPTY CONTAINERS FLEXIBLE MISC
250.0000 mg/m2 | Freq: Once | Status: AC
  Administered 2021-03-30: 400 mg via INTRAVENOUS
  Filled 2021-03-30: qty 200

## 2021-03-30 MED ORDER — FAMOTIDINE 20 MG IN NS 100 ML IVPB
20.0000 mg | Freq: Once | INTRAVENOUS | Status: AC
  Administered 2021-03-30: 20 mg via INTRAVENOUS
  Filled 2021-03-30: qty 20

## 2021-03-30 MED ORDER — SODIUM CHLORIDE 0.9 % IV SOLN: Freq: Once | INTRAVENOUS | Status: AC

## 2021-03-30 MED ORDER — DIPHENHYDRAMINE HCL 50 MG/ML IJ SOLN
25.0000 mg | Freq: Once | INTRAMUSCULAR | Status: AC
  Administered 2021-03-30: 25 mg via INTRAVENOUS
  Filled 2021-03-30: qty 1

## 2021-03-30 NOTE — Patient Instructions (Signed)
Cetuximab injection What is this medication? CETUXIMAB (se TUX i mab) is a monoclonal antibody. It is used to treat colorectal cancer and head and neck cancer. This medicine may be used for other purposes; ask your health care provider or pharmacist if you have questions. COMMON BRAND NAME(S): Erbitux What should I tell my care team before I take this medication? They need to know if you have any of these conditions: heart disease history of irregular heartbeat history of low levels of calcium, magnesium, or potassium in the blood history of tick bites lung or breathing disease, like asthma red meat allergy an unusual or allergic reaction to cetuximab, other medicines, foods, dyes, or preservatives pregnant or trying to get pregnant breast-feeding How should I use this medication? This drug is given as an infusion into a vein. It is administered in a hospital or clinic by a specially trained health care professional. Talk to your pediatrician regarding the use of this medicine in children. Special care may be needed. Overdosage: If you think you have taken too much of this medicine contact a poison control center or emergency room at once. NOTE: This medicine is only for you. Do not share this medicine with others. What if I miss a dose? It is important not to miss your dose. Call your doctor or health care professional if you are unable to keep an appointment. What may interact with this medication? Interactions are not expected. This list may not describe all possible interactions. Give your health care provider a list of all the medicines, herbs, non-prescription drugs, or dietary supplements you use. Also tell them if you smoke, drink alcohol, or use illegal drugs. Some items may interact with your medicine. What should I watch for while using this medication? Visit your doctor or health care professional for regular checks on your progress. This drug may make you feel generally  unwell. This is not uncommon, as chemotherapy can affect healthy cells as well as cancer cells. Report any side effects. Continue your course of treatment even though you feel ill unless your doctor tells you to stop. This medicine can make you more sensitive to the sun. Keep out of the sun while taking this medicine and for 2 months after the last dose. If you cannot avoid being in the sun, wear protective clothing and use sunscreen. Do not use sun lamps or tanning beds/booths. You may need blood work done while you are taking this medicine. In some cases, you may be given additional medicines to help with side effects. Follow all directions for their use. Call your doctor or health care professional for advice if you get a fever, chills or sore throat, or other symptoms of a cold or flu. Do not treat yourself. This drug decreases your body's ability to fight infections. Try to avoid being around people who are sick. Avoid taking products that contain aspirin, acetaminophen, ibuprofen, naproxen, or ketoprofen unless instructed by your doctor. These medicines may hide a fever. Do not become pregnant while taking this medicine. Women should inform their doctor if they wish to become pregnant or think they might be pregnant. There is a potential for serious side effects to an unborn child. Use adequate birth control methods. Avoid pregnancy for at least 2 months after your last dose. Talk to your health care professional or pharmacist for more information. Do not breast-feed an infant while taking this medicine or during the 2 months after your last dose. What side effects may I notice from  receiving this medication? Side effects that you should report to your doctor or health care professional as soon as possible: allergic reactions like skin rash, itching or hives, swelling of the face, lips, or tongue breathing problems changes in vision fast, irregular heartbeat feeling faint or lightheaded,  falls fever, chills mouth sores redness, blistering, peeling or loosening of the skin, including inside the mouth trouble passing urine or change in the amount of urine unusually weak or tired Side effects that usually do not require medical attention (report to your doctor or health care professional if they continue or are bothersome): changes in skin like acne, cracks, skin dryness constipation diarrhea headache nail changes nausea, vomiting stomach upset weight loss This list may not describe all possible side effects. Call your doctor for medical advice about side effects. You may report side effects to FDA at 1-800-FDA-1088. Where should I keep my medication? This drug is given in a hospital or clinic and will not be stored at home. NOTE: This sheet is a summary. It may not cover all possible information. If you have questions about this medicine, talk to your doctor, pharmacist, or health care provider.  2022 Elsevier/Gold Standard (2020-11-30 00:00:00)

## 2021-04-01 ENCOUNTER — Encounter: Payer: Self-pay | Admitting: Oncology

## 2021-04-01 DIAGNOSIS — C328 Malignant neoplasm of overlapping sites of larynx: Secondary | ICD-10-CM | POA: Diagnosis not present

## 2021-04-04 ENCOUNTER — Inpatient Hospital Stay: Payer: PPO

## 2021-04-04 ENCOUNTER — Other Ambulatory Visit: Payer: Self-pay | Admitting: Hematology and Oncology

## 2021-04-04 DIAGNOSIS — C328 Malignant neoplasm of overlapping sites of larynx: Secondary | ICD-10-CM | POA: Diagnosis not present

## 2021-04-04 DIAGNOSIS — J387 Other diseases of larynx: Secondary | ICD-10-CM | POA: Diagnosis not present

## 2021-04-04 DIAGNOSIS — C329 Malignant neoplasm of larynx, unspecified: Secondary | ICD-10-CM

## 2021-04-04 LAB — MAGNESIUM: Magnesium: 2.6 — AB (ref 1.6–2.3)

## 2021-04-04 LAB — BASIC METABOLIC PANEL
BUN: 16 (ref 4–21)
CO2: 23 — AB (ref 13–22)
Chloride: 106 (ref 99–108)
Creatinine: 0.6 (ref 0.6–1.3)
Glucose: 92
Potassium: 3.8 (ref 3.4–5.3)
Sodium: 138 (ref 137–147)

## 2021-04-04 LAB — COMPREHENSIVE METABOLIC PANEL
Albumin: 4.1 (ref 3.5–5.0)
Calcium: 8.9 (ref 8.7–10.7)

## 2021-04-04 LAB — HEPATIC FUNCTION PANEL
ALT: 20 (ref 10–40)
AST: 22 (ref 14–40)
Alkaline Phosphatase: 96 (ref 25–125)
Bilirubin, Total: 1

## 2021-04-04 LAB — CBC
Absolute Lymphocytes: 0.48 — AB (ref 0.65–4.75)
MCV: 90 (ref 80–94)
RBC: 5.09 (ref 3.87–5.11)

## 2021-04-04 LAB — CBC AND DIFFERENTIAL
HCT: 46 (ref 41–53)
Hemoglobin: 15.3 (ref 13.5–17.5)
Neutrophils Absolute: 5.37
Platelets: 154 (ref 150–399)
WBC: 6.8

## 2021-04-05 ENCOUNTER — Ambulatory Visit: Payer: PPO | Admitting: Dietician

## 2021-04-05 ENCOUNTER — Encounter: Payer: Self-pay | Admitting: Oncology

## 2021-04-05 DIAGNOSIS — C328 Malignant neoplasm of overlapping sites of larynx: Secondary | ICD-10-CM | POA: Diagnosis not present

## 2021-04-05 MED FILL — Cetuximab IV Soln 200 MG/100ML (2 MG/ML): INTRAVENOUS | Qty: 200 | Status: AC

## 2021-04-05 MED FILL — Famotidine in NaCl 0.9% IV Soln 20 MG/50ML: INTRAVENOUS | Qty: 100 | Status: AC

## 2021-04-05 NOTE — Progress Notes (Signed)
Nutrition Assessment   Reason for Assessment: MST screen for weight loss.    ASSESSMENT:   Patient is a 76 year old man with stage III (T1 N1 M0) laryngeal cancer.   He is doing concurrent chemoradiation which consists of weekly Cetuximab/daily radiation.  He is followed by Gatha Mayer MD and Lavera Guise MD. He has a PMHx that includes DM2, HTN, Dyslipidemia, CKD3, Thrombocytopenia  I met with patient and his wife after his visit with Dr. Orlene Erm.  He reports that he is using his PEG now to put Ensure through but only once a day and only about 3-4 "mouthfuls."  He also just started using Metamucil and is thinking about putting that through his PEG because he doesn't like the taste.  He is drinking very little, 3-4oz. of water by mouth and taking Jello. He has no nausea but reports dysgeusia and esophagitis. He is using MMW but only in the morning and getting some relief from mucositis. He still has an appetite and was hoping to enjoy some waffles with strawberries and whipped cream today.  Few concerns with bowels, he reported some occasional harder stools.   Medications: Doxycycline, Zofran, Compazine, Crestor, MMW, and Metamucil   Labs: 04/04/21 Mg 2.6 rising. He reports checking FBG each morning and ranging between 110-120.   Anthropometrics: He has lost 10#, 6.7% past 90 days and 23#, 14% past 5 months  Height: 68" Weight: today at radiation 139 # 03/30/21  141 # 03/29/21  132.8 # 03/23/21 142 #  BMI: 21.44   Estimated Energy Needs  Kcals: 1890-2200 Protein: 76-95 g Fluid: >/= 1900 ml  Goal tube feed: Bolus feeds Jevity 1.5 237 mL six times a day with 180 ml FWF (60 mL before and 120 mL after) with each feed to provide 100% of Estimate energy needs  Goal TF provides 2100 Kcals , 91 g. Protein, 100% DRI vitamins and minerals, 2160 ml H2O (1080 from TF + 1080 ml FWF)   NUTRITION DIAGNOSIS: Inadequate nutrient and fluid intake related to mucositis and esophagitis from his  cancer treatments AEB he usual recall and weight loss.    MALNUTRITION DIAGNOSIS: Suspect severe malnutrition of chronic disease due to weight loss and reported PO intake, unable to perform NFPE   INTERVENTION: Reviewed goals of preserving swallowing function as able by continued use of MMW and soft moist foods as tolerated. Reviewed care of PEG and how to administer bolus feeds.  Reviewed goal rate for TF if he's not able to continue PO intake. Encouraged at minimum to start with 4 cartons to provide 100% DRI vitamins and minerals if he can continue to take in PO meals of soft moist choices such as his Jello, ice cream and applesauce. Provided samples of Jevity 1.5 and written instructions for bolus feeds.    MONITORING, EVALUATION, GOAL: weight trends, PO and enteral intake, TF tolerance, and labs.   Next Visit: Will follow up on TF tolerance by telephone 04/07/21  April Manson, RDN, LDN Registered Dietitian, [place] Part Time Remote (Usual office hours: Tuesday-Thursday) Cell: [phone]

## 2021-04-06 ENCOUNTER — Inpatient Hospital Stay: Payer: PPO

## 2021-04-06 ENCOUNTER — Other Ambulatory Visit: Payer: Self-pay

## 2021-04-06 ENCOUNTER — Encounter: Payer: Self-pay | Admitting: Oncology

## 2021-04-06 VITALS — BP 131/85 | HR 84 | Temp 98.0°F | Resp 18 | Ht 68.0 in | Wt 141.5 lb

## 2021-04-06 DIAGNOSIS — C329 Malignant neoplasm of larynx, unspecified: Secondary | ICD-10-CM

## 2021-04-06 DIAGNOSIS — Z5112 Encounter for antineoplastic immunotherapy: Secondary | ICD-10-CM | POA: Diagnosis not present

## 2021-04-06 DIAGNOSIS — C328 Malignant neoplasm of overlapping sites of larynx: Secondary | ICD-10-CM | POA: Diagnosis not present

## 2021-04-06 MED ORDER — METHYLPREDNISOLONE SODIUM SUCC 125 MG IJ SOLR
62.5000 mg | Freq: Once | INTRAMUSCULAR | Status: AC
  Administered 2021-04-06: 62.5 mg via INTRAVENOUS
  Filled 2021-04-06: qty 2

## 2021-04-06 MED ORDER — FAMOTIDINE 20 MG IN NS 100 ML IVPB
20.0000 mg | Freq: Once | INTRAVENOUS | Status: AC
  Administered 2021-04-06: 20 mg via INTRAVENOUS
  Filled 2021-04-06: qty 20

## 2021-04-06 MED ORDER — DIPHENHYDRAMINE HCL 50 MG/ML IJ SOLN
25.0000 mg | Freq: Once | INTRAMUSCULAR | Status: AC
  Administered 2021-04-06: 25 mg via INTRAVENOUS
  Filled 2021-04-06: qty 1

## 2021-04-06 MED ORDER — SODIUM CHLORIDE 0.9 % IV SOLN: Freq: Once | INTRAVENOUS | Status: AC

## 2021-04-06 MED ORDER — HEPARIN SOD (PORK) LOCK FLUSH 100 UNIT/ML IV SOLN
500.0000 [IU] | Freq: Once | INTRAVENOUS | Status: AC | PRN
  Administered 2021-04-06: 500 [IU]

## 2021-04-06 MED ORDER — SODIUM CHLORIDE 0.9% FLUSH
10.0000 mL | INTRAVENOUS | Status: DC | PRN
  Administered 2021-04-06: 10 mL

## 2021-04-06 MED ORDER — EMPTY CONTAINERS FLEXIBLE MISC
250.0000 mg/m2 | Freq: Once | Status: AC
  Administered 2021-04-06: 400 mg via INTRAVENOUS
  Filled 2021-04-06: qty 200

## 2021-04-06 NOTE — Patient Instructions (Signed)
[facility]  Discharge Instructions: Thank you for choosing [facility] to provide your oncology and hematology care.  If you have a lab appointment with the [facility], please go directly to the [facility] and check in at the registration area.   Wear comfortable clothing and clothing appropriate for easy access to any Portacath or PICC line.   We strive to give you quality time with your provider. You may need to reschedule your appointment if you arrive late (15 or more minutes).  Arriving late affects you and other patients whose appointments are after yours.  Also, if you miss three or more appointments without notifying the office, you may be dismissed from the clinic at the providers discretion.      For prescription refill requests, have your pharmacy contact our office and allow 72 hours for refills to be completed.    Today you received the following chemotherapy and/or immunotherapy agents Cetuximab      To help prevent nausea and vomiting after your treatment, we encourage you to take your nausea medication as directed.  BELOW ARE SYMPTOMS THAT SHOULD BE REPORTED IMMEDIATELY: *FEVER GREATER THAN 100.4 F (38 C) OR HIGHER *CHILLS OR SWEATING *NAUSEA AND VOMITING THAT IS NOT CONTROLLED WITH YOUR NAUSEA MEDICATION *UNUSUAL SHORTNESS OF BREATH *UNUSUAL BRUISING OR BLEEDING *URINARY PROBLEMS (pain or burning when urinating, or frequent urination) *BOWEL PROBLEMS (unusual diarrhea, constipation, pain near the anus) TENDERNESS IN MOUTH AND THROAT WITH OR WITHOUT PRESENCE OF ULCERS (sore throat, sores in mouth, or a toothache) UNUSUAL RASH, SWELLING OR PAIN  UNUSUAL VAGINAL DISCHARGE OR ITCHING   Items with * indicate a potential emergency and should be followed up as soon as possible or go to the Emergency Department if any problems should occur.  Please show the CHEMOTHERAPY ALERT CARD or IMMUNOTHERAPY ALERT CARD at check-in to the  Emergency Department and triage nurse.  Should you have questions after your visit or need to cancel or reschedule your appointment, please contact [facility]  Dept: [phone]  and follow the prompts.  Office hours are 8:00 a.m. to 4:30 p.m. Monday - Friday. Please note that voicemails left after 4:00 p.m. may not be returned until the following business day.  We are closed weekends and major holidays. You have access to a nurse at all times for urgent questions. Please call the main number to the clinic Dept: [phone] and follow the prompts.  For any non-urgent questions, you may also contact your provider using MyChart. We now offer e-Visits for anyone 62 and older to request care online for non-urgent symptoms. For details visit [email or web address].   Also download the MyChart app! Go to the app store, search "MyChart", open the app, select [facility], and log in with your MyChart username and password.  Due to Covid, a mask is required upon entering the hospital/clinic. If you do not have a mask, one will be given to you upon arrival. For doctor visits, patients may have 1 support person aged 78 or older with them. For treatment visits, patients cannot have anyone with them due to current Covid guidelines and our immunocompromised population.

## 2021-04-07 ENCOUNTER — Encounter: Payer: Self-pay | Admitting: Dietician

## 2021-04-07 ENCOUNTER — Telehealth: Payer: Self-pay | Admitting: Dietician

## 2021-04-07 MED ORDER — JEVITY 1.5 CAL/FIBER PO LIQD: ORAL | 6 refills | Status: AC

## 2021-04-07 NOTE — Addendum Note (Signed)
Addended by: Brunilda Payor on: 04/07/2021 01:43 PM   Modules accepted: Orders

## 2021-04-07 NOTE — Progress Notes (Addendum)
Wrote tube feeding orders.

## 2021-04-07 NOTE — Telephone Encounter (Signed)
Nutrition Follow-up:  Called patient at home telephone number to assess tolerance of Jevity 1.5 TF. He reports he has been taking 4 cartons daily (providing 1420 kcal, 60 g protein,  20 g. Fiber,  100% DRI vitamins and minerals, 1440 ml H2O (720 TF+720 FWF). He has been eating soft moist foods as well. This morning he had a gravy biscuit, and he really enjoyed the strawberry waffles on Tuesday.  He continues to use MMW and it is helping him with his pain.  He reports tolerating TF denies, nausea, vomiting, distention, or bowel concerns.  He did report that he had some hiccups that didn't last long.   Medications: may be able to d/c use of metamucil   Labs: no new labs  Estimated Energy Needs  Kcals: 1890-2200 Protein: 76-95 g Fluid: >/= 1900 ml  Goal tube feed: Bolus feeds Jevity 1.5 237 mL six times a day with 180 ml FWF (60 mL before and 120 mL after) with each feed to provide 100% of Estimate energy needs  Goal TF provides 2100 Kcals , 91 g. Protein, 100% DRI vitamins and minerals, 2160 ml H2O (1080 from TF + 1080 ml FWF)   NUTRITION DIAGNOSIS: Inadequate nutrient and fluid intake related to mucositis and esophagitis from his cancer treatments AEB he usual recall and weight loss. Some improvement, suspect still inadequate as he is not yet at goal TF rate and still attempting PO feeds.      MONITORING, EVALUATION, GOAL: PO intake continues with some soft meals, enteral intake 75% of ENN at this time.  Intervention:  I explained that I would place an order to have a DME provide supplies. Encouraged soft moist meals and fluids and encouraged increasing to goal rate if unable to take in 3 soft meals.  NEXT VISIT: Telephone consult during infusion 04/13/21 He provided his cell [phone] Wife's cell, [phone]  April Manson, RDN, LDN Registered Dietitian, [place] [facility] Part Time Remote (Usual office hours: Tuesday-Thursday) Cell: [phone]

## 2021-04-08 DIAGNOSIS — C328 Malignant neoplasm of overlapping sites of larynx: Secondary | ICD-10-CM | POA: Diagnosis not present

## 2021-04-11 ENCOUNTER — Other Ambulatory Visit: Payer: Self-pay | Admitting: Hematology and Oncology

## 2021-04-11 ENCOUNTER — Other Ambulatory Visit: Payer: Self-pay

## 2021-04-11 ENCOUNTER — Telehealth: Payer: Self-pay

## 2021-04-11 ENCOUNTER — Inpatient Hospital Stay: Payer: PPO

## 2021-04-11 DIAGNOSIS — C329 Malignant neoplasm of larynx, unspecified: Secondary | ICD-10-CM

## 2021-04-11 DIAGNOSIS — D649 Anemia, unspecified: Secondary | ICD-10-CM | POA: Diagnosis not present

## 2021-04-11 DIAGNOSIS — C328 Malignant neoplasm of overlapping sites of larynx: Secondary | ICD-10-CM | POA: Diagnosis not present

## 2021-04-11 LAB — COMPREHENSIVE METABOLIC PANEL
Albumin: 3.9 (ref 3.5–5.0)
Calcium: 8.7 (ref 8.7–10.7)

## 2021-04-11 LAB — CBC AND DIFFERENTIAL
HCT: 46 (ref 41–53)
Hemoglobin: 15.1 (ref 13.5–17.5)
Neutrophils Absolute: 5.02
Platelets: 155 (ref 150–399)
WBC: 6.2

## 2021-04-11 LAB — HEPATIC FUNCTION PANEL
ALT: 23 (ref 10–40)
AST: 25 (ref 14–40)
Alkaline Phosphatase: 88 (ref 25–125)
Bilirubin, Total: 0.7

## 2021-04-11 LAB — BASIC METABOLIC PANEL
BUN: 20 (ref 4–21)
CO2: 29 — AB (ref 13–22)
Chloride: 103 (ref 99–108)
Creatinine: 0.6 (ref 0.6–1.3)
Glucose: 108
Potassium: 4.1 (ref 3.4–5.3)
Sodium: 138 (ref 137–147)

## 2021-04-11 LAB — MAGNESIUM: Magnesium: 2.6

## 2021-04-11 LAB — CBC: RBC: 5.1 (ref 3.87–5.11)

## 2021-04-11 MED ORDER — OXYCODONE HCL 5 MG PO TABS: 5.0000 mg | ORAL_TABLET | ORAL | 0 refills | Status: AC | PRN

## 2021-04-11 NOTE — Telephone Encounter (Signed)
Almira Bar, NP sent in prescription for Oxycodone 5mg .  I called Marlowe Kays and gave her this information.

## 2021-04-12 ENCOUNTER — Encounter: Payer: Self-pay | Admitting: Oncology

## 2021-04-12 ENCOUNTER — Ambulatory Visit: Payer: PPO | Admitting: Dietician

## 2021-04-12 DIAGNOSIS — C328 Malignant neoplasm of overlapping sites of larynx: Secondary | ICD-10-CM | POA: Diagnosis not present

## 2021-04-12 MED FILL — Famotidine in NaCl 0.9% IV Soln 20 MG/50ML: INTRAVENOUS | Qty: 100 | Status: AC

## 2021-04-12 NOTE — Progress Notes (Signed)
Nutrition Follow-up:  Spoke with patient and Rod Holler his wife after his visit with Dr. Orlene Erm.  He reports he has been taking 4 cartons daily (providing 1420 kcal, 60 g protein,  20 g. Fiber,  100% DRI vitamins and minerals, 1440 ml H2O (720 TF+720 FWF). He doesn't have any distention or discomfort with feeds. He continues to try to eating soft moist foods but admits he only gets a few bites down. This morning he tried to eat a gravy biscuit but struggled.  He started using Oxycodone yesterday, he reports getting more pain relief and is still able to swallow meds PO. He continues to use MMW but is not swallowing. He is swishing and attempting to gargle and spits. He reports tolerating TF denies, nausea, vomiting, distention, or bowel concerns.  His formula and supplies arrived on Friday.   Medications: Oxycodone started 04/11/21  Labs: reviewed 04/11/21  Weight: today at radiation 140.3 # 04/06/21  141.5# 04/05/21 139# (radiation) 03/30/21  141 # 03/29/21  132.8 # 03/23/21 142 #  Estimated Energy Needs  Kcals: 1890-2200 Protein: 76-95 g Fluid: >/= 1900 ml  Goal tube feed: Bolus feeds Jevity 1.5 237 mL six times a day with 180 ml FWF (60 mL before and 120 mL after) with each feed to provide 100% of Estimate energy needs  Goal TF provides 2100 Kcals , 91 g. Protein, 100% DRI vitamins and minerals, 2160 ml H2O (1080 from TF + 1080 ml FWF)   NUTRITION DIAGNOSIS: Inadequate nutrient and fluid intake related to mucositis and esophagitis from his cancer treatments AEB he usual recall and weight loss. Some improvement, suspect still inadequate as he is not yet at goal TF rate and still attempting PO feeds.      MONITORING, EVALUATION, GOAL: PO intake continues with some soft meals, enteral intake 75% of ENN at this time.  Intervention:  Encouraged soft moist meals and fluids. Reviewed some easier options to attempt that he enjoys (yogurt, puddings, jello) and encouraged increasing to goal rate  suggested trial of 4 feeds of 1.5 cartons per feed.  Provided written tips for sore mouth and throat, and soft moist high protein choices. Gave business card with contact information.   NEXT VISIT: Telephone consult after radiation treatment.  April Manson, RDN, LDN Registered Dietitian, [place] Part Time Remote (Usual office hours: Tuesday-Thursday) Cell: [phone]

## 2021-04-13 ENCOUNTER — Inpatient Hospital Stay: Payer: PPO

## 2021-04-13 ENCOUNTER — Telehealth: Payer: PPO | Admitting: Dietician

## 2021-04-13 ENCOUNTER — Other Ambulatory Visit: Payer: Self-pay | Admitting: Hematology and Oncology

## 2021-04-13 ENCOUNTER — Other Ambulatory Visit: Payer: Self-pay

## 2021-04-13 DIAGNOSIS — C328 Malignant neoplasm of overlapping sites of larynx: Secondary | ICD-10-CM | POA: Diagnosis not present

## 2021-04-13 DIAGNOSIS — Z5112 Encounter for antineoplastic immunotherapy: Secondary | ICD-10-CM | POA: Diagnosis not present

## 2021-04-13 DIAGNOSIS — C329 Malignant neoplasm of larynx, unspecified: Secondary | ICD-10-CM

## 2021-04-13 MED ORDER — HEPARIN SOD (PORK) LOCK FLUSH 100 UNIT/ML IV SOLN
500.0000 [IU] | Freq: Once | INTRAVENOUS | Status: AC | PRN
  Administered 2021-04-13: 500 [IU]

## 2021-04-13 MED ORDER — OXYCODONE HCL 5 MG PO TABS
10.0000 mg | ORAL_TABLET | ORAL | Status: DC | PRN
  Administered 2021-04-13: 10 mg via ORAL
  Filled 2021-04-13: qty 2

## 2021-04-13 MED ORDER — EMPTY CONTAINERS FLEXIBLE MISC
250.0000 mg/m2 | Freq: Once | Status: AC
  Administered 2021-04-13: 400 mg via INTRAVENOUS
  Filled 2021-04-13: qty 200

## 2021-04-13 MED ORDER — METHYLPREDNISOLONE SODIUM SUCC 125 MG IJ SOLR
62.5000 mg | Freq: Once | INTRAMUSCULAR | Status: AC
  Administered 2021-04-13: 62.5 mg via INTRAVENOUS
  Filled 2021-04-13: qty 2

## 2021-04-13 MED ORDER — SODIUM CHLORIDE 0.9 % IV SOLN: Freq: Once | INTRAVENOUS | Status: AC

## 2021-04-13 MED ORDER — SODIUM CHLORIDE 0.9% FLUSH
10.0000 mL | INTRAVENOUS | Status: DC | PRN
  Administered 2021-04-13: 10 mL

## 2021-04-13 MED ORDER — FAMOTIDINE 20 MG IN NS 100 ML IVPB
20.0000 mg | Freq: Once | INTRAVENOUS | Status: AC
  Administered 2021-04-13: 20 mg via INTRAVENOUS
  Filled 2021-04-13: qty 20

## 2021-04-13 MED ORDER — DIPHENHYDRAMINE HCL 50 MG/ML IJ SOLN
25.0000 mg | Freq: Once | INTRAMUSCULAR | Status: AC
  Administered 2021-04-13: 25 mg via INTRAVENOUS
  Filled 2021-04-13: qty 1

## 2021-04-13 NOTE — Patient Instructions (Signed)
Cetuximab injection What is this medication? CETUXIMAB (se TUX i mab) is a monoclonal antibody. It is used to treat colorectal cancer and head and neck cancer. This medicine may be used for other purposes; ask your health care provider or pharmacist if you have questions. COMMON BRAND NAME(S): Erbitux What should I tell my care team before I take this medication? They need to know if you have any of these conditions: heart disease history of irregular heartbeat history of low levels of calcium, magnesium, or potassium in the blood history of tick bites lung or breathing disease, like asthma red meat allergy an unusual or allergic reaction to cetuximab, other medicines, foods, dyes, or preservatives pregnant or trying to get pregnant breast-feeding How should I use this medication? This drug is given as an infusion into a vein. It is administered in a hospital or clinic by a specially trained health care professional. Talk to your pediatrician regarding the use of this medicine in children. Special care may be needed. Overdosage: If you think you have taken too much of this medicine contact a poison control center or emergency room at once. NOTE: This medicine is only for you. Do not share this medicine with others. What if I miss a dose? It is important not to miss your dose. Call your doctor or health care professional if you are unable to keep an appointment. What may interact with this medication? Interactions are not expected. This list may not describe all possible interactions. Give your health care provider a list of all the medicines, herbs, non-prescription drugs, or dietary supplements you use. Also tell them if you smoke, drink alcohol, or use illegal drugs. Some items may interact with your medicine. What should I watch for while using this medication? Visit your doctor or health care professional for regular checks on your progress. This drug may make you feel generally  unwell. This is not uncommon, as chemotherapy can affect healthy cells as well as cancer cells. Report any side effects. Continue your course of treatment even though you feel ill unless your doctor tells you to stop. This medicine can make you more sensitive to the sun. Keep out of the sun while taking this medicine and for 2 months after the last dose. If you cannot avoid being in the sun, wear protective clothing and use sunscreen. Do not use sun lamps or tanning beds/booths. You may need blood work done while you are taking this medicine. In some cases, you may be given additional medicines to help with side effects. Follow all directions for their use. Call your doctor or health care professional for advice if you get a fever, chills or sore throat, or other symptoms of a cold or flu. Do not treat yourself. This drug decreases your body's ability to fight infections. Try to avoid being around people who are sick. Avoid taking products that contain aspirin, acetaminophen, ibuprofen, naproxen, or ketoprofen unless instructed by your doctor. These medicines may hide a fever. Do not become pregnant while taking this medicine. Women should inform their doctor if they wish to become pregnant or think they might be pregnant. There is a potential for serious side effects to an unborn child. Use adequate birth control methods. Avoid pregnancy for at least 2 months after your last dose. Talk to your health care professional or pharmacist for more information. Do not breast-feed an infant while taking this medicine or during the 2 months after your last dose. What side effects may I notice from  receiving this medication? Side effects that you should report to your doctor or health care professional as soon as possible: allergic reactions like skin rash, itching or hives, swelling of the face, lips, or tongue breathing problems changes in vision fast, irregular heartbeat feeling faint or lightheaded,  falls fever, chills mouth sores redness, blistering, peeling or loosening of the skin, including inside the mouth trouble passing urine or change in the amount of urine unusually weak or tired Side effects that usually do not require medical attention (report to your doctor or health care professional if they continue or are bothersome): changes in skin like acne, cracks, skin dryness constipation diarrhea headache nail changes nausea, vomiting stomach upset weight loss This list may not describe all possible side effects. Call your doctor for medical advice about side effects. You may report side effects to FDA at 1-800-FDA-1088. Where should I keep my medication? This drug is given in a hospital or clinic and will not be stored at home. NOTE: This sheet is a summary. It may not cover all possible information. If you have questions about this medicine, talk to your doctor, pharmacist, or health care provider.  2022 Elsevier/Gold Standard (2020-11-30 00:00:00)

## 2021-04-13 NOTE — Progress Notes (Signed)
[facility]  [address] [place],  [place]  [address] [phone]  Clinic Day:  04/19/2021  Referring physician: Janith Lima, MD  This document serves as a record of services personally performed by Nelta Caudill Macarthur Critchley, MD. It was created on their behalf by [facility] E, a trained medical scribe. The creation of this record is based on the scribe's personal observations and the provider's statements to them.  HISTORY OF PRESENT ILLNESS:  The patient is a 76 y.o. male with stage III (T1 N1 M0) laryngeal cancer.  He he is undergoing definitive therapy, which consists of weekly cetuximab/daily radiation.  Thus far, the patient has tolerated his weekly cetuximab treatments very well.  However, he has begun to have significant skin breakdown over his left neck from his radiation.  In fact, it has gotten so severe to where he has taken the past few days off with the hopes of allowing the skin on his neck to somewhat heal.  Currently, he is contemplating completing 2 additional days of radiation this week.  Beyond that, he is not certain if he will continue to receive further days of radiation afterwards.  As it pertains to his cetuximab therapy, his skin has become more acclimated to this weekly treatment.  He denies having any new symptoms or findings which concern him with disease progression while on his cetuximab/radiation.  PHYSICAL EXAM:  Blood pressure 138/73, pulse 79, temperature 98.2 F (36.8 C), resp. rate 18, height 5\' 8"  (1.727 m), weight 137 lb 1.6 oz (62.2 kg), SpO2 99 %. Wt Readings from Last 3 Encounters:  04/19/21 137 lb 1.6 oz (62.2 kg)  04/06/21 141 lb 8 oz (64.2 kg)  03/30/21 141 lb (64 kg)   Body mass index is 20.85 kg/m. Performance status (ECOG): 1 - Symptomatic but completely ambulatory Physical Exam Constitutional:      Appearance: Normal appearance. He is not ill-appearing.  HENT:     Mouth/Throat:     Mouth: Mucous membranes  are moist.     Pharynx: Oropharynx is clear. No oropharyngeal exudate or posterior oropharyngeal erythema.  Neck:     Trachea: Tracheostomy present.  Cardiovascular:     Rate and Rhythm: Normal rate and regular rhythm.     Heart sounds: No murmur heard.   No friction rub. No gallop.  Pulmonary:     Effort: Pulmonary effort is normal. No respiratory distress.     Breath sounds: Normal breath sounds. No wheezing, rhonchi or rales.  Abdominal:     General: Bowel sounds are normal. There is no distension.     Palpations: Abdomen is soft. There is no mass.     Tenderness: There is no abdominal tenderness.  Musculoskeletal:        General: No swelling.     Right lower leg: No edema.     Left lower leg: No edema.  Lymphadenopathy:     Cervical: No cervical adenopathy.     Upper Body:     Right upper body: No supraclavicular or axillary adenopathy.     Left upper body: No supraclavicular or axillary adenopathy.     Lower Body: No right inguinal adenopathy. No left inguinal adenopathy.  Skin:    General: Skin is warm.     Coloration: Skin is not jaundiced.     Findings: Acne (improved) present. No erythema (facial), lesion or rash.     Comments: Skin breakdown seen over left neck  Neurological:  General: No focal deficit present.     Mental Status: He is alert and oriented to person, place, and time. Mental status is at baseline.  Psychiatric:        Mood and Affect: Mood normal.        Behavior: Behavior normal.        Thought Content: Thought content normal.   LABS:   CBC Latest Ref Rng & Units 04/19/2021 04/11/2021 04/04/2021  WBC - 7.4 6.2 6.8  Hemoglobin 13.5 - 17.5 15.6 15.1 15.3  Hematocrit 41 - 53 49 46 46  Platelets 150 - 399 180 155 154   CMP Latest Ref Rng & Units 04/19/2021 04/11/2021 04/04/2021  Glucose 70 - 99 mg/dL - - -  BUN 4 - 21 17 20 16   Creatinine 0.6 - 1.3 0.6 0.6 0.6  Sodium 137 - 147 138 138 138  Potassium 3.4 - 5.3 4.0 4.1 3.8  Chloride 99 - 108 102 103  106  CO2 13 - 22 30(A) 29(A) 23(A)  Calcium 8.7 - 10.7 9.0 8.7 8.9  Total Protein 6.0 - 8.3 g/dL - - -  Total Bilirubin 0.2 - 1.2 mg/dL - - -  Alkaline Phos 25 - 125 96 88 96  AST 14 - 40 28 25 22   ALT 10 - 40 24 23 20    ASSESSMENT & PLAN:  A 76 y.o. male with stage III (T1 N1 M0) laryngeal cancer.  He will proceed with his next weekly dose of cetuximab this week, which he appears to be tolerating fairly well.  This is being given in conjunction with daily radiation, but his daily radiation doses have been held over the past few days.  As mentioned previously, he is contemplating discontinuing his next few weeks of radiation.  If so, I would also consider discontinuing his final few weeks of cetuximab.  He knows to let us know what his intentions are.  Otherwise, I will see him back in 6 weeks for repeat clinical assessment.  The patient and his wife understand all the plans discussed today and are in agreement with them.  I, Rita Ohara, am acting as scribe for Marice Potter, MD    I have reviewed this report as typed by the medical scribe, and it is complete and accurate.  Mariadejesus Cade Macarthur Critchley, MD

## 2021-04-15 DIAGNOSIS — C328 Malignant neoplasm of overlapping sites of larynx: Secondary | ICD-10-CM | POA: Diagnosis not present

## 2021-04-18 ENCOUNTER — Other Ambulatory Visit: Payer: PPO

## 2021-04-18 ENCOUNTER — Telehealth: Payer: Self-pay

## 2021-04-18 DIAGNOSIS — C323 Malignant neoplasm of laryngeal cartilage: Secondary | ICD-10-CM | POA: Diagnosis not present

## 2021-04-18 NOTE — Telephone Encounter (Signed)
Pt in to see Dr Bobby Rumpf and discussed below with him. Anette Guarneri, reports pt is taking a couple days break from radiation.   Pt doesn't want to continue radiation. He states he is burnt up enough. He wants trach removed, because he is breathing fine. Req scan to see if he even needs to complete treatment anyway. He is using oxycodone 5mg  2 tabs po q 4hr PRN.  He isn't swallowing well. He is using gtube only for feedings. He is using Aloe on skin, as Eucering wasn't helping. I sent the above message to Dr Bobby Rumpf, and Lenna Sciara P,NP, and Dr Orlene Erm (not here today) and Anette Guarneri (not here today) for advice.

## 2021-04-19 ENCOUNTER — Other Ambulatory Visit: Payer: Self-pay | Admitting: Hematology and Oncology

## 2021-04-19 ENCOUNTER — Inpatient Hospital Stay: Payer: PPO | Admitting: Dietician

## 2021-04-19 ENCOUNTER — Encounter: Payer: Self-pay | Admitting: Oncology

## 2021-04-19 ENCOUNTER — Other Ambulatory Visit: Payer: Self-pay

## 2021-04-19 ENCOUNTER — Other Ambulatory Visit: Payer: Self-pay | Admitting: Oncology

## 2021-04-19 ENCOUNTER — Inpatient Hospital Stay: Payer: PPO

## 2021-04-19 ENCOUNTER — Inpatient Hospital Stay (INDEPENDENT_AMBULATORY_CARE_PROVIDER_SITE_OTHER): Payer: PPO | Admitting: Oncology

## 2021-04-19 VITALS — BP 138/73 | HR 79 | Temp 98.2°F | Resp 18 | Ht 68.0 in | Wt 137.1 lb

## 2021-04-19 DIAGNOSIS — C329 Malignant neoplasm of larynx, unspecified: Secondary | ICD-10-CM

## 2021-04-19 DIAGNOSIS — J387 Other diseases of larynx: Secondary | ICD-10-CM | POA: Diagnosis not present

## 2021-04-19 LAB — CBC AND DIFFERENTIAL
HCT: 49 (ref 41–53)
Hemoglobin: 15.6 (ref 13.5–17.5)
Neutrophils Absolute: 6.14
Platelets: 180 (ref 150–399)
WBC: 7.4

## 2021-04-19 LAB — BASIC METABOLIC PANEL
BUN: 17 (ref 4–21)
CO2: 30 — AB (ref 13–22)
Chloride: 102 (ref 99–108)
Creatinine: 0.6 (ref 0.6–1.3)
Glucose: 132
Potassium: 4 (ref 3.4–5.3)
Sodium: 138 (ref 137–147)

## 2021-04-19 LAB — CBC
Absolute Lymphocytes: 0.22 — AB (ref 0.65–4.75)
MCV: 91 (ref 80–94)
RBC: 5.34 — AB (ref 3.87–5.11)

## 2021-04-19 LAB — HEPATIC FUNCTION PANEL
ALT: 24 (ref 10–40)
AST: 28 (ref 14–40)
Alkaline Phosphatase: 96 (ref 25–125)
Bilirubin, Total: 0.8

## 2021-04-19 LAB — MAGNESIUM: Magnesium: 2.5 — AB (ref 1.6–2.3)

## 2021-04-19 LAB — COMPREHENSIVE METABOLIC PANEL
Albumin: 3.9 (ref 3.5–5.0)
Calcium: 9 (ref 8.7–10.7)

## 2021-04-19 NOTE — Progress Notes (Signed)
Nutrition Follow-up:  Spoke with patient on his cell phone [phone]. He was with Rod Holler who answered phone.  He reports he is on break from radiation until Thursday when he comes back for more treatments. His throat is all burnt up.  He reports he has been taking 4-5 cartons daily (providing 1420-1775 kcal, 60-75 g protein,  20-25 g. Fiber,  100% DRI vitamins and minerals, 1440-1800 ml H2O (720-900 TF+720-900 FWF). He  is taking all nourishment through PEG but is still using MMW and swallowing medications. He reports tolerating TF denies, nausea, vomiting, distention, or bowel concerns.     Medications: Reviewed  Labs: reviewed 04/19/21  Weight: today 137#   weight loss 3# since last week 04/12/21 140#  04/06/21  141.5# 04/05/21 139# (radiation) 03/30/21  141 # 03/29/21  132.8 # 03/23/21 142 #  Estimated Energy Needs  Kcals: 1890-2200 Protein: 76-95 g Fluid: >/= 1900 ml  Goal tube feed: Bolus feeds Jevity 1.5 237 mL six times a day with 180 ml FWF (60 mL before and 120 mL after) with each feed to provide 100% of Estimate energy needs  Goal TF provides 2100 Kcals , 91 g. Protein, 100% DRI vitamins and minerals, 2160 ml H2O (1080 from TF + 1080 ml FWF)   NUTRITION DIAGNOSIS: Inadequate nutrient and fluid intake related to mucositis and esophagitis from his cancer treatments AEB he usual recall and weight loss. Worsening pain with swallowing still inadequate as he is not yet at goal TF rate and no longer attempting PO feeds.      MONITORING, EVALUATION, GOAL: TF intake, weight trends.  Intervention:  Encouraged increasing to goal rate 6 cartons, suggested trial of 4 feeds of 1.5 cartons per feed with BID flush 180 mL   NEXT VISIT: Telephone consult after radiation treatment 04/26/21  April Manson, RDN, LDN Registered Dietitian, [place] Part Time Remote (Usual office hours: Tuesday-Thursday) Cell: [phone]

## 2021-04-20 ENCOUNTER — Other Ambulatory Visit: Payer: Self-pay

## 2021-04-20 ENCOUNTER — Inpatient Hospital Stay: Payer: PPO

## 2021-04-20 ENCOUNTER — Encounter: Payer: Self-pay | Admitting: Oncology

## 2021-04-20 VITALS — BP 123/63 | HR 89 | Temp 98.1°F | Resp 18 | Ht 68.0 in | Wt 136.2 lb

## 2021-04-20 DIAGNOSIS — Z5112 Encounter for antineoplastic immunotherapy: Secondary | ICD-10-CM | POA: Diagnosis not present

## 2021-04-20 DIAGNOSIS — C329 Malignant neoplasm of larynx, unspecified: Secondary | ICD-10-CM

## 2021-04-20 MED ORDER — SODIUM CHLORIDE 0.9 % IV SOLN: Freq: Once | INTRAVENOUS | Status: AC

## 2021-04-20 MED ORDER — FAMOTIDINE 20 MG IN NS 100 ML IVPB
20.0000 mg | Freq: Once | INTRAVENOUS | Status: AC
  Administered 2021-04-20: 20 mg via INTRAVENOUS
  Filled 2021-04-20: qty 20

## 2021-04-20 MED ORDER — METHYLPREDNISOLONE SODIUM SUCC 125 MG IJ SOLR
62.5000 mg | Freq: Once | INTRAMUSCULAR | Status: AC
  Administered 2021-04-20: 62.5 mg via INTRAVENOUS
  Filled 2021-04-20: qty 2

## 2021-04-20 MED ORDER — HEPARIN SOD (PORK) LOCK FLUSH 100 UNIT/ML IV SOLN
500.0000 [IU] | Freq: Once | INTRAVENOUS | Status: AC | PRN
  Administered 2021-04-20: 500 [IU]

## 2021-04-20 MED ORDER — DIPHENHYDRAMINE HCL 50 MG/ML IJ SOLN
25.0000 mg | Freq: Once | INTRAMUSCULAR | Status: AC
  Administered 2021-04-20: 25 mg via INTRAVENOUS
  Filled 2021-04-20: qty 1

## 2021-04-20 MED ORDER — EMPTY CONTAINERS FLEXIBLE MISC
250.0000 mg/m2 | Freq: Once | Status: AC
  Administered 2021-04-20: 400 mg via INTRAVENOUS
  Filled 2021-04-20: qty 200

## 2021-04-20 MED ORDER — SODIUM CHLORIDE 0.9% FLUSH
10.0000 mL | INTRAVENOUS | Status: DC | PRN
  Administered 2021-04-20: 10 mL

## 2021-04-20 NOTE — Patient Instructions (Signed)
[facility]  Discharge Instructions: Thank you for choosing [facility] to provide your oncology and hematology care.  If you have a lab appointment with the [facility], please go directly to the [facility] and check in at the registration area.   Wear comfortable clothing and clothing appropriate for easy access to any Portacath or PICC line.   We strive to give you quality time with your provider. You may need to reschedule your appointment if you arrive late (15 or more minutes).  Arriving late affects you and other patients whose appointments are after yours.  Also, if you miss three or more appointments without notifying the office, you may be dismissed from the clinic at the providers discretion.      For prescription refill requests, have your pharmacy contact our office and allow 72 hours for refills to be completed.    Today you received the following chemotherapy and/or immunotherapy agents cetuximab      To help prevent nausea and vomiting after your treatment, we encourage you to take your nausea medication as directed.  BELOW ARE SYMPTOMS THAT SHOULD BE REPORTED IMMEDIATELY: *FEVER GREATER THAN 100.4 F (38 C) OR HIGHER *CHILLS OR SWEATING *NAUSEA AND VOMITING THAT IS NOT CONTROLLED WITH YOUR NAUSEA MEDICATION *UNUSUAL SHORTNESS OF BREATH *UNUSUAL BRUISING OR BLEEDING *URINARY PROBLEMS (pain or burning when urinating, or frequent urination) *BOWEL PROBLEMS (unusual diarrhea, constipation, pain near the anus) TENDERNESS IN MOUTH AND THROAT WITH OR WITHOUT PRESENCE OF ULCERS (sore throat, sores in mouth, or a toothache) UNUSUAL RASH, SWELLING OR PAIN  UNUSUAL VAGINAL DISCHARGE OR ITCHING   Items with * indicate a potential emergency and should be followed up as soon as possible or go to the Emergency Department if any problems should occur.  Please show the CHEMOTHERAPY ALERT CARD or IMMUNOTHERAPY ALERT CARD at check-in to the  Emergency Department and triage nurse.  Should you have questions after your visit or need to cancel or reschedule your appointment, please contact [facility]  Dept: [phone]  and follow the prompts.  Office hours are 8:00 a.m. to 4:30 p.m. Monday - Friday. Please note that voicemails left after 4:00 p.m. may not be returned until the following business day.  We are closed weekends and major holidays. You have access to a nurse at all times for urgent questions. Please call the main number to the clinic Dept: [phone] and follow the prompts.  For any non-urgent questions, you may also contact your provider using MyChart. We now offer e-Visits for anyone 38 and older to request care online for non-urgent symptoms. For details visit [email or web address].   Also download the MyChart app! Go to the app store, search "MyChart", open the app, select [facility], and log in with your MyChart username and password.  Due to Covid, a mask is required upon entering the hospital/clinic. If you do not have a mask, one will be given to you upon arrival. For doctor visits, patients may have 1 support person aged 6 or older with them. For treatment visits, patients cannot have anyone with them due to current Covid guidelines and our immunocompromised population.

## 2021-04-21 ENCOUNTER — Telehealth: Payer: Self-pay

## 2021-04-21 ENCOUNTER — Encounter: Payer: Self-pay | Admitting: Oncology

## 2021-04-21 DIAGNOSIS — C328 Malignant neoplasm of overlapping sites of larynx: Secondary | ICD-10-CM | POA: Diagnosis not present

## 2021-04-21 NOTE — Telephone Encounter (Addendum)
Murrel Bertram,RN: I notified pt's wife of below. She verbalized understanding.  Melissa,NP: No. He has stayed between 27 and 30 all along.   Pt's wife called and is concerned about the CO2 result she saw on his labs from the 24th (in [place]). Is this anything to worry about?    I forwarded the above message to [facility] @ 1411.

## 2021-04-22 ENCOUNTER — Encounter: Payer: Self-pay | Admitting: Oncology

## 2021-04-22 DIAGNOSIS — C328 Malignant neoplasm of overlapping sites of larynx: Secondary | ICD-10-CM | POA: Diagnosis not present

## 2021-04-25 ENCOUNTER — Encounter: Payer: Self-pay | Admitting: Oncology

## 2021-04-25 ENCOUNTER — Other Ambulatory Visit: Payer: PPO

## 2021-04-26 ENCOUNTER — Inpatient Hospital Stay: Payer: PPO | Admitting: Dietician

## 2021-04-26 ENCOUNTER — Encounter: Payer: Self-pay | Admitting: Oncology

## 2021-04-26 ENCOUNTER — Inpatient Hospital Stay: Payer: PPO

## 2021-04-26 ENCOUNTER — Other Ambulatory Visit: Payer: Self-pay

## 2021-04-26 ENCOUNTER — Other Ambulatory Visit: Payer: Self-pay | Admitting: Hematology and Oncology

## 2021-04-26 DIAGNOSIS — C328 Malignant neoplasm of overlapping sites of larynx: Secondary | ICD-10-CM | POA: Diagnosis not present

## 2021-04-26 DIAGNOSIS — D649 Anemia, unspecified: Secondary | ICD-10-CM | POA: Diagnosis not present

## 2021-04-26 DIAGNOSIS — C329 Malignant neoplasm of larynx, unspecified: Secondary | ICD-10-CM | POA: Diagnosis not present

## 2021-04-26 DIAGNOSIS — Z5112 Encounter for antineoplastic immunotherapy: Secondary | ICD-10-CM | POA: Diagnosis not present

## 2021-04-26 LAB — HEPATIC FUNCTION PANEL
ALT: 33 (ref 10–40)
AST: 34 (ref 14–40)
Alkaline Phosphatase: 87 (ref 25–125)
Bilirubin, Total: 0.4

## 2021-04-26 LAB — BASIC METABOLIC PANEL
BUN: 17 (ref 4–21)
CO2: 32 — AB (ref 13–22)
Chloride: 100 (ref 99–108)
Creatinine: 0.6 (ref 0.6–1.3)
Glucose: 76
Potassium: 4 (ref 3.4–5.3)
Sodium: 138 (ref 137–147)

## 2021-04-26 LAB — COMPREHENSIVE METABOLIC PANEL
Albumin: 3.9 (ref 3.5–5.0)
Calcium: 8.7 (ref 8.7–10.7)

## 2021-04-26 LAB — CBC AND DIFFERENTIAL
HCT: 48 (ref 41–53)
Hemoglobin: 15.8 (ref 13.5–17.5)
Neutrophils Absolute: 4.9
Platelets: 182 (ref 150–399)
WBC: 6.2

## 2021-04-26 LAB — MAGNESIUM: Magnesium: 2.6

## 2021-04-26 LAB — CBC: RBC: 5.35 — AB (ref 3.87–5.11)

## 2021-04-26 LAB — TSH: TSH: 2.337 u[IU]/mL (ref 0.350–4.500)

## 2021-04-26 NOTE — Progress Notes (Signed)
Nutrition Follow-up:  Met with patient, wife and sister Marlowe Kays in person. He reports he has been taking 6 cartons daily (providing 2100 Kcals , 91 g. Protein, 100% DRI vitamins and minerals, 2160 ml H2O (1080 from TF + 1080 ml FWF). He  is not taking any nourishment by mouth all nourishment through PEG but is still using MMW and swallowing medications. He reports tolerating TF denies, nausea, vomiting, distention. He has had some constipation and hard stools. He does report activity level remains good.     Medications: Reviewed  Labs: reviewed 04/26/21  Weight: today 138#  in [place], weight gain 2.75# since last week 04/20/21 135.25# 04/12/21 140#  04/06/21  141.5# 04/05/21 139# (radiation) 03/30/21  141 # 03/29/21  132.8 # 03/23/21 142 #  Estimated Energy Needs  Kcals: 1890-2200 Protein: 76-95 g Fluid: >/= 1900 ml  Goal tube feed: Bolus feeds Jevity 1.5 237 mL six times a day with 180 ml FWF (60 mL before and 120 mL after) with each feed to provide 100% of Estimate energy needs  Goal TF provides 2100 Kcals , 91 g. Protein, 100% DRI vitamins and minerals, 2160 ml H2O (1080 from TF + 1080 ml FWF)   NUTRITION DIAGNOSIS: Inadequate nutrient and fluid intake related to mucositis and esophagitis from his cancer treatments AEB he usual recall and weight loss. He reports being at goal TF rate and no longer attempting PO feeds but enteral intake has improved and weight increasing.      MONITORING, EVALUATION, GOAL: TF intake, weight trends.  Intervention:  Encouraged increasing continuing goal rate 6 cartons,  adding extra BID flush 180 mL to help with bowels.  NEXT VISIT: In-person consult after radiation treatment  and PUT visit 05/03/21  April Manson, RDN, LDN Registered Dietitian, [place] Part Time Remote (Usual office hours: Tuesday-Thursday) Cell: [phone]

## 2021-04-27 ENCOUNTER — Inpatient Hospital Stay: Payer: PPO | Attending: Oncology

## 2021-04-27 ENCOUNTER — Ambulatory Visit: Payer: PPO

## 2021-04-27 VITALS — BP 131/72 | HR 85 | Temp 98.0°F | Resp 18 | Ht 68.0 in | Wt 138.2 lb

## 2021-04-27 DIAGNOSIS — C329 Malignant neoplasm of larynx, unspecified: Secondary | ICD-10-CM | POA: Insufficient documentation

## 2021-04-27 DIAGNOSIS — Z5112 Encounter for antineoplastic immunotherapy: Secondary | ICD-10-CM | POA: Insufficient documentation

## 2021-04-27 DIAGNOSIS — C328 Malignant neoplasm of overlapping sites of larynx: Secondary | ICD-10-CM | POA: Diagnosis not present

## 2021-04-27 DIAGNOSIS — Z510 Encounter for antineoplastic radiation therapy: Secondary | ICD-10-CM | POA: Diagnosis not present

## 2021-04-27 MED ORDER — METHYLPREDNISOLONE SODIUM SUCC 125 MG IJ SOLR
62.5000 mg | Freq: Once | INTRAMUSCULAR | Status: AC
  Administered 2021-04-27: 62.5 mg via INTRAVENOUS
  Filled 2021-04-27: qty 2

## 2021-04-27 MED ORDER — DIPHENHYDRAMINE HCL 50 MG/ML IJ SOLN
25.0000 mg | Freq: Once | INTRAMUSCULAR | Status: AC
  Administered 2021-04-27: 25 mg via INTRAVENOUS
  Filled 2021-04-27: qty 1

## 2021-04-27 MED ORDER — EMPTY CONTAINERS FLEXIBLE MISC
250.0000 mg/m2 | Freq: Once | Status: AC
  Administered 2021-04-27: 400 mg via INTRAVENOUS
  Filled 2021-04-27: qty 200

## 2021-04-27 MED ORDER — SODIUM CHLORIDE 0.9% FLUSH
10.0000 mL | INTRAVENOUS | Status: DC | PRN
  Administered 2021-04-27: 10 mL

## 2021-04-27 MED ORDER — HEPARIN SOD (PORK) LOCK FLUSH 100 UNIT/ML IV SOLN
500.0000 [IU] | Freq: Once | INTRAVENOUS | Status: AC | PRN
  Administered 2021-04-27: 500 [IU]

## 2021-04-27 MED ORDER — FAMOTIDINE IN NACL 20-0.9 MG/50ML-% IV SOLN
20.0000 mg | Freq: Once | INTRAVENOUS | Status: AC
  Administered 2021-04-27: 20 mg via INTRAVENOUS
  Filled 2021-04-27: qty 50

## 2021-04-27 MED ORDER — SODIUM CHLORIDE 0.9 % IV SOLN: Freq: Once | INTRAVENOUS | Status: AC

## 2021-04-27 NOTE — Patient Instructions (Signed)
[facility]  Discharge Instructions: Thank you for choosing [facility] to provide your oncology and hematology care.  If you have a lab appointment with the [facility], please go directly to the [facility] and check in at the registration area.   Wear comfortable clothing and clothing appropriate for easy access to any Portacath or PICC line.   We strive to give you quality time with your provider. You may need to reschedule your appointment if you arrive late (15 or more minutes).  Arriving late affects you and other patients whose appointments are after yours.  Also, if you miss three or more appointments without notifying the office, you may be dismissed from the clinic at the providers discretion.      For prescription refill requests, have your pharmacy contact our office and allow 72 hours for refills to be completed.    Today you received the following chemotherapy and/or immunotherapy agents Cetuximab      To help prevent nausea and vomiting after your treatment, we encourage you to take your nausea medication as directed.  BELOW ARE SYMPTOMS THAT SHOULD BE REPORTED IMMEDIATELY: *FEVER GREATER THAN 100.4 F (38 C) OR HIGHER *CHILLS OR SWEATING *NAUSEA AND VOMITING THAT IS NOT CONTROLLED WITH YOUR NAUSEA MEDICATION *UNUSUAL SHORTNESS OF BREATH *UNUSUAL BRUISING OR BLEEDING *URINARY PROBLEMS (pain or burning when urinating, or frequent urination) *BOWEL PROBLEMS (unusual diarrhea, constipation, pain near the anus) TENDERNESS IN MOUTH AND THROAT WITH OR WITHOUT PRESENCE OF ULCERS (sore throat, sores in mouth, or a toothache) UNUSUAL RASH, SWELLING OR PAIN  UNUSUAL VAGINAL DISCHARGE OR ITCHING   Items with * indicate a potential emergency and should be followed up as soon as possible or go to the Emergency Department if any problems should occur.  Please show the CHEMOTHERAPY ALERT CARD or IMMUNOTHERAPY ALERT CARD at check-in to the  Emergency Department and triage nurse.  Should you have questions after your visit or need to cancel or reschedule your appointment, please contact [facility]  Dept: [phone]  and follow the prompts.  Office hours are 8:00 a.m. to 4:30 p.m. Monday - Friday. Please note that voicemails left after 4:00 p.m. may not be returned until the following business day.  We are closed weekends and major holidays. You have access to a nurse at all times for urgent questions. Please call the main number to the clinic Dept: [phone] and follow the prompts.  For any non-urgent questions, you may also contact your provider using MyChart. We now offer e-Visits for anyone 73 and older to request care online for non-urgent symptoms. For details visit [email or web address].   Also download the MyChart app! Go to the app store, search "MyChart", open the app, select [facility], and log in with your MyChart username and password.  Due to Covid, a mask is required upon entering the hospital/clinic. If you do not have a mask, one will be given to you upon arrival. For doctor visits, patients may have 1 support person aged 26 or older with them. For treatment visits, patients cannot have anyone with them due to current Covid guidelines and our immunocompromised population.

## 2021-04-28 DIAGNOSIS — C328 Malignant neoplasm of overlapping sites of larynx: Secondary | ICD-10-CM | POA: Diagnosis not present

## 2021-04-29 DIAGNOSIS — C328 Malignant neoplasm of overlapping sites of larynx: Secondary | ICD-10-CM | POA: Diagnosis not present

## 2021-05-02 ENCOUNTER — Encounter: Payer: Self-pay | Admitting: Oncology

## 2021-05-03 ENCOUNTER — Ambulatory Visit: Payer: PPO | Admitting: Dietician

## 2021-05-03 DIAGNOSIS — C328 Malignant neoplasm of overlapping sites of larynx: Secondary | ICD-10-CM | POA: Diagnosis not present

## 2021-05-03 NOTE — Progress Notes (Signed)
Nutrition Follow-up:   Met with patient, wife and sister Marlowe Kays in person. He reports he has been taking 6 cartons daily (providing 2100 Kcals , 91 g. Protein, 100% DRI vitamins and minerals, 2160 ml H2O (1080 from TF + 1080 ml FWF). He  is not taking any nourishment or meds by mouth all nourishment through PEG. Continues tolerating TF denies, nausea, vomiting, distention. He continues with constipation and hard stools, last BM on Friday will take one laxative today.He does report activity level remains good. He finished radiation today and will follow up with both doctors on March 7th.        Medications: Reviewed   Labs: no new labs   Weight: today  136# in [place]  04/26/21 138#   04/20/21 135.25# 04/12/21 140#  04/06/21  141.5# 04/05/21 139# (radiation) 03/30/21  141 # 03/29/21  132.8 # 03/23/21 142 #   Estimated Energy Needs   Kcals: 1890-2200 Protein: 76-95 g Fluid: >/= 1900 ml   Goal tube feed: Bolus feeds Jevity 1.5 237 mL six times a day with 180 ml FWF (60 mL before and 120 mL after) with each feed to provide 100% of Estimate energy needs   Goal TF provides 2100 Kcals , 91 g. Protein, 100% DRI vitamins and minerals, 2160 ml H2O (1080 from TF + 1080 ml FWF)     NUTRITION DIAGNOSIS: Inadequate nutrient and fluid intake related to mucositis and esophagitis from his cancer treatments AEB he usual recall and weight loss. He reports being at goal TF rate.       MONITORING, EVALUATION, GOAL: TF intake, weight trends.   Intervention:  Encouraged increasing continuing goal rate 6 cartons,  adding extra BID flush 180 mL to help with bowels.   NEXT VISIT: Telephone f/u  05/11/21 , in person consult 05/31/21   April Manson, RDN, LDN Registered Dietitian, [place] Part Time Remote (Usual office hours: Tuesday-Thursday) Cell: [phone]

## 2021-05-07 ENCOUNTER — Other Ambulatory Visit: Payer: Self-pay | Admitting: Oncology

## 2021-05-18 ENCOUNTER — Inpatient Hospital Stay: Payer: PPO | Admitting: Dietician

## 2021-05-18 ENCOUNTER — Other Ambulatory Visit: Payer: Self-pay

## 2021-05-18 NOTE — Progress Notes (Signed)
Nutrition Follow-up:   Spoke with patient over the telephone. He reports still sore when swallowing. He rates pain as 6 right now. He did take DM pills by mouth, and sips some water. He reports he has been taking 6 cartons daily (providing 2100 Kcals , 91 g. Protein, 100% DRI vitamins and minerals, 2160 ml H2O (1080 from TF + 1080 ml FWF). He is not taking any nourishment or meds by mouth other than water and MMW. Continues tolerating TF denies, nausea, vomiting, distention. His constipation has resolved still ocassional  hard stools.  He does report activity level remains good.      Medications: Still using magic mouthwash   Labs: no new labs   Weight: Patient reports he checked weight at home no clothes on 130#s  05/03/21  136# in [place] 04/26/21 138#   04/20/21 135.25# 04/12/21 140#  04/06/21  141.5# 04/05/21 139# (radiation) 03/30/21  141 # 03/29/21  132.8 # 03/23/21 142 #   Estimated Energy Needs   Kcals: 1890-2200 Protein: 76-95 g Fluid: >/= 1900 ml   Goal tube feed: Bolus feeds Jevity 1.5 237 mL six times a day with 180 ml FWF (60 mL before and 120 mL after) with each feed to provide 100% of estimate energy needs   Goal TF provides 2100 Kcals , 91 g. Protein, 100% DRI vitamins and minerals, 2160 ml H2O (1080 from TF + 1080 ml FWF)     NUTRITION DIAGNOSIS: Inadequate nutrient and fluid intake related to mucositis and esophagitis from his cancer treatments AEB he usual recall and weight loss. He reports being at goal TF rate now meeting 100% ENN.       MONITORING, EVALUATION, GOAL: TF intake, weight trends.   Intervention:  Encouraged continuing goal rate 6 cartons. He has supplies for 10 more days. Encouraged him to have Marlowe Kays attempt to get  month supply.    NEXT VISIT: In person consult 05/31/21   April Manson, RDN, LDN Registered Dietitian, [place] Part Time Remote (Usual office hours: Tuesday-Thursday) Cell: [phone]
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
## 2021-05-31 ENCOUNTER — Encounter: Payer: Self-pay | Admitting: Oncology

## 2021-05-31 ENCOUNTER — Other Ambulatory Visit: Payer: Self-pay

## 2021-05-31 ENCOUNTER — Other Ambulatory Visit: Payer: Self-pay | Admitting: Oncology

## 2021-05-31 ENCOUNTER — Inpatient Hospital Stay: Payer: PPO

## 2021-05-31 ENCOUNTER — Ambulatory Visit: Payer: PPO | Admitting: Dietician

## 2021-05-31 ENCOUNTER — Inpatient Hospital Stay: Payer: PPO | Attending: Oncology | Admitting: Oncology

## 2021-05-31 VITALS — BP 146/84 | HR 89 | Temp 98.6°F | Resp 18 | Ht 68.0 in | Wt 137.1 lb

## 2021-05-31 DIAGNOSIS — C329 Malignant neoplasm of larynx, unspecified: Secondary | ICD-10-CM | POA: Insufficient documentation

## 2021-05-31 DIAGNOSIS — Z79899 Other long term (current) drug therapy: Secondary | ICD-10-CM | POA: Insufficient documentation

## 2021-05-31 DIAGNOSIS — D649 Anemia, unspecified: Secondary | ICD-10-CM | POA: Diagnosis not present

## 2021-05-31 DIAGNOSIS — Z7982 Long term (current) use of aspirin: Secondary | ICD-10-CM | POA: Insufficient documentation

## 2021-05-31 DIAGNOSIS — Z923 Personal history of irradiation: Secondary | ICD-10-CM | POA: Diagnosis not present

## 2021-05-31 DIAGNOSIS — Z9221 Personal history of antineoplastic chemotherapy: Secondary | ICD-10-CM | POA: Diagnosis not present

## 2021-05-31 LAB — BASIC METABOLIC PANEL
BUN: 17 (ref 4–21)
CO2: 28 — AB (ref 13–22)
Chloride: 106 (ref 99–108)
Creatinine: 0.6 (ref 0.6–1.3)
Glucose: 98
Potassium: 3.9 mEq/L (ref 3.5–5.1)
Sodium: 140 (ref 137–147)

## 2021-05-31 LAB — TSH: TSH: 2.939 u[IU]/mL (ref 0.350–4.500)

## 2021-05-31 LAB — CBC AND DIFFERENTIAL
HCT: 46 (ref 41–53)
Hemoglobin: 14.8 (ref 13.5–17.5)
Neutrophils Absolute: 4.42
Platelets: 148 10*3/uL — AB (ref 150–400)
WBC: 5.6

## 2021-05-31 LAB — COMPREHENSIVE METABOLIC PANEL
Albumin: 4.3 (ref 3.5–5.0)
Calcium: 9.5 (ref 8.7–10.7)

## 2021-05-31 LAB — HEPATIC FUNCTION PANEL
ALT: 23 U/L (ref 10–40)
AST: 27 (ref 14–40)
Alkaline Phosphatase: 80 (ref 25–125)
Bilirubin, Total: 0.7

## 2021-05-31 LAB — CBC: RBC: 5.14 — AB (ref 3.87–5.11)

## 2021-05-31 NOTE — Progress Notes (Signed)
Nutrition Follow-up: ? ?Patient is here today for one month follow up visit with radiation and med onc.  Met with patient, wife and sister-in-law today in-person.  He is eating and drinking some by mouth but still using his PEG to supplement because foods are not tasting good and it is inhibiting his PO intake.   ?He has tried Veterinary surgeon and the pickle on it was the one thing that tasted delicious to him. He has tried fries, pizza, grits and grilled  cheese.  ? ?He is still giving himself 3 bottles of Jevity 1.5 daily at 3 separate feeds with 120 mL before and after.  He is timing his feeds around meal times or just before and thus may be inhibiting his PO intake.  ? ?Current PEG feeds providing 50% ENN (1065 calories, 45 g pro, 1263 ml water) ? ?Medications: reviewed ? ?Labs: 05/31/21 reviewed ? ?Weight: Patient reports he continues to check weight at home no clothes on and hasn't noticed any changes ?05/31/21  137.1# ?05/03/21  136# in [place] ?04/26/21 138#   ?04/20/21 135.25# ?04/12/21 140#  ?04/06/21  141.5# ?04/05/21 139# (radiation) ?03/30/21  141 # ?03/29/21  132.8 # ?03/23/21 142 # ?  ?Estimated Energy Needs ?  ?Kcals: 9767-3419 ?Protein: 76-95 g ?Fluid: >/= 1900 ml ? ?NUTRITION DIAGNOSIS: NUTRITION DIAGNOSIS: Inadequate nutrient and fluid intake related to mucositis and esophagitis from his cancer treatments AEB he usual recall and weight loss. Resolving with weaning TF as PO increases. ? ?     ?MONITORING, EVALUATION, GOAL: PO intake, TF intake, weight trends. ? ?Intervention:  Encouraged changing timing of PEG feeds to after lunch and after dinner. Encouraged use of high calorie high protein home made shakes. Advised he can not have tube removed until weight stable with feeds for month. Recommended Tube change. Provided recipes for building healthy high calorie shakes. Encouraged trial or yogurts or buttermilk as base since tangy taste is what he tolerating now. ?  ?NEXT VISIT: telephone consult 06/16/21 ?  ?April Manson, RDN, LDN ?Registered Dietitian, [place] ?Part Time Remote (Usual office hours: Tuesday-Thursday) ?Cell: [phone]  ?

## 2021-06-01 ENCOUNTER — Encounter: Payer: Self-pay | Admitting: Oncology

## 2021-06-08 ENCOUNTER — Other Ambulatory Visit: Payer: Self-pay | Admitting: Internal Medicine

## 2021-06-08 DIAGNOSIS — E785 Hyperlipidemia, unspecified: Secondary | ICD-10-CM

## 2021-06-16 ENCOUNTER — Ambulatory Visit: Payer: PPO | Admitting: Dietician

## 2021-06-16 NOTE — Progress Notes (Signed)
Nutrition Follow-up: ?  ?Called patient at his home phone number.  He didn't get his tube replaced and hasn't been using it for over a week. He reports he's eating much better and tolerating many textures with greater volume of foods.  He was not set up with home health to replace tube.  He would like tube removed as it has gunk in tube and he feels he can maintain health without it's use as the pain with eating has subsided.  He major concern now is that his taste hasn't returned and that limited the items that appeal to him.  ?Yesterday and this morning's intake: ?BLT for breakfast ?Lunch was a sandwich ?Dinner had beef tips, and salad  ?[facility] but didn't taste as good as it should  ?Been drinking water, chocolate milk and CIB ? ?Medications: reviewed ?  ?Labs: no new labs ?  ?Weight: Patient reports he continues to check weight at home and he only has lost one pound ?05/31/21  137.1# ?05/03/21  136# in [place] ?04/06/21  141.5# ?04/05/21 139# (radiation) ?03/30/21  141 # ?03/23/21 142 # ?  ?Estimated Energy Needs ?  ?Kcals: 8889-1694 ?Protein: 76-95 g ?Fluid: >/= 1900 ml ?  ?NUTRITION DIAGNOSIS: NUTRITION DIAGNOSIS: Inadequate nutrient and fluid intake related to mucositis and esophagitis from his cancer treatments AEB he usual recall and weight loss. Resolving with PO increased intake and reduced pain. ?  ?     ?MONITORING, EVALUATION, GOAL: PO intake, TF intake, weight trends. ?  ?Intervention:  Encouraged him to try to flush the tube with water then peroxide to clean it. Emphasized that he can't remove the tube on his own.  Encouraged him to reach out to the doctor who placed it or his PCP to have home health at least change not comfortable removing. ?  ?NEXT VISIT:  telephone follow up 06/30/21 ?  ?April Manson, RDN, LDN ?Registered Dietitian, [place] ?Part Time Remote (Usual office hours: Tuesday-Thursday) ?Cell: [phone]  ?

## 2021-06-27 DIAGNOSIS — C329 Malignant neoplasm of larynx, unspecified: Secondary | ICD-10-CM | POA: Diagnosis not present

## 2021-06-27 DIAGNOSIS — Z431 Encounter for attention to gastrostomy: Secondary | ICD-10-CM | POA: Diagnosis not present

## 2021-06-27 HISTORY — PX: IR GASTROSTOMY TUBE REMOVAL: IMG5492

## 2021-06-28 ENCOUNTER — Encounter: Payer: Self-pay | Admitting: Oncology

## 2021-06-30 ENCOUNTER — Ambulatory Visit: Payer: PPO | Admitting: Dietician

## 2021-06-30 NOTE — Progress Notes (Signed)
Attempted to reach patient to check on status of replacing or removing his PEG, PO intake and weight.  Left message on voice mail to return  call. ? ?April Manson, RDN, LDN ?Registered Dietitian, [place] ?Part Time Remote (Usual office hours: Tuesday-Thursday) ?Cell: [phone]  ?

## 2021-07-20 ENCOUNTER — Ambulatory Visit (INDEPENDENT_AMBULATORY_CARE_PROVIDER_SITE_OTHER): Payer: PPO | Admitting: Internal Medicine

## 2021-07-20 ENCOUNTER — Encounter: Payer: Self-pay | Admitting: Internal Medicine

## 2021-07-20 ENCOUNTER — Ambulatory Visit (INDEPENDENT_AMBULATORY_CARE_PROVIDER_SITE_OTHER): Payer: PPO

## 2021-07-20 VITALS — BP 110/70 | HR 74 | Temp 98.0°F | Ht 68.0 in | Wt 134.0 lb

## 2021-07-20 VITALS — BP 110/70 | HR 74 | Temp 98.0°F | Ht 68.0 in | Wt 134.4 lb

## 2021-07-20 DIAGNOSIS — Z0000 Encounter for general adult medical examination without abnormal findings: Secondary | ICD-10-CM | POA: Diagnosis not present

## 2021-07-20 DIAGNOSIS — K4090 Unilateral inguinal hernia, without obstruction or gangrene, not specified as recurrent: Secondary | ICD-10-CM | POA: Diagnosis not present

## 2021-07-20 NOTE — Patient Instructions (Signed)
[name] , ?Thank you for taking time to come for your Medicare Wellness Visit. I appreciate your ongoing commitment to your health goals. Please review the following plan we discussed and let me know if I can assist you in the future.  ? ?Screening recommendations/referrals: ?Colonoscopy: Not a candidate for screening due to age ?Recommended yearly ophthalmology/optometry visit for glaucoma screening and checkup ?Recommended yearly dental visit for hygiene and checkup ? ?Vaccinations: ?Influenza vaccine: 01/20/2021 ?Pneumococcal vaccine: 12/02/2014, 05/16/2017 ?Tdap vaccine: 07/02/2019; due every 10 years ?Shingles vaccine: never done   ?Covid-19: 05/02/2019, 05/28/2019 ? ?Advanced directives: Yes; Please bring a copy of your health care power of attorney and living will to the office at your convenience. ? ?Conditions/risks identified: Yes ? ?Next appointment: Please schedule your next Medicare Wellness Visit with your Nurse Health Advisor in 1 year by calling [phone]. ? ?Preventive Care 35 Years and Older, Male ?Preventive care refers to lifestyle choices and visits with your health care provider that can promote health and wellness. ?What does preventive care include? ?A yearly physical exam. This is also called an annual well check. ?Dental exams once or twice a year. ?Routine eye exams. Ask your health care provider how often you should have your eyes checked. ?Personal lifestyle choices, including: ?Daily care of your teeth and gums. ?Regular physical activity. ?Eating a healthy diet. ?Avoiding tobacco and drug use. ?Limiting alcohol use. ?Practicing safe sex. ?Taking low doses of aspirin every day. ?Taking vitamin and mineral supplements as recommended by your health care provider. ?What happens during an annual well check? ?The services and screenings done by your health care provider during your annual well check will depend on your age, overall health, lifestyle risk factors, and family history of  disease. ?Counseling  ?Your health care provider may ask you questions about your: ?Alcohol use. ?Tobacco use. ?Drug use. ?Emotional well-being. ?Home and relationship well-being. ?Sexual activity. ?Eating habits. ?History of falls. ?Memory and ability to understand (cognition). ?Work and work Statistician. ?Screening  ?You may have the following tests or measurements: ?Height, weight, and BMI. ?Blood pressure. ?Lipid and cholesterol levels. These may be checked every 5 years, or more frequently if you are over 33 years old. ?Skin check. ?Lung cancer screening. You may have this screening every year starting at age 62 if you have a 30-pack-year history of smoking and currently smoke or have quit within the past 15 years. ?Fecal occult blood test (FOBT) of the stool. You may have this test every year starting at age 73. ?Flexible sigmoidoscopy or colonoscopy. You may have a sigmoidoscopy every 5 years or a colonoscopy every 10 years starting at age 4. ?Prostate cancer screening. Recommendations will vary depending on your family history and other risks. ?Hepatitis C blood test. ?Hepatitis B blood test. ?Sexually transmitted disease (STD) testing. ?Diabetes screening. This is done by checking your blood sugar (glucose) after you have not eaten for a while (fasting). You may have this done every 1-3 years. ?Abdominal aortic aneurysm (AAA) screening. You may need this if you are a current or former smoker. ?Osteoporosis. You may be screened starting at age 41 if you are at high risk. ?Talk with your health care provider about your test results, treatment options, and if necessary, the need for more tests. ?Vaccines  ?Your health care provider may recommend certain vaccines, such as: ?Influenza vaccine. This is recommended every year. ?Tetanus, diphtheria, and acellular pertussis (Tdap, Td) vaccine. You may need a Td booster every 10 years. ?Zoster vaccine. You  may need this after age 25. ?Pneumococcal 13-valent  conjugate (PCV13) vaccine. One dose is recommended after age 88. ?Pneumococcal polysaccharide (PPSV23) vaccine. One dose is recommended after age 81. ?Talk to your health care provider about which screenings and vaccines you need and how often you need them. ?This information is not intended to replace advice given to you by your health care provider. Make sure you discuss any questions you have with your health care provider. ?Document Released: 04/09/2015 Document Revised: 12/01/2015 Document Reviewed: 01/12/2015 ?Elsevier Interactive Patient Education ? 2017 [employer]. ? ?Fall Prevention in the Home ?Falls can cause injuries. They can happen to people of all ages. There are many things you can do to make your home safe and to help prevent falls. ?What can I do on the outside of my home? ?Regularly fix the edges of walkways and driveways and fix any cracks. ?Remove anything that might make you trip as you walk through a door, such as a raised step or threshold. ?Trim any bushes or trees on the path to your home. ?Use bright outdoor lighting. ?Clear any walking paths of anything that might make someone trip, such as rocks or tools. ?Regularly check to see if handrails are loose or broken. Make sure that both sides of any steps have handrails. ?Any raised decks and porches should have guardrails on the edges. ?Have any leaves, snow, or ice cleared regularly. ?Use sand or salt on walking paths during winter. ?Clean up any spills in your garage right away. This includes oil or grease spills. ?What can I do in the bathroom? ?Use night lights. ?Install grab bars by the toilet and in the tub and shower. Do not use towel bars as grab bars. ?Use non-skid mats or decals in the tub or shower. ?If you need to sit down in the shower, use a plastic, non-slip stool. ?Keep the floor dry. Clean up any water that spills on the floor as soon as it happens. ?Remove soap buildup in the tub or shower regularly. ?Attach bath mats  securely with double-sided non-slip rug tape. ?Do not have throw rugs and other things on the floor that can make you trip. ?What can I do in the bedroom? ?Use night lights. ?Make sure that you have a light by your bed that is easy to reach. ?Do not use any sheets or blankets that are too big for your bed. They should not hang down onto the floor. ?Have a firm chair that has side arms. You can use this for support while you get dressed. ?Do not have throw rugs and other things on the floor that can make you trip. ?What can I do in the kitchen? ?Clean up any spills right away. ?Avoid walking on wet floors. ?Keep items that you use a lot in easy-to-reach places. ?If you need to reach something above you, use a strong step stool that has a grab bar. ?Keep electrical cords out of the way. ?Do not use floor polish or wax that makes floors slippery. If you must use wax, use non-skid floor wax. ?Do not have throw rugs and other things on the floor that can make you trip. ?What can I do with my stairs? ?Do not leave any items on the stairs. ?Make sure that there are handrails on both sides of the stairs and use them. Fix handrails that are broken or loose. Make sure that handrails are as long as the stairways. ?Check any carpeting to make sure that it is firmly  attached to the stairs. Fix any carpet that is loose or worn. ?Avoid having throw rugs at the top or bottom of the stairs. If you do have throw rugs, attach them to the floor with carpet tape. ?Make sure that you have a light switch at the top of the stairs and the bottom of the stairs. If you do not have them, ask someone to add them for you. ?What else can I do to help prevent falls? ?Wear shoes that: ?Do not have high heels. ?Have rubber bottoms. ?Are comfortable and fit you well. ?Are closed at the toe. Do not wear sandals. ?If you use a stepladder: ?Make sure that it is fully opened. Do not climb a closed stepladder. ?Make sure that both sides of the stepladder  are locked into place. ?Ask someone to hold it for you, if possible. ?Clearly mark and make sure that you can see: ?Any grab bars or handrails. ?First and last steps. ?Where the edge of each step is. ?Use tools that help

## 2021-07-20 NOTE — Patient Instructions (Signed)
Inguinal Hernia, Adult An inguinal hernia develops when fat or the intestines push through a weak spot in a muscle where the leg meets the lower abdomen (groin). This creates a bulge. This kind of hernia could also be: In the scrotum, if you are male. In folds of skin around the vagina, if you are male. There are three types of inguinal hernias: Hernias that can be pushed back into the abdomen (are reducible). This type rarely causes pain. Hernias that are not reducible (are incarcerated). Hernias that are not reducible and lose their blood supply (are strangulated). This type of hernia requires emergency surgery. What are the causes? This condition is caused by having a weak spot in the muscles or tissues in your groin. This develops over time. The hernia may poke through the weak spot when you suddenly strain your lower abdominal muscles, such as when you: Lift a heavy object. Strain to have a bowel movement. Constipation can lead to straining. Cough. What increases the risk? This condition is more likely to develop in: Males. Pregnant females. People who: Are overweight. Work in jobs that require long periods of standing or heavy lifting. Have had an inguinal hernia before. Smoke or have lung disease. These factors can lead to long-term (chronic) coughing. What are the signs or symptoms? Symptoms may depend on the size of the hernia. Often, a small inguinal hernia has no symptoms. Symptoms of a larger hernia may include: A bulge in the groin area. This is easier to see when standing. It might not be visible when lying down. Pain or burning in the groin. This may get worse when lifting, straining, or coughing. A dull ache or a feeling of pressure in the groin. An unusual bulge in the scrotum, in males. Symptoms of a strangulated inguinal hernia may include: A bulge in your groin that is very painful and tender to the touch. A bulge that turns red or purple. Fever, nausea, and  vomiting. Inability to have a bowel movement or to pass gas. How is this diagnosed? This condition is diagnosed based on your symptoms, your medical history, and a physical exam. Your health care provider may feel your groin area and ask you to cough. How is this treated? Treatment depends on the size of your hernia and whether you have symptoms. If you do not have symptoms, your health care provider may have you watch your hernia carefully and have you come in for follow-up visits. If your hernia is large or if you have symptoms, you may need surgery to repair the hernia. Follow these instructions at home: Lifestyle Avoid lifting heavy objects. Avoid standing for long periods of time. Do not use any products that contain nicotine or tobacco. These products include cigarettes, chewing tobacco, and vaping devices, such as e-cigarettes. If you need help quitting, ask your health care provider. Maintain a healthy weight. Preventing constipation You may need to take these actions to prevent or treat constipation: Drink enough fluid to keep your urine pale yellow. Take over-the-counter or prescription medicines. Eat foods that are high in fiber, such as beans, whole grains, and fresh fruits and vegetables. Limit foods that are high in fat and processed sugars, such as fried or sweet foods. General instructions You may try to push the hernia back in place by very gently pressing on it while lying down. Do not try to force the bulge back in if it will not push in easily. Watch your hernia for any changes in shape, size, or   color. Get help right away if you notice any changes. Take over-the-counter and prescription medicines only as told by your health care provider. Keep all follow-up visits. This is important. Contact a health care provider if: You have a fever or chills. You develop new symptoms. Your symptoms get worse. Get help right away if: You have pain in your groin that suddenly gets  worse. You have a bulge in your groin that: Suddenly gets bigger and does not get smaller. Becomes red or purple or painful to the touch. You are a man and you have a sudden pain in your scrotum, or the size of your scrotum suddenly changes. You cannot push the hernia back in place by very gently pressing on it when you are lying down. You have nausea or vomiting that does not go away. You have a fast heartbeat. You cannot have a bowel movement or pass gas. These symptoms may represent a serious problem that is an emergency. Do not wait to see if the symptoms will go away. Get medical help right away. Call your local emergency services (911 in the [place] Summary An inguinal hernia develops when fat or the intestines push through a weak spot in a muscle where your leg meets your lower abdomen (groin). This condition is caused by having a weak spot in muscles or tissues in your groin. Symptoms may depend on the size of the hernia, and they may include pain or swelling in your groin. A small inguinal hernia often has no symptoms. Treatment may not be needed if you do not have symptoms. If you have symptoms or a large hernia, you may need surgery to repair the hernia. Avoid lifting heavy objects. Also, avoid standing for long periods of time. This information is not intended to replace advice given to you by your health care provider. Make sure you discuss any questions you have with your health care provider. Document Revised: 11/11/2019 Document Reviewed: 11/11/2019 Elsevier Patient Education  2023 Elsevier Inc.  

## 2021-07-20 NOTE — Progress Notes (Signed)
? ?Subjective:  ?Patient ID: Jerry Palmer, male    DOB: December 12, 1945  Age: 76 y.o. MRN: [id number] ? ?CC: Follow-up ? ? ?HPI ?Jerry Palmer presents for f/up -  ? ?He has noticed a bulge in his right groin whenever he coughs or sneezes.  It is not painful. ? ?Outpatient Medications Prior to Visit  ?Medication Sig Dispense Refill  ? aspirin EC 81 MG tablet Take 1 tablet (81 mg total) by mouth daily. 90 tablet 3  ? Blood Glucose Monitoring Suppl (CONTOUR NEXT MONITOR) w/Device KIT 1 Act by Does not apply route 2 (two) times a day. 2 kit 0  ? clonazePAM (KLONOPIN) 0.5 MG tablet Take by mouth.    ? doxycycline (VIBRA-TABS) 100 MG tablet Take 1 tablet (100 mg total) by mouth 2 (two) times daily. 60 tablet 1  ? glucose blood (CONTOUR NEXT TEST) test strip USE TO CHECK SUGAR TWICE A DAY. WHAT METER HAVE 200 strip 2  ? levocetirizine (XYZAL) 5 MG tablet Take 1 tablet (5 mg total) by mouth every evening. 90 tablet 1  ? Nutritional Supplements (FEEDING SUPPLEMENT, JEVITY 1.5 CAL/FIBER,) LIQD Begin Jevity 1.5 or equivalent via PEG,  1 carton six feeds per day with 60 ml FWF before and 120 ml FWF after feeds  ?Provides 2130 kcals, 91 grams of protein, 2160 mL H2O/provides 100% estimated energy needs ?Please send formula and supplies 1422 mL 6  ? ondansetron (ZOFRAN) 4 MG tablet Take 1 tablet (4 mg total) by mouth every 4 (four) hours as needed for nausea. 90 tablet 3  ? oxyCODONE (OXY IR/ROXICODONE) 5 MG immediate release tablet Take 1 tablet (5 mg total) by mouth every 4 (four) hours as needed for severe pain. 120 tablet 0  ? prochlorperazine (COMPAZINE) 10 MG tablet Take 1 tablet (10 mg total) by mouth every 6 (six) hours as needed for nausea or vomiting. 90 tablet 3  ? rosuvastatin (CRESTOR) 20 MG tablet TAKE 1 TABLET BY MOUTH EVERY DAY 90 tablet 1  ? SYNJARDY 12.5-500 MG TABS TAKE 1 TABLET BY MOUTH TWICE A DAY 180 tablet 0  ? potassium chloride SA (KLOR-CON M) 20 MEQ tablet Take 1 tablet (20 mEq total) by mouth daily for  5 days. 5 tablet 0  ? ?No facility-administered medications prior to visit.  ? ? ?ROS ?Review of Systems  ?Constitutional: Negative.   ?HENT:  Positive for voice change. Negative for trouble swallowing.   ?Eyes: Negative.   ?Respiratory: Negative.  Negative for cough, chest tightness, shortness of breath and wheezing.   ?Cardiovascular:  Negative for chest pain, palpitations and leg swelling.  ?Gastrointestinal:  Negative for abdominal pain, constipation, diarrhea, nausea and vomiting.  ?Endocrine: Negative.   ?Genitourinary: Negative.   ?Musculoskeletal: Negative.   ?Skin: Negative.   ?Neurological:  Negative for dizziness and weakness.  ?Hematological:  Negative for adenopathy. Does not bruise/bleed easily.  ?Psychiatric/Behavioral: Negative.    ? ?Objective:  ?BP 110/70   Pulse 74   Temp 98 ?F (36.7 ?C) (Oral)   Ht _0  (1.727 m)   Wt 134 lb (60.8 kg)   SpO2 94%   BMI 20.37 kg/m?  ? ?BP Readings from Last 3 Encounters:  ?07/20/21 110/70  ?07/20/21 110/70  ?05/31/21 (!) 146/84  ? ? ?Wt Readings from Last 3 Encounters:  ?07/20/21 134 lb (60.8 kg)  ?07/20/21 134 lb 6.4 oz (61 kg)  ?05/31/21 137 lb 1.6 oz (62.2 kg)  ? ? ?Physical Exam ?Vitals reviewed. Exam conducted with  a chaperone present (his wife).  ?HENT:  ?   Nose: Nose normal.  ?   Mouth/Throat:  ?   Mouth: Mucous membranes are moist.  ?Eyes:  ?   General: No scleral icterus. ?   Conjunctiva/sclera: Conjunctivae normal.  ?Neck:  ?   Comments: ++ tracheostomy ?Cardiovascular:  ?   Rate and Rhythm: Normal rate and regular rhythm.  ?   Heart sounds: No murmur heard. ?Pulmonary:  ?   Effort: Pulmonary effort is normal.  ?   Breath sounds: No stridor. No wheezing, rhonchi or rales.  ?Abdominal:  ?   General: Abdomen is flat.  ?   Palpations: There is no mass.  ?   Tenderness: There is no abdominal tenderness. There is no guarding.  ?   Hernia: A hernia is present. Hernia is present in the right inguinal area. There is no hernia in the left inguinal area.   ?Genitourinary: ?   Penis: Normal and circumcised.   ?   Testes: Normal.  ?   Epididymis:  ?   Right: Normal. No mass.  ?   Left: Normal. No mass.  ?   Comments: Direct right inguinal hernia that protrudes with Valsalva and is easily reduced. ?Musculoskeletal:  ?   Cervical back: Neck supple.  ?Lymphadenopathy:  ?   Cervical: No cervical adenopathy.  ?   Lower Body: No right inguinal adenopathy. No left inguinal adenopathy.  ?Neurological:  ?   Mental Status: He is alert.  ? ? ?Lab Results  ?Component Value Date  ? WBC 5.6 05/31/2021  ? HGB 14.8 05/31/2021  ? HCT 46 05/31/2021  ? PLT 148 (A) 05/31/2021  ? GLUCOSE 95 01/11/2021  ? CHOL 118 06/02/2020  ? TRIG 126.0 06/02/2020  ? HDL 37.60 (L) 06/02/2020  ? LDLDIRECT 157.4 12/30/2009  ? LDLCALC 56 06/02/2020  ? ALT 23 05/31/2021  ? AST 27 05/31/2021  ? NA 140 05/31/2021  ? K 3.9 05/31/2021  ? CL 106 05/31/2021  ? CREATININE 0.6 05/31/2021  ? BUN 17 05/31/2021  ? CO2 28 (A) 05/31/2021  ? TSH 2.939 05/31/2021  ? PSA 2.53 11/10/2020  ? HGBA1C 6.5 11/10/2020  ? MICROALBUR 1.0 06/02/2020  ? ? ?CT SOFT TISSUE NECK W CONTRAST ? ?Result Date: 02/03/2021 ?CLINICAL DATA:  Vocal cord mass, and new diagnosis. EXAM: CT NECK WITH CONTRAST TECHNIQUE: Multidetector CT imaging of the neck was performed using the standard protocol following the bolus administration of intravenous contrast. CONTRAST:  77m ISOVUE-300 IOPAMIDOL (ISOVUE-300) INJECTION 61% COMPARISON:  None. FINDINGS: Pharynx and larynx: Left laryngeal mass at the level of the glottis and supraglottic larynx with tumor measuring approximately 22 x 29 mm on axial images and extending from the anterior commissure to posterior commissure. There is left paraglottic fat invasion. No cartilage transgression. The left arytenoid is sclerotic and smaller than the right. Small internal laryngocele on the left. Salivary glands: No inflammation, mass, or stone. Thyroid: Normal. Lymph nodes: None enlarged or abnormal density. Vascular:  Scattered atheromatous calcification Limited intracranial: Negative Visualized orbits: Bilateral cataract resection Mastoids and visualized paranasal sinuses: Clear Skeleton: Generalized cervical spine degeneration. Upper chest: Azygous fissure.  No nodules. 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 No adenopathy. Electronically Signed   By: JJorje GuildM.D.   On: 02/03/2021 04:47  ? ? ?Assessment & Plan:  ? ?RIlijawas seen today for follow-up. ? ?Diagnoses and all  orders for this visit: ? ?Reducible right inguinal hernia ?-     Ambulatory referral to General Surgery ? ? ?I am having Nicolai L. Brozek maintain his aspirin EC, Contour Next Monitor, levocetirizine, Contour Next Test, clonazePAM, ondansetron, prochlorperazine, Synjardy, doxycycline, potassium chloride SA, feeding supplement (JEVITY 1.5 CAL/FIBER), oxyCODONE, and rosuvastatin. ? ?No orders of the defined types were placed in this encounter. ? ? ? ?Follow-up: Return in about 3 months (around 10/19/2021). ? ?Scarlette Calico, MD ?

## 2021-07-20 NOTE — Progress Notes (Signed)
? ?Subjective:  ? FRANZ SVEC is a 76 y.o. male who presents for Medicare Annual/Subsequent preventive examination. ? ?Review of Systems    ? ?Cardiac Risk Factors include: advanced age (>54mn, >>83women);diabetes mellitus;hypertension;dyslipidemia;family history of premature cardiovascular disease;male gender ? ?   ?Objective:  ?  ?Today's Vitals  ? 07/20/21 1421  ?BP: 110/70  ?Pulse: 74  ?Temp: 98 ?F (36.7 ?C)  ?SpO2: 94%  ?Weight: 134 lb 6.4 oz (61 kg)  ?Height: _0  (1.727 m)  ?PainSc: 0-No pain  ? ?Body mass index is 20.44 kg/m?. ? ? ?  07/20/2021  ?  2:59 PM 04/19/2021  ?  1:47 PM 03/29/2021  ?  3:39 PM 03/14/2021  ? 11:57 AM 03/07/2021  ?  1:58 PM 02/03/2021  ?  4:17 PM 01/14/2021  ?  6:35 AM  ?Advanced Directives  ?Does Patient Have a Medical Advance Directive? _1  Yes Yes  ?Type of Advance Directive Living will        ?Does patient want to make changes to medical advance directive? No - Patient declined      No - Patient declined  ?Would patient like information on creating a medical advance directive?       No - Patient declined  ? ? ?Current Medications (verified) ?Outpatient Encounter Medications as of 07/20/2021  ?Medication Sig  ? Blood Glucose Monitoring Suppl (CONTOUR NEXT MONITOR) w/Device KIT 1 Act by Does not apply route 2 (two) times a day.  ? glucose blood (CONTOUR NEXT TEST) test strip USE TO CHECK SUGAR TWICE A DAY. WHAT METER HAVE  ? rosuvastatin (CRESTOR) 20 MG tablet TAKE 1 TABLET BY MOUTH EVERY DAY  ? SYNJARDY 12.5-500 MG TABS TAKE 1 TABLET BY MOUTH TWICE A DAY  ? aspirin EC 81 MG tablet Take 1 tablet (81 mg total) by mouth daily.  ? clonazePAM (KLONOPIN) 0.5 MG tablet Take by mouth.  ? doxycycline (VIBRA-TABS) 100 MG tablet Take 1 tablet (100 mg total) by mouth 2 (two) times daily.  ? levocetirizine (XYZAL) 5 MG tablet Take 1 tablet (5 mg total) by mouth every evening.  ? Nutritional Supplements (FEEDING SUPPLEMENT, JEVITY 1.5 CAL/FIBER,) LIQD Begin Jevity 1.5 or  equivalent via PEG,  1 carton six feeds per day with 60 ml FWF before and 120 ml FWF after feeds  ?Provides 2130 kcals, 91 grams of protein, 2160 mL H2O/provides 100% estimated energy needs ?Please send formula and supplies  ? ondansetron (ZOFRAN) 4 MG tablet Take 1 tablet (4 mg total) by mouth every 4 (four) hours as needed for nausea.  ? oxyCODONE (OXY IR/ROXICODONE) 5 MG immediate release tablet Take 1 tablet (5 mg total) by mouth every 4 (four) hours as needed for severe pain.  ? potassium chloride SA (KLOR-CON M) 20 MEQ tablet Take 1 tablet (20 mEq total) by mouth daily for 5 days.  ? prochlorperazine (COMPAZINE) 10 MG tablet Take 1 tablet (10 mg total) by mouth every 6 (six) hours as needed for nausea or vomiting.  ? ?No facility-administered encounter medications on file as of 07/20/2021.  ? ? ?Allergies (verified) ?Angiotensin receptor blockers, Lisinopril, Bactrim [sulfamethoxazole-trimethoprim], and Cetuximab  ? ?History: ?Past Medical History:  ?Diagnosis Date  ? Cancer ([facility]   ? Basal Cell  ? Chronic kidney disease   ? CKD stg 3  ? [place]   ? Diverticulosis of colon   ? Fasting hyperglycemia   ? Hematuria   ? PMH of  ?  Hyperlipidemia   ? Hypertension   ? no meds now  ? Vocal cord mass   ? ?Past Surgical History:  ?Procedure Laterality Date  ? CATARACT EXTRACTION  04/06/14  ? right eye  ? cataract left eye  1995  ? COLONOSCOPY W/ POLYPECTOMY    ?  polyp x 1; negative X 2;Dr Sharlett Iles  ? cyst scalp  2005  ? IR GASTROSTOMY TUBE REMOVAL  06/27/2021  ? MICROLARYNGOSCOPY N/A 01/14/2021  ? Procedure: MICROLARYNGOSCOPY WITH BIOPSY OF VOCAL CORD MASS;  Surgeon: Leta Baptist, MD;  Location: [facility];  Service: ENT;  Laterality: N/A;  ? ?Family History  ?Problem Relation Age of Onset  ? Cancer Brother   ?     ?spinal mets  ? Colon cancer Brother 20  ? Colon cancer Brother 89  ? Colon cancer Brother 4  ? Breast cancer Mother   ? Hypertension Mother   ? Diabetes Sister    ? Stroke Maternal Grandfather   ?      > 55  ? Heart disease Neg Hx   ? ?Social History  ? ?Socioeconomic History  ? Marital status: Married  ?  Spouse name: Not on file  ? Number of children: 0  ? Years of education: 8  ? Highest education level: Not on file  ?Occupational History  ? Not on file  ?Tobacco Use  ? Smoking status: Former  ?  Types: Cigarettes  ?  Quit date: 03/27/1978  ?  Years since quitting: 43.3  ? Smokeless tobacco: Never  ? Tobacco comments:  ?  smoked [place], up to 1.5 ppd   ?Vaping Use  ? Vaping Use: Never used  ?Substance and Sexual Activity  ? Alcohol use: No  ?  Alcohol/week: 0.0 standard drinks  ? Drug use: No  ? Sexual activity: Not on file  ?Other Topics Concern  ? Not on file  ?Social History Narrative  ? Fun: Camping   ? ?Social Determinants of Health  ? ?Financial Resource Strain: Low Risk   ? Difficulty of Paying Living Expenses: Not hard at all  ?Food Insecurity: No Food Insecurity  ? Worried About Charity fundraiser in the Last Year: Never true  ? Ran Out of Food in the Last Year: Never true  ?Transportation Needs: No Transportation Needs  ? Lack of Transportation (Medical): No  ? Lack of Transportation (Non-Medical): No  ?Physical Activity: Inactive  ? Days of Exercise per Week: 0 days  ? Minutes of Exercise per Session: 0 min  ?Stress: No Stress Concern Present  ? Feeling of Stress : Not at all  ?Social Connections: Socially Integrated  ? Frequency of Communication with Friends and Family: More than three times a week  ? Frequency of Social Gatherings with Friends and Family: More than three times a week  ? Attends Religious Services: More than 4 times per year  ? Active Member of Clubs or Organizations: No  ? Attends Archivist Meetings: More than 4 times per year  ? Marital Status: Married  ? ? ?Tobacco Counseling ?Counseling given: Not Answered ?Tobacco comments: smoked [place], up to 1.5 ppd  ? ? ?Clinical Intake: ? ?Pre-visit preparation completed:  Yes ? ?Pain : No/denies pain ?Pain Score: 0-No pain ? ?  ? ?BMI - recorded: 20.44 ?Nutritional Status: BMI of 19-24  Normal ?Nutritional Risks: None ?Diabetes: Yes ?CBG done?: No ?Did pt. bring in CBG monitor from home?: No ? ?How often do you  need to have someone help you when you read instructions, pamphlets, or other written materials from your doctor or pharmacy?: 1 - Never ?What is the last grade level you completed in school?: 8th grade ? ?Diabetic? yes ? ?Interpreter Needed?: No ? ?Information entered by :: Lisette Abu, LPN ? ? ?Activities of Daily Living ? ?  07/20/2021  ?  3:01 PM 01/14/2021  ?  6:34 AM  ?In your present state of health, do you have any difficulty performing the following activities:  ?Hearing? 0 0  ?Vision? 0 0  ?Difficulty concentrating or making decisions? 0 0  ?Walking or climbing stairs? 0 0  ?Dressing or bathing? 0 0  ?Doing errands, shopping? 0   ?Preparing Food and eating ? N   ?Using the Toilet? N   ?In the past six months, have you accidently leaked urine? N   ?Do you have problems with loss of bowel control? N   ?Managing your Medications? N   ?Managing your Finances? N   ?Housekeeping or managing your Housekeeping? N   ? ? ?Patient Care Team: ?Janith Lima, MD as PCP - General (Internal Medicine) ?Raylene Miyamoto, MD as Referring Physician (Otolaryngology) ?Marice Potter, MD as Consulting Physician (Oncology) ?Pa, [place] as Consulting Physician (Optometry) ? ?Indicate any recent Medical Services you may have received from other than [facility] providers in the past year (date may be approximate). ? ?   ?Assessment:  ? This is a routine wellness examination for Sota. ? ?Hearing/Vision screen ?Hearing Screening - Comments:: Patient denied any hearing difficulty.   ?No hearing aids. ? ?Vision Screening - Comments:: Patient wears readers for fine print; had cataracts removed. ?Eye exam done by: [facility] ? ? ?Dietary issues and exercise activities  discussed: ?Current Exercise Habits: The patient does not participate in regular exercise at present, Exercise limited by: Other - see comments (Just completed Radiation & Chemo Therapy) ? ? Goals Addressed   ? ?  ?  ?  ?  ?

## 2021-07-27 ENCOUNTER — Other Ambulatory Visit: Payer: Self-pay | Admitting: Oncology

## 2021-07-27 DIAGNOSIS — K449 Diaphragmatic hernia without obstruction or gangrene: Secondary | ICD-10-CM | POA: Diagnosis not present

## 2021-07-27 DIAGNOSIS — C329 Malignant neoplasm of larynx, unspecified: Secondary | ICD-10-CM | POA: Diagnosis not present

## 2021-07-27 DIAGNOSIS — I358 Other nonrheumatic aortic valve disorders: Secondary | ICD-10-CM | POA: Diagnosis not present

## 2021-07-27 DIAGNOSIS — I080 Rheumatic disorders of both mitral and aortic valves: Secondary | ICD-10-CM | POA: Diagnosis not present

## 2021-07-27 DIAGNOSIS — C320 Malignant neoplasm of glottis: Secondary | ICD-10-CM | POA: Diagnosis not present

## 2021-07-27 DIAGNOSIS — I700 Atherosclerosis of aorta: Secondary | ICD-10-CM | POA: Diagnosis not present

## 2021-07-27 DIAGNOSIS — I2510 Atherosclerotic heart disease of native coronary artery without angina pectoris: Secondary | ICD-10-CM | POA: Diagnosis not present

## 2021-07-27 DIAGNOSIS — I7143 Infrarenal abdominal aortic aneurysm, without rupture: Secondary | ICD-10-CM | POA: Diagnosis not present

## 2021-07-27 NOTE — Progress Notes (Signed)
Being seen ?[address]  ?[address] ?[place],  [place]  [address] ?[phone] ? ?Clinic Day:  05/31/2021 ? ?Referring physician: Janith Lima, MD ? ?HISTORY OF PRESENT ILLNESS:  ?The patient is a 76 y.o. male with stage III (T1 N1 M0) laryngeal cancer.  He completed his definitive therapy, which consisted of weekly cetuximab/daily radiation, approximately 3 months ago.  He comes in today to go over his PET scan to ascertain his new disease baseline.  Since his last visit, the patient has been doing fairly well.  He still has a degree of hoarseness, but claims it has gotten better.  Overall, the patient feels much better.  He denies having any new symptoms or findings which concern him with disease recurrence. ? ?PHYSICAL EXAM:  ?Blood pressure (!) 157/80, pulse 80, temperature 98.8 ?F (37.1 ?C), resp. rate 16, height '5\' 8"'$  (1.727 m), weight 137 lb 3.2 oz (62.2 kg), SpO2 100 %. ?Wt Readings from Last 3 Encounters:  ?07/28/21 137 lb 3.2 oz (62.2 kg)  ?07/20/21 134 lb (60.8 kg)  ?07/20/21 134 lb 6.4 oz (61 kg)  ? ?Body mass index is 20.86 kg/m?Marland Kitchen ?Performance status (ECOG): 1 - Symptomatic but completely ambulatory ?Physical Exam ?Constitutional:   ?   Appearance: Normal appearance. He is not ill-appearing.  ?HENT:  ?   Mouth/Throat:  ?   Mouth: Mucous membranes are moist.  ?   Pharynx: Oropharynx is clear. No oropharyngeal exudate or posterior oropharyngeal erythema.  ?Cardiovascular:  ?   Rate and Rhythm: Normal rate and regular rhythm.  ?   Heart sounds: No murmur heard. ?  No friction rub. No gallop.  ?Pulmonary:  ?   Effort: Pulmonary effort is normal. No respiratory distress.  ?   Breath sounds: Normal breath sounds. No wheezing, rhonchi or rales.  ?Abdominal:  ?   General: Bowel sounds are normal. There is no distension.  ?   Palpations: Abdomen is soft. There is no mass.  ?   Tenderness: There is no abdominal tenderness.  ?Musculoskeletal:     ?   General: No  swelling.  ?   Right lower leg: No edema.  ?   Left lower leg: No edema.  ?Lymphadenopathy:  ?   Cervical: No cervical adenopathy.  ?   Upper Body:  ?   Right upper body: No supraclavicular or axillary adenopathy.  ?   Left upper body: No supraclavicular or axillary adenopathy.  ?   Lower Body: No right inguinal adenopathy. No left inguinal adenopathy.  ?Skin: ?   General: Skin is warm.  ?   Coloration: Skin is not jaundiced.  ?   Findings: No acne (complete resolution of cetuximab-induced acne), erythema (facial), lesion or rash.  ?Neurological:  ?   General: No focal deficit present.  ?   Mental Status: He is alert and oriented to person, place, and time. Mental status is at baseline.  ?Psychiatric:     ?   Mood and Affect: Mood normal.     ?   Behavior: Behavior normal.     ?   Thought Content: Thought content normal.  ? ?SCANS: His PET scan revealed the following: ?FINDINGS: ?Mediastinal blood pool activity: SUV max 2.0 ? ?Liver activity: SUV max NA ? ?NECK: Left level IIa lymph node 0.4 cm in short axis on image 72 ?series 3 with maximum SUV 2.2, formerly 0.6 cm with maximum SUV of ?3.7. ? ?Substantial reduction in size of the  left glottic mass, with some ?persistent indistinct residual soft tissue density and maximum SUV ?of 7.1 (formerly 21.0). Contralateral right larynx has maximum SUV ?of about 6.0. No new pathologic adenopathy in the neck. ? ?Incidental CT findings: Bilateral common carotid atherosclerotic ?calcifications. There is some reduced distinctness of tissue planes ?in the neck especially vertical level of the larynx, probably from ?local radiation therapy. ? ?CHEST: No significant abnormal hypermetabolic activity in this ?region. ? ?Incidental CT findings: Right Port-A-Cath tip: Lower SVC. Coronary, ?aortic arch, and branch vessel atherosclerotic vascular disease. ?Mitral and aortic valve calcifications. Small type 1 hiatal hernia. ?Old granulomatous disease. Biapical pleuroparenchymal  scarring. ?Incidental azygous fissure. 3 mm right lower lobe subpleural lymph ?node along the major fissure on image 127 series 3, not appreciably ?hypermetabolic. ? ?ABDOMEN/PELVIS: Scattered bowel activity without CT correlate, ?likely physiologic. Prostatomegaly without obvious focal prostate ?activity. ? ?Incidental CT findings: Atherosclerosis is present, including ?aortoiliac atherosclerotic disease. Focal abdominal aortic aneurysm ?eccentric to the left in the infrarenal position, previously ?thrombosed on 02/07/2021, at the level of the aneurysm the aortic ?diameter is 2.7 as compares to 1.8 cm just above the aneurysm. ?Prominent stool throughout the colon favors constipation. ?Prostatomegaly. ? ?SKELETON: No significant abnormal hypermetabolic activity in this ?region. ? ?Incidental CT findings: none ? ?IMPRESSION: ?1. Marked reduction in size and activity of the left glottic mass, ?with only subtle asymmetry of the left focal cord compared to the ?right. Maximum SUV of the left vocal cord is 7.1 (previously 21.0), ?the contralateral right vocal cord has maximum SUV of about 6.0. ?2. Prior left level IIa lymph node is reduced in size and previous ?accentuated activity has resolved. ?3. Focal infrarenal abdominal aortic aneurysm of the left side of ?the abdominal aorta, about 1.1 cm in diameter. Stable in size from ?02/07/2021, where this finding was thrombosed. Recommend follow-up ?ultrasound every 5 years. This recommendation follows ACR consensus ?guidelines: White Paper of the ACR Incidental Findings Committee II ?on Vascular Findings. J Am Coll Radiol 2013; 10:789-794. ?4. Other imaging findings of potential clinical significance: Aortic ?Atherosclerosis (ICD10-I70.0). Coronary and systemic ?atherosclerosis. Mitral and aortic valve calcifications. Small type ?1 hiatal hernia. Prominent stool throughout the colon favors ?constipation. Prostatomegaly. ? ?ASSESSMENT & PLAN:  ?A 76 y.o. male with stage III  (T1 N1 M0) laryngeal cancer, who finished his cetuximab/radiation in February 2023.  In clinic today, I went over his PET scan images with him, for which he could see there has been a significant decrease in his disease burden.  The only question I have is if there is residual cancer in his laryngeal area.  His case will be discussed with his ENT physician to determine if biopsies in that area needs to be done to prove if residual disease remains.  Clinically, the patient appears to be doing much better.  I will notify him about how the discussion with ENT went.  Otherwise, I will see him back in 4 months for repeat clinical assessment.  The patient and his wife understand all the plans discussed today and are in agreement with them. ? ?Wyndi Northrup Macarthur Critchley, MD ? ? ? ?  ? ?

## 2021-07-28 ENCOUNTER — Inpatient Hospital Stay: Payer: PPO | Attending: Oncology | Admitting: Oncology

## 2021-07-28 ENCOUNTER — Other Ambulatory Visit: Payer: Self-pay | Admitting: Oncology

## 2021-07-28 ENCOUNTER — Encounter: Payer: Self-pay | Admitting: Oncology

## 2021-07-28 VITALS — BP 157/80 | HR 80 | Temp 98.8°F | Resp 16 | Ht 68.0 in | Wt 137.2 lb

## 2021-07-28 DIAGNOSIS — C329 Malignant neoplasm of larynx, unspecified: Secondary | ICD-10-CM | POA: Diagnosis not present

## 2021-07-29 ENCOUNTER — Telehealth: Payer: Self-pay

## 2021-07-29 NOTE — Telephone Encounter (Addendum)
I notified Jerry Palmer that Dr Orlene Erm is reaching out to ENT surgeon to discuss findings of CT scan. Dr Orlene Erm states he will call her once he gets recommendations from the ENT. Dr Bobby Rumpf is aware of the CT scans as well. ?

## 2021-08-17 DIAGNOSIS — C329 Malignant neoplasm of larynx, unspecified: Secondary | ICD-10-CM | POA: Diagnosis not present

## 2021-08-17 DIAGNOSIS — Z8521 Personal history of malignant neoplasm of larynx: Secondary | ICD-10-CM | POA: Diagnosis not present

## 2021-08-17 DIAGNOSIS — R490 Dysphonia: Secondary | ICD-10-CM | POA: Diagnosis not present

## 2021-08-17 DIAGNOSIS — L598 Other specified disorders of the skin and subcutaneous tissue related to radiation: Secondary | ICD-10-CM | POA: Diagnosis not present

## 2021-08-17 DIAGNOSIS — Z923 Personal history of irradiation: Secondary | ICD-10-CM | POA: Diagnosis not present

## 2021-08-17 DIAGNOSIS — Z87891 Personal history of nicotine dependence: Secondary | ICD-10-CM | POA: Diagnosis not present

## 2021-10-03 DIAGNOSIS — Z66 Do not resuscitate: Secondary | ICD-10-CM | POA: Diagnosis not present

## 2021-10-03 DIAGNOSIS — I499 Cardiac arrhythmia, unspecified: Secondary | ICD-10-CM | POA: Diagnosis not present

## 2021-10-03 DIAGNOSIS — R404 Transient alteration of awareness: Secondary | ICD-10-CM | POA: Diagnosis not present

## 2021-10-03 DIAGNOSIS — R000 Tachycardia, unspecified: Secondary | ICD-10-CM | POA: Diagnosis not present

## 2021-10-03 DIAGNOSIS — Z79899 Other long term (current) drug therapy: Secondary | ICD-10-CM | POA: Diagnosis not present

## 2021-10-03 DIAGNOSIS — I469 Cardiac arrest, cause unspecified: Secondary | ICD-10-CM | POA: Diagnosis not present

## 2021-10-03 DIAGNOSIS — I959 Hypotension, unspecified: Secondary | ICD-10-CM | POA: Diagnosis not present

## 2021-10-25 DEATH — deceased

## 2023-02-08 IMAGING — PT NM PET TUM IMG INITIAL (PI) SKULL BASE T - THIGH
7 series · 25 of 25 positions shown · non-contrast
Comparison: None.

CLINICAL DATA: Initial treatment strategy for verrucous carcinoma
of vocal cord.

EXAM:
NUCLEAR MEDICINE PET SKULL BASE TO THIGH
TECHNIQUE: 7.2 mCi F-18 FDG was injected intravenously. Full-ring PET imaging
was performed from the skull base to thigh after the radiotracer. CT
data was obtained and used for attenuation correction and anatomic
localization.
Fasting blood glucose: 100 mg/dl

[Series 3: pet hn_sk_thigh ac · axial · 5.0mm · 4.07mm/px · z∈[-1349,-377]mm · 6 of 244 slices shown]
[im 1/244]
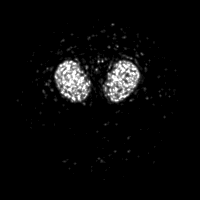
[im 49/244]
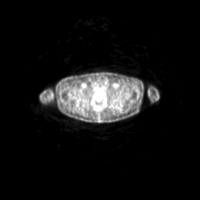
[im 98/244]
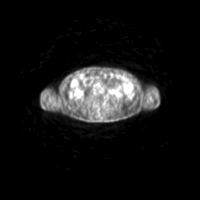
[im 146/244]
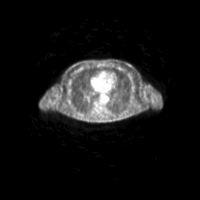
[im 195/244]
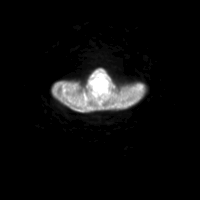
[im 244/244]
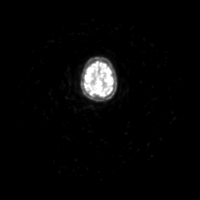

[Series 4: ct hn_sk_th 5.0 bf37 · axial · 5.0mm · 0.98mm/px · z∈[-1349,-377]mm · 5 of 244 slices shown]
[im 1/244]
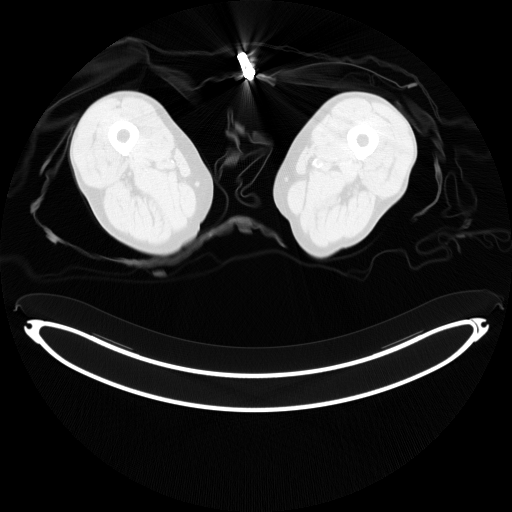
[im 61/244]
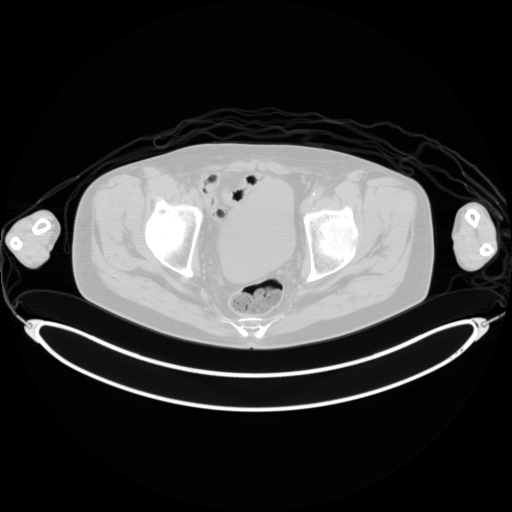
[im 122/244]
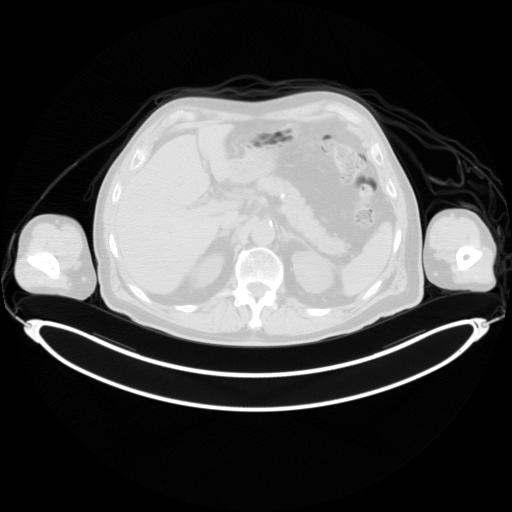
[im 183/244  brain]
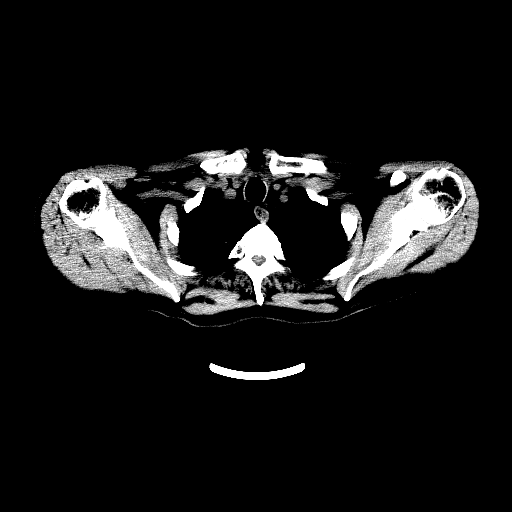
[im 244/244  brain]
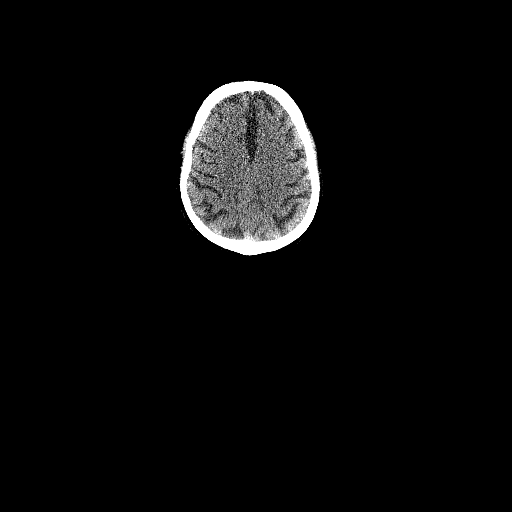

[Series 5: pet hn_sk_thigh nac · axial · 5.0mm · 4.07mm/px · z∈[-1349,-377]mm · 5 of 244 slices shown]
[im 1/244]
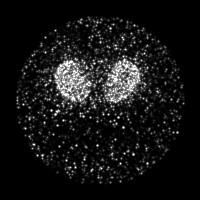
[im 61/244]
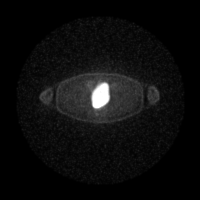
[im 122/244]
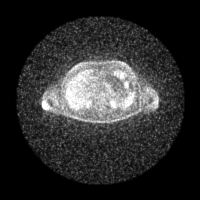
[im 183/244]
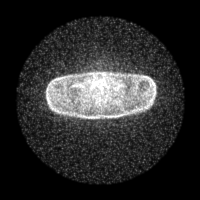
[im 244/244]
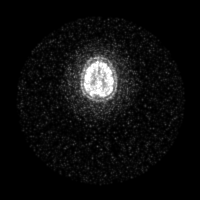

[Series 8: ct hn_sk_th 5.0 br59 lung_bone · axial · 5.0mm · 0.59mm/px · z∈[-861,-585]mm · 2 of 70 slices shown]
[im 1/70]
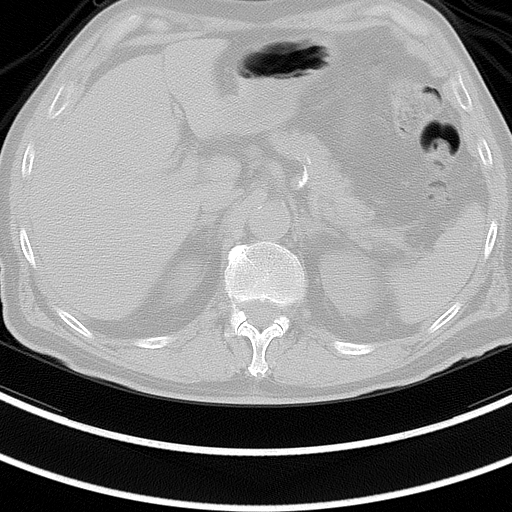
[im 70/70  brain]
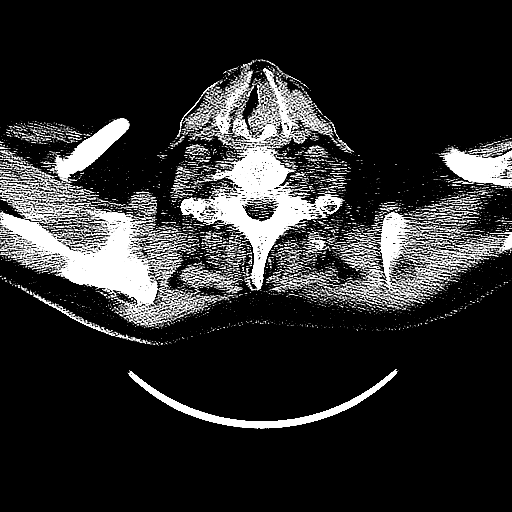

[Series 603: fused cor · 1 of 59 slices shown]
[im 1/59]
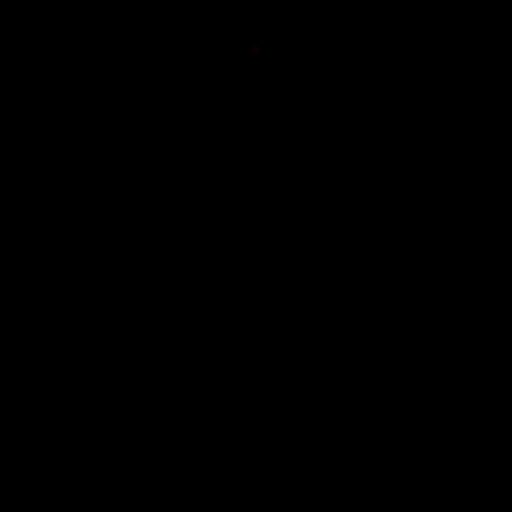

[Series 604: <mip collection> · coronal · 2.02mm/px · 1 of 32 slices shown]
[im 1/32]
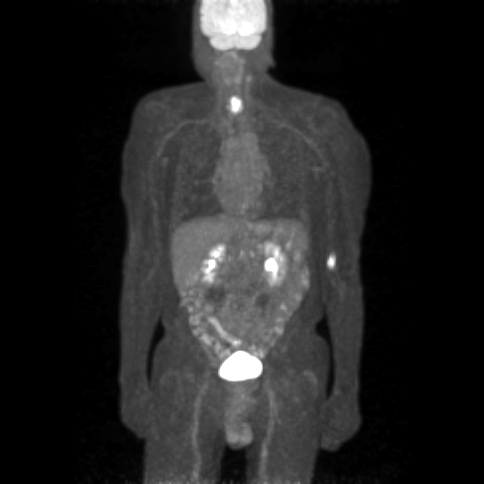

[Series 605: range-ct hn_sk_th 5.0 bf37-tra-<alpha range> · 5 of 238 slices shown]
[im 1/238]
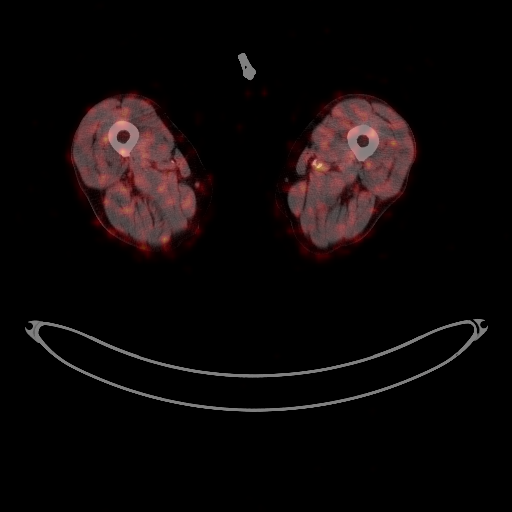
[im 60/238]
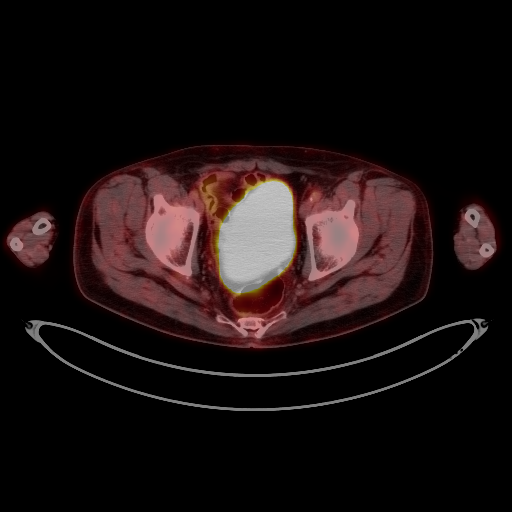
[im 119/238]
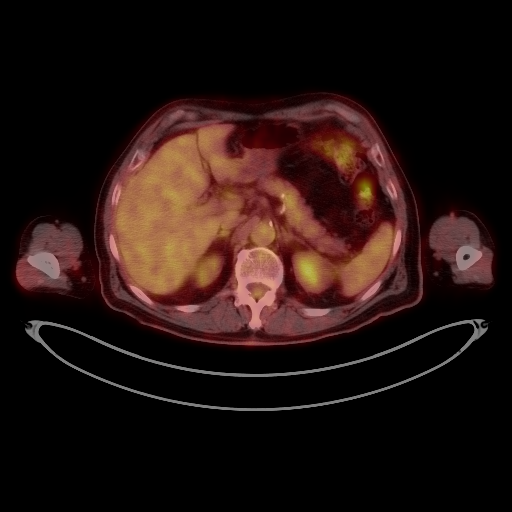
[im 178/238]
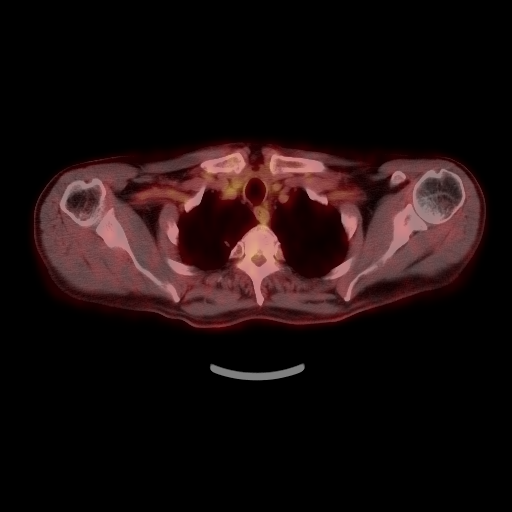
[im 238/238]
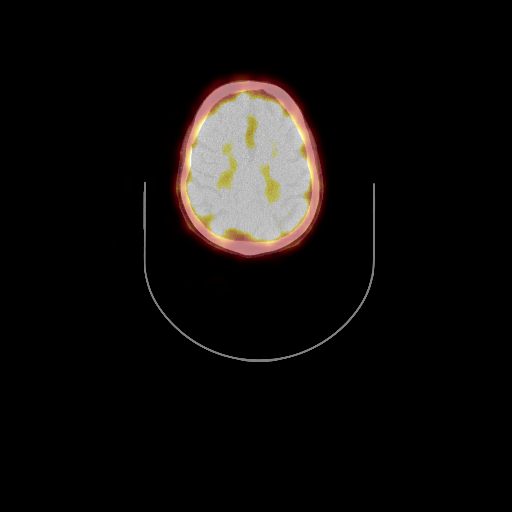

[25 of 25 positions shown; findings below may reference images not displayed]

FINDINGS: Mediastinal blood-pool activity (background): SUV max =

Liver activity (reference): SUV max = N/A

NECK: Irregular soft tissue thickening is seen involving the left
vocal cord, which shows intense hypermetabolic activity, with SUV
max 21.0. A 6 mm left level 2A lymph node (image 40/4) shows
hypermetabolic activity, with SUV max of 3.7. No other
hypermetabolic nodes are seen within the neck.

Incidental CT findings:  None.

CHEST: No hypermetabolic masses or lymphadenopathy. No
hypermetabolic pulmonary nodules seen on CT images. A 3 mm nodule is
seen in the right upper lobe on image 88/4, and a 4 mm nodule is
seen in the anterior right lower lobe on image 90/4 is. Neither of
these show FDG uptake, but they are too small to reliably
characterize by PET.

Incidental CT findings: Aortic and coronary atherosclerotic
calcification noted. Azygous fissure incidentally noted.

ABDOMEN/PELVIS: No abnormal hypermetabolic activity within the
liver, pancreas, adrenal glands, or spleen. No hypermetabolic lymph
nodes in the abdomen or pelvis.

Incidental CT findings: Aortic atherosclerotic calcification noted.
Mild to moderately enlarged prostate.

SKELETON: No focal hypermetabolic bone lesions to suggest skeletal
metastasis.

Incidental CT findings:  None.
IMPRESSION: Hypermetabolic left vocal cord mass, consistent with known primary
carcinoma.

6 mm hypermetabolic left level 2A cervical lymph node, consistent
with metastatic disease.

No definite evidence of metastatic disease within the chest,
abdomen, or pelvis.

Two tiny less than 5 mm right lung nodules show no FDG uptake, but
are too small to characterize by PET. Recommend continued attention
on follow-up CT.
